# Patient Record
Sex: Male | Born: 1965 | Hispanic: No | Marital: Married | Smoking: Former smoker
Health system: Southern US, Community
[De-identification: ages and names within clinical notes are randomized; demographics above are authoritative.]

## PROBLEM LIST (undated history)

## (undated) DIAGNOSIS — Z9989 Dependence on other enabling machines and devices: Secondary | ICD-10-CM

## (undated) DIAGNOSIS — E079 Disorder of thyroid, unspecified: Secondary | ICD-10-CM

## (undated) DIAGNOSIS — I5032 Chronic diastolic (congestive) heart failure: Secondary | ICD-10-CM

## (undated) DIAGNOSIS — Z992 Dependence on renal dialysis: Secondary | ICD-10-CM

## (undated) DIAGNOSIS — E7800 Pure hypercholesterolemia, unspecified: Secondary | ICD-10-CM

## (undated) DIAGNOSIS — F32A Depression, unspecified: Secondary | ICD-10-CM

## (undated) DIAGNOSIS — N289 Disorder of kidney and ureter, unspecified: Secondary | ICD-10-CM

## (undated) DIAGNOSIS — N400 Enlarged prostate without lower urinary tract symptoms: Secondary | ICD-10-CM

## (undated) DIAGNOSIS — I25810 Atherosclerosis of coronary artery bypass graft(s) without angina pectoris: Secondary | ICD-10-CM

## (undated) DIAGNOSIS — I10 Essential (primary) hypertension: Secondary | ICD-10-CM

## (undated) DIAGNOSIS — E119 Type 2 diabetes mellitus without complications: Secondary | ICD-10-CM

## (undated) DIAGNOSIS — F329 Major depressive disorder, single episode, unspecified: Secondary | ICD-10-CM

## (undated) HISTORY — PX: CARDIAC CATHETERIZATION: SHX172

## (undated) HISTORY — PX: DIALYSIS FISTULA CREATION: SHX611

---

## 1998-06-28 ENCOUNTER — Emergency Department (HOSPITAL_COMMUNITY): Admission: EM | Admit: 1998-06-28 | Discharge: 1998-06-28 | Payer: Self-pay | Admitting: Family Medicine

## 2005-01-11 ENCOUNTER — Inpatient Hospital Stay: Payer: Self-pay | Admitting: Internal Medicine

## 2005-04-26 ENCOUNTER — Ambulatory Visit: Payer: Self-pay | Admitting: Nurse Practitioner

## 2007-10-03 ENCOUNTER — Ambulatory Visit: Payer: Self-pay | Admitting: *Deleted

## 2007-10-11 ENCOUNTER — Ambulatory Visit: Payer: Self-pay | Admitting: Internal Medicine

## 2010-07-26 ENCOUNTER — Inpatient Hospital Stay: Payer: Self-pay | Admitting: Internal Medicine

## 2010-11-17 ENCOUNTER — Ambulatory Visit: Payer: Self-pay

## 2010-11-20 ENCOUNTER — Ambulatory Visit: Payer: Self-pay

## 2010-12-17 ENCOUNTER — Ambulatory Visit: Payer: Self-pay

## 2010-12-30 ENCOUNTER — Emergency Department: Payer: Self-pay | Admitting: Emergency Medicine

## 2011-04-13 ENCOUNTER — Emergency Department: Payer: Self-pay | Admitting: Emergency Medicine

## 2011-05-07 ENCOUNTER — Inpatient Hospital Stay: Payer: Self-pay | Admitting: Internal Medicine

## 2011-05-17 ENCOUNTER — Inpatient Hospital Stay: Payer: Self-pay | Admitting: Internal Medicine

## 2011-10-26 ENCOUNTER — Other Ambulatory Visit: Payer: Self-pay | Admitting: *Deleted

## 2011-10-27 ENCOUNTER — Inpatient Hospital Stay: Payer: Self-pay | Admitting: Specialist

## 2011-10-27 LAB — CBC WITH DIFFERENTIAL/PLATELET
Basophil #: 0 10*3/uL (ref 0.0–0.1)
Eosinophil #: 0.9 10*3/uL — ABNORMAL HIGH (ref 0.0–0.7)
Eosinophil %: 7 %
HCT: 31.4 % — ABNORMAL LOW (ref 40.0–52.0)
HGB: 10.5 g/dL — ABNORMAL LOW (ref 13.0–18.0)
Lymphocyte #: 1 10*3/uL (ref 1.0–3.6)
MCH: 31.6 pg (ref 26.0–34.0)
MCV: 95 fL (ref 80–100)
Monocyte #: 0.8 10*3/uL — ABNORMAL HIGH (ref 0.0–0.7)
Neutrophil %: 78.5 %
Platelet: 201 10*3/uL (ref 150–440)
RBC: 3.32 10*6/uL — ABNORMAL LOW (ref 4.40–5.90)
RDW: 17.8 % — ABNORMAL HIGH (ref 11.5–14.5)

## 2011-10-27 LAB — COMPREHENSIVE METABOLIC PANEL
Albumin: 3.6 g/dL (ref 3.4–5.0)
Alkaline Phosphatase: 86 U/L (ref 50–136)
Anion Gap: 18 — ABNORMAL HIGH (ref 7–16)
BUN: 79 mg/dL — ABNORMAL HIGH (ref 7–18)
Bilirubin,Total: 0.8 mg/dL (ref 0.2–1.0)
Creatinine: 10.69 mg/dL — ABNORMAL HIGH (ref 0.60–1.30)
EGFR (African American): 7 — ABNORMAL LOW
EGFR (Non-African Amer.): 6 — ABNORMAL LOW
Glucose: 122 mg/dL — ABNORMAL HIGH (ref 65–99)
Osmolality: 301 (ref 275–301)
SGPT (ALT): 19 U/L
Sodium: 138 mmol/L (ref 136–145)
Total Protein: 8.2 g/dL (ref 6.4–8.2)

## 2011-10-27 LAB — PROTIME-INR: INR: 1.2

## 2011-10-27 LAB — CK TOTAL AND CKMB (NOT AT ARMC): CK-MB: 9.1 ng/mL — ABNORMAL HIGH (ref 0.5–3.6)

## 2011-10-27 LAB — TROPONIN I: Troponin-I: <0.02 ng/mL

## 2011-10-27 LAB — POTASSIUM: Potassium: 3.5 mmol/L (ref 3.5–5.1)

## 2011-10-28 LAB — CBC WITH DIFFERENTIAL/PLATELET
Basophil #: 0 10*3/uL (ref 0.0–0.1)
Basophil %: 0.4 %
Eosinophil #: 0.5 10*3/uL (ref 0.0–0.7)
Eosinophil %: 6.7 %
HCT: 28 % — ABNORMAL LOW (ref 40.0–52.0)
HGB: 9.3 g/dL — ABNORMAL LOW (ref 13.0–18.0)
Lymphocyte #: 1.4 10*3/uL (ref 1.0–3.6)
Lymphocyte %: 17 %
MCH: 31.4 pg (ref 26.0–34.0)
MCHC: 33.1 g/dL (ref 32.0–36.0)
MCV: 95 fL (ref 80–100)
Monocyte #: 0.7 10*3/uL (ref 0.0–0.7)
Monocyte %: 9.1 %
Neutrophil #: 5.4 10*3/uL (ref 1.4–6.5)
Neutrophil %: 66.8 %
Platelet: 178 10*3/uL (ref 150–440)
RBC: 2.96 10*6/uL — ABNORMAL LOW (ref 4.40–5.90)
RDW: 17 % — ABNORMAL HIGH (ref 11.5–14.5)
WBC: 8 10*3/uL (ref 3.8–10.6)

## 2011-10-28 LAB — HEMOGLOBIN: HGB: 9.3 g/dL — ABNORMAL LOW (ref 13.0–18.0)

## 2011-10-28 LAB — BASIC METABOLIC PANEL
Anion Gap: 12 (ref 7–16)
BUN: 35 mg/dL — ABNORMAL HIGH (ref 7–18)
Calcium, Total: 7.8 mg/dL — ABNORMAL LOW (ref 8.5–10.1)
Co2: 30 mmol/L (ref 21–32)
EGFR (Non-African Amer.): 10 — ABNORMAL LOW
Glucose: 50 mg/dL — ABNORMAL LOW (ref 65–99)
Osmolality: 287 (ref 275–301)
Potassium: 5.1 mmol/L (ref 3.5–5.1)

## 2011-10-28 LAB — MAGNESIUM: Magnesium: 2.4 mg/dL

## 2011-11-01 LAB — CULTURE, BLOOD (SINGLE)

## 2011-11-15 ENCOUNTER — Encounter: Payer: Self-pay | Admitting: Family Medicine

## 2011-12-06 ENCOUNTER — Encounter: Payer: Self-pay | Admitting: Family Medicine

## 2011-12-28 ENCOUNTER — Emergency Department: Payer: Self-pay | Admitting: Emergency Medicine

## 2011-12-28 LAB — CBC WITH DIFFERENTIAL/PLATELET
Basophil #: 0 10*3/uL (ref 0.0–0.1)
Basophil %: 0.5 %
Eosinophil %: 11.4 %
MCH: 31.9 pg (ref 26.0–34.0)
MCV: 94 fL (ref 80–100)
Monocyte #: 0.5 x10 3/mm (ref 0.2–1.0)
Neutrophil %: 70.1 %
WBC: 7.1 10*3/uL (ref 3.8–10.6)

## 2011-12-28 LAB — BASIC METABOLIC PANEL
Anion Gap: 9 (ref 7–16)
Co2: 31 mmol/L (ref 21–32)
Glucose: 152 mg/dL — ABNORMAL HIGH (ref 65–99)
Potassium: 3.8 mmol/L (ref 3.5–5.1)
Sodium: 138 mmol/L (ref 136–145)

## 2012-09-28 ENCOUNTER — Emergency Department: Payer: Self-pay | Admitting: Emergency Medicine

## 2012-09-28 LAB — COMPREHENSIVE METABOLIC PANEL
Albumin: 4 g/dL (ref 3.4–5.0)
Alkaline Phosphatase: 131 U/L (ref 50–136)
Anion Gap: 11 (ref 7–16)
BUN: 58 mg/dL — ABNORMAL HIGH (ref 7–18)
Calcium, Total: 8.5 mg/dL (ref 8.5–10.1)
Chloride: 100 mmol/L (ref 98–107)
Co2: 26 mmol/L (ref 21–32)
EGFR (African American): 7 — ABNORMAL LOW
Glucose: 291 mg/dL — ABNORMAL HIGH (ref 65–99)
SGOT(AST): 15 U/L (ref 15–37)
SGPT (ALT): 14 U/L (ref 12–78)
Sodium: 137 mmol/L (ref 136–145)
Total Protein: 8.1 g/dL (ref 6.4–8.2)

## 2012-09-28 LAB — CBC WITH DIFFERENTIAL/PLATELET
Basophil #: 0.1 10*3/uL (ref 0.0–0.1)
Basophil %: 0.8 %
HGB: 10.5 g/dL — ABNORMAL LOW (ref 13.0–18.0)
Lymphocyte #: 1.4 10*3/uL (ref 1.0–3.6)
MCH: 33.9 pg (ref 26.0–34.0)
MCHC: 35.3 g/dL (ref 32.0–36.0)
MCV: 96 fL (ref 80–100)
Monocyte #: 0.8 x10 3/mm (ref 0.2–1.0)
RDW: 14.9 % — ABNORMAL HIGH (ref 11.5–14.5)
WBC: 11.8 10*3/uL — ABNORMAL HIGH (ref 3.8–10.6)

## 2012-10-04 LAB — CULTURE, BLOOD (SINGLE)

## 2013-09-21 ENCOUNTER — Emergency Department: Payer: Self-pay | Admitting: Emergency Medicine

## 2013-09-21 LAB — TSH: Thyroid Stimulating Horm: 2.46 u[IU]/mL

## 2013-09-21 LAB — SALICYLATE LEVEL

## 2013-09-21 LAB — CBC
HCT: 34.9 % — ABNORMAL LOW (ref 40.0–52.0)
HGB: 12.3 g/dL — AB (ref 13.0–18.0)
MCH: 33 pg (ref 26.0–34.0)
MCHC: 35.4 g/dL (ref 32.0–36.0)
MCV: 93 fL (ref 80–100)
PLATELETS: 166 10*3/uL (ref 150–440)
RBC: 3.74 10*6/uL — AB (ref 4.40–5.90)
RDW: 15.2 % — AB (ref 11.5–14.5)
WBC: 8.3 10*3/uL (ref 3.8–10.6)

## 2013-09-21 LAB — COMPREHENSIVE METABOLIC PANEL
ALT: 26 U/L (ref 12–78)
ANION GAP: 5 — AB (ref 7–16)
Albumin: 4.4 g/dL (ref 3.4–5.0)
Alkaline Phosphatase: 130 U/L — ABNORMAL HIGH
BILIRUBIN TOTAL: 0.6 mg/dL (ref 0.2–1.0)
BUN: 30 mg/dL — ABNORMAL HIGH (ref 7–18)
Calcium, Total: 9 mg/dL (ref 8.5–10.1)
Chloride: 88 mmol/L — ABNORMAL LOW (ref 98–107)
Co2: 31 mmol/L (ref 21–32)
Creatinine: 6.49 mg/dL — ABNORMAL HIGH (ref 0.60–1.30)
GFR CALC AF AMER: 11 — AB
GFR CALC NON AF AMER: 9 — AB
Glucose: 345 mg/dL — ABNORMAL HIGH (ref 65–99)
Osmolality: 270 (ref 275–301)
Potassium: 4.9 mmol/L (ref 3.5–5.1)
SGOT(AST): 25 U/L (ref 15–37)
Sodium: 124 mmol/L — ABNORMAL LOW (ref 136–145)
TOTAL PROTEIN: 8.8 g/dL — AB (ref 6.4–8.2)

## 2013-09-21 LAB — ETHANOL: Ethanol %: <0.003 % (ref 0.000–0.080)

## 2013-09-21 LAB — ACETAMINOPHEN LEVEL

## 2014-12-28 NOTE — Consult Note (Signed)
Brief Consult Note: Diagnosis: Depression not otherwise specified, adjustment disorder with depressed mood.   Patient was seen by consultant.   Consult note dictated.   Discussed with Attending MD.   Comments: Psychiatry: Patient seen and interviewed with the assistance of a interpreter. Chart reviewed. 49 year old man with multiple chronic medical problems referred from his primary care doctor because of depression and concerns about suicidal ideation. Patient has symptoms of depression but denies any current suicidal ideation or wish to die. Has positive things in his life to live for and also has religious reasons not to kill himself. Appears to be lucid and able to make reasonable decisions. I do not think she meets commitment criteria or requires inpatient hospitalization. I have advised him to continue taking the prescribed fluoxetine. I have advised him to continue talking with his primary care doctor about his depression. I have advised him to try to find some things that he can do to keep himself more busy and feel more productive. Patient is agreeable to this and reports that he is feeling better just from talking about it. Full note dictated. Involuntary commitment papers terminated. Case discussed with emergency room physician.  Electronic Signatures: Audery Amellapacs, Aailyah Dunbar T (MD)  (Signed 17-Jan-15 10:15)  Authored: Brief Consult Note   Last Updated: 17-Jan-15 10:15 by Audery Amellapacs, Lorelie Biermann T (MD)

## 2014-12-29 NOTE — Discharge Summary (Signed)
PATIENT NAME:  Marcus [name], Marcus [name] MR#:  [id number] DATE OF BIRTH:  [birth year]  DATE OF ADMISSION:  10/27/2011 DATE OF DISCHARGE:  10/28/2011  For a detailed note, please take a look at the history and physical done on admission by Dr. Margie Ege.   DIAGNOSES AT DISCHARGE:  1. Acute respiratory failure secondary to volume overload and acute pulmonary edema from missing a hemodialysis session.  2. Hyperkalemia also result of missing hemodialysis.  3. End-stage renal disease, on hemodialysis Tuesday, Thursday, and Saturday. 4. Hypothyroidism. 5. Hypertension. 6. Hyperlipidemia. 7. Diabetes.   DIET: The patient was discharged on a low sodium, American Diabetic Association low fat diet.   ACTIVITY: As tolerated.   FOLLOW-UP: Follow-up with Dr. Mosetta Pigeon in the next 1 to 2 weeks.   DISCHARGE MEDICATIONS:  1. Lisinopril 10 mg daily.  2. Lovastatin 40 mg daily.  3. Nexium 20 mg daily.  4. Synthroid 100 mcg daily.  5. Metoprolol tartrate 100 mg b.i.d.  6. Renagel 800 mg 2 tabs t.i.d.  7. Plavix 75 mg daily.  8. Calcium carbonate 500 mg 1 tab t.i.d.  9. Aspirin 81 mg daily.  10. Lorazepam 0.5 mg at bedtime.  11. Amlodipine 10 mg daily.  12. Lantus 10 units at bedtime.   CONSULTANTS DURING THE HOSPITAL COURSE:  1. Dr. Mosetta Pigeon from Nephrology  2. Dr. Meredeth Ide from Pulmonary   3. Dr. Festus Barren from Vascular Surgery    PERTINENT STUDIES DONE DURING THE HOSPITAL COURSE: Chest x-ray done on admission showing early findings suggestive of CHF and pulmonary edema.   HOSPITAL COURSE: This is a 49 year old Hispanic male who has a past medical history as mentioned above who presented to the hospital with shortness of breath and weakness and noted to be hyperkalemic and volume overloaded.  1. Acute respiratory failure. This was secondary to pulmonary edema and volume overload and that was a result of the patient missing his hemodialysis session. The patient misses hemodialysis because when  they accessed his fistula it was infiltrated. The patient apparently had had an angiogram done to the fistula at [facility] along with a stent placement only a few days ago. The patient was brought to the hospital. During this hospital course they were able to access the fistula in the left upper extremity and the patient underwent a successful dialysis. His shortness of breath and congestion significantly improved after dialysis. Currently he is back down to room air oxygen, was ambulated with no evidence of any desaturations and is, therefore, being discharged home.  2. Hyperkalemia. This was also a result of the patient missing hemodialysis. He presented to the hospital with a potassium of 7.5. His EKG did show some peak T waves. He was given some insulin, D50, and calcium in the ER, although after receiving his hemodialysis the patient's potassium level has normalized and is at baseline now. His ACE inhibitor was held due to his hyperkalemia, although he can resume that upon discharge.  3. Hypertension. As mentioned, the patient ACE inhibitor was held due to hyperkalemia, although the patient will resume his normal meds including lisinopril, metoprolol, and amlodipine as stated.  4. Hypothyroidism. The patient was maintained on Synthroid. He will resume that.  5. Hyperlipidemia. The patient was maintained on his lovastatin. He will resume that.  6. Diabetes. The patient was maintained on his Lantus and sliding scale insulin. He will resume that upon discharge too.   CODE STATUS: The patient is a FULL CODE.      TIME SPENT:  35 minutes.  ____________________________ Rolly PancakeVivek J. Cherlynn KaiserSainani, MD vjs:drc D: 10/28/2011 14:51:21 ET T: 10/29/2011 08:03:32 ET JOB#: [id number]  cc: Rolly PancakeVivek J. Cherlynn KaiserSainani, MD, <Dictator> Mosetta PigeonHarmeet Singh, MD Houston SirenVIVEK J SAINANI MD ELECTRONICALLY SIGNED 10/29/2011 16:11

## 2014-12-29 NOTE — Consult Note (Signed)
Patient with ESRD, had difficulty with access of his left radiocephalic AVF as an outpatient.  This has been [facility] times previously. He was able to use this for dialysis today, and there is a good thrill near the anastomosis and a weeker thrill nearer to the [facility] fossa.  Would continue to use the access as needed for his regular dialysis.  Will need either a fistulagram (if not able to get good dialysis) or an outpatient duplex (if it works well here) for further evaluation.     Electronic Signatures: Annice Needyew, Jason S (MD)  (Signed on 20-Feb-13 15:36)  Authored  Last Updated: 20-Feb-13 15:36 by Annice Needyew, Jason S (MD)

## 2014-12-29 NOTE — H&P (Signed)
PATIENT NAME:  Marcus [name], Marcus [name] MR#:  [id number] DATE OF BIRTH:  [birth year]  DATE OF ADMISSION:  10/27/2011  REFERRING PHYSICIAN: Dr. Manson Passey    PRIMARY CARE PHYSICIAN: Dr. Darreld Mclean   PRIMARY NEPHROLOGIST: Dr. Molli Barrows at [facility]    PRESENTING COMPLAINT: Shortness of breath, clear sputum, productive cough, chest tightness.   HISTORY OF PRESENT ILLNESS: Marcus [name] is a pleasant 49 year old gentleman with history of diabetes, hypertension, end-stage renal disease, hypothyroidism, coronary artery disease status post stent who presented with acute onset of cough with clear sputum around 9 p.m. last night followed by persistent sputum production and difficulty breathing at rest and on exertion with some chest tightness prior to the ambulance arriving. He was found to have oxygen sats of 86% on room air when EMS arrived. The patient reports last Tuesday he developed some arm pain and swelling. He was seen at dialysis that day and had his dialysis, then by Thursday, his next dialysis session, his symptoms had worsened but they proceeded to do dialysis and scheduled for appointment with [facility] on Friday to get his graft evaluated. The patient presented for procedure on Friday but was not instructed about oral intake and he ate, therefore, procedure was aborted. He then presented to [facility] ER with worsening symptoms of his pain and swelling. Denies any bleeding or drainage. He apparently got evaluated and was able to get dialysis on Saturday. On Monday he presented to [facility], had his graft accessed and intervened and was instructed to follow-up for dialysis on Tuesday. Then on Tuesday the patient presented for dialysis but had the same issues with difficulty with access and was then advised to again follow-up with [facility] for a re-evaluation. Therefore, the patient did not get his dialysis yesterday on Tuesday. He currently reports shortness of breath is improved. No chest pain. He  reports his face and hands have been swelling but no lower extremity swelling. He endorses orthopnea and PND. Denies any palpitations, presyncope, or syncope. No fevers, chills, nausea, or vomiting. Onset of cough was acute in nature.   PAST MEDICAL HISTORY:  1. Diabetes.  2. Hypertension.  3. Gastroesophageal reflux disease.  4. End-stage renal disease on hemodialysis via left upper extremity graft at [facility] Tuesday, Thursday, and Saturday since 2012. He was diagnosed with kidney failure in November 2011.  5. Depression/anxiety, still needing evaluation and treatment.  6. Coronary artery disease, status post stent.  7. Hypothyroidism.  8. Benign prostatic hypertrophy.  9. Between 09/10 to 05/18/2011 the patient was admitted for chest pain work-up thought to be related to malignant hypertension. He had an echocardiogram on 05/17/2011 that showed normal EF of 50%, mild to moderate mitral regurgitation, and pleural effusion was noted at that time.   10. History of seizure as a complication of hospital stay from medications at [facility]. This is per patient and family.   PAST SURGICAL HISTORY:  1. Prostate abscess, status post TURP.  2. Left ankle surgery.  3. Left upper extremity graft in 2012.  4. Cardiac catheterization.   ALLERGIES: Seafood.   MEDICATIONS:  1. Lisinopril 10 mg daily.  2. Metoprolol 100 mg b.i.d.  3. Levothyroxine 100 mcg daily.  4. Amlodipine 10 mg daily.  5. Lantus 10 units at bedtime.  6. Lovastatin 40 mg daily.  7. Nexium 40 mg daily.  8. Aspirin 81 mg daily.  9. Plavix 75 mg daily.  10. Lorazepam 0.5 mg at bedtime.  11. Renagel 800 mg t.i.d. with meals.  SOCIAL HISTORY: He lives in [place]. He is disabled on dialysis. He quit tobacco 1-1/2 years ago. No alcohol. He was previously in [employer] work and originally is from [place]. Denies any drug use.   FAMILY HISTORY: Mother died of stroke at 53, also with hypertension, diabetes; same thing with father, died  of stroke at 22, also with hypertension and diabetes.  REVIEW OF SYSTEMS: CONSTITUTIONAL: No fevers, chills, nausea, or vomiting. EYES: He has been having some blurry vision for over a year now. ENT: No epistaxis, discharge, or dysphagia. RESPIRATORY: As per history of present illness. CARDIOVASCULAR: He reports chest tightness after the shortness of breath and endorses orthopnea. Reports facial and hand edema but no worsening lower extremity edema. No palpitations or syncope. GI: No nausea, vomiting, diarrhea, abdominal pain, hematemesis. GU: He makes no urine. ENDOCRINE: No polydipsia. HEME: No easy bleeding. SKIN: He denies any bleeding from his graft site. MUSCULOSKELETAL: Reports leg pain and left upper extremity pain. NEUROLOGIC: No history of a stroke. PSYCH: Endorses depression and anxiety. Is still following up with psychiatry outpatient for medication regimen.   PHYSICAL EXAMINATION:   VITAL SIGNS: Temperature 97.4, pulse 83, respiratory rate 18, blood pressure 159/71, sating at 100% on 2 liters.   GENERAL: Lying in bed in no apparent distress.   HEENT: Normocephalic, atraumatic. Pupils equal, symmetric. Nasal cannula in place. Moist mucous membrane.   NECK: Soft and supple. No adenopathy. He has elevated JVP of approximately 9 cm.   CARDIOVASCULAR: Non-tachy. No murmurs.   LUNGS: Decreased breath sounds bilaterally with faint crackles. No use of accessory muscles or increased respiratory effort.   ABDOMEN: Soft. Positive bowel sounds. No mass appreciated.   EXTREMITIES: Trace edema bilaterally in lower extremity. He has edema of his right hand and also some swelling of the left forearm around his left upper extremity graft. His graft has a good bruit.   NEUROLOGIC: Symmetrical strength. No focal deficits.   PSYCH: He is alert and oriented.   PERTINENT LABS AND STUDIES: Potassium 8.3. CK 407. MB 9.1. Troponin less than 0.02. WBC 12.7, hemoglobin 10.5, hematocrit 31.4, platelet  201, MCV 95, glucose 122, BUN 79, creatinine 10.69, sodium 138, potassium 7.5, chloride 102, carbon dioxide 18, calcium 7.5. LFTs within normal limits. INR 1.2. EKG with normal sinus rate of 85, peak T waves and prolonged QT. Chest x-ray with bilateral pleural effusions, right greater than left, and some pulmonary edema. I do not suspect that there is underlying infiltrate. Official chest x-ray read is pending.   ASSESSMENT AND PLAN: Mr. Tillett is a 49 year old gentleman with history of end-stage renal disease on hemodialysis since 2012 via left upper extremity graft, hypertension, hypothyroidism, diabetes, coronary artery disease status post questionable stent presenting with acute onset of productive cough with clear sputum, shortness of breath, and chest tightness.  1. Acute hypoxic respiratory failure in the setting of pulmonary edema, effusion, volume overload with missed dialysis due to nonfunctioning graft. Chest x-ray results as above. Based on history and labs, low suspicion for underlying infection. At this point hold off on continuing antibiotics. Will plan for dialysis. Repeat chest x-ray post dialysis. Will get Nephrology consult and Vascular consult. Per history it sounds like he's had a prior diagnosis of pleural effusions at [facility] requiring chest tube for drainage. Will get [facility] records as the patient has had multiple changes in his medical history compared to our records. Will also obtain Pulmonary consultation to further evaluate and make recommendations for the effusion if dialysis is  not helpful. Will send patient to the CCU. Cycle cardiac enzymes. Obtain an echocardiogram. His echo from 05/2011 is as dictated above.  2. Hyperkalemia, end-stage renal disease as above. He has received insulin, bicarb, albuterol, D50, and calcium chloride in the ED. EKG with changes. Dr. Thedore MinsSingh is aware. Plan for emergent dialysis. Restart Renagel.  3. Hypertension, worsening hypertension during evaluation. Hold  lisinopril with elevated potassium. Restart metoprolol and amlodipine. Use hydralazine as needed.  4. Coronary artery disease with probable stent placement. Restart aspirin and Plavix.  5. Depression/anxiety. Scheduled to follow-up with Psych. Restart his alprazolam.  6. Diabetes. Start sliding scale insulin and resume his Lantus.  7. Prophylaxis with heparin sub-Q, aspirin, and Nexium.      TIME SPENT: Approximately 55 minutes spent on patient care. There was an interpreter present during the entire interview for explanation to patient and family.    ____________________________ Reuel DerbyAlounthith Luvern Mischke, MD ap:drc D: 10/27/2011 07:51:20 ET T: 10/27/2011 08:18:44 ET JOB#: [id number]  cc: Reuel DerbyAlounthith Devi Hopman, MD, <Dictator> Leanna SatoLinda M. Miles, MD Reuel DerbyALOUNTHITH Taquan Bralley MD ELECTRONICALLY SIGNED 11/02/2011 0:55

## 2014-12-29 NOTE — Consult Note (Signed)
PATIENT NAME:  Marcus [name], Marcus [name] MR#:  [id number] DATE OF BIRTH:  08/18/1966  DATE OF CONSULTATION:  10/27/2011  REFERRING PHYSICIAN:  Not dictated CONSULTING PHYSICIAN:  Annice NeedyJason S. Dew, MD  REASON FOR CONSULTATION: Evaluation of fistula.   HISTORY OF PRESENT ILLNESS: This is a 49 year old Hispanic male who was admitted earlier today with shortness of breath and chest tightness. He has endstage renal disease. He has a left radiocephalic AV fistula, which apparently has not been usable for dialysis at an outpatient center, and this was one of the reasons he was admitted to the hospital. The fistula is patent with a good thrill particularly in the distal forearm, but the thrill is decreased near the antecubital fossa and asking the patient, this apparently has been [facility] at [facility] times, but I do not have those records in front of me.  He missed his normal dialysis the day prior and it has been several days since his last treatment, which is likely causing his shortness of breath and chest tightness.  PAST MEDICAL HISTORY: 1. Diabetes.  2. Hypertension.  3. Gastroesophageal reflux disease.  4. Endstage renal disease.  5. Anxiety, depression.  6. Coronary disease.  7. Hypothyroidism.  8. Benign prostatic hypertrophy.   PAST SURGICAL HISTORY:  1. Transurethral resection of the prostate.  2. Left ankle surgery.  3. Left arm access.  4. Cardiac catheterization.   ALLERGIES: Seafood.   HOME MEDICATIONS:  1. Lisinopril 10 mg daily.  2. Metoprolol 100 mg b.i.d. 3. Levothyroxine 100 mcg daily.  4. Amlodipine 10 mg daily.  5. Lantus 10 units at bedtime.  6. Lovastatin 40 mg daily.  7. Nexium 40 mg daily.  8. Aspirin 81 mg daily.  9. Plavix 75 mg daily.  10. Lorazepam 0.5 mg at bedtime.  11. Renagel with meals.   SOCIAL HISTORY: [place] in [place]. He is disabled on dialysis. He quit tobacco a year or so ago. He is from [place].  FAMILY HISTORY:  Mother died of a stroke in  her 10380s. Father died of a stroke in his 2770s.   REVIEW OF SYSTEMS: CONSTITUTIONAL: No fevers or chills. No intentional weight loss or gain. EYES: Chronic blurry vision. No recent changes. EARS: No tinnitus or ear pain.  CARDIOVASCULAR: His chest tightness is improved from admission but he still has some shortness of breath with minimal activity. RESPIRATIONS: As per history of present illness.  GASTROINTESTINAL: No nausea, vomiting, or diarrhea. GENITOURINARY: No urine.  ENDOCRINE: No heat or cold intolerance. HEME: No anemia or easy bruising. SKIN: No new rash or ulcers. MUSCULOSKELETAL: Some chronic leg pain. NEUROLOGIC: No history of transient ischemic attack or stroke. There is some report of seizure in the past due to medications.  PSYCH: Positive for anxiety.   PHYSICAL EXAMINATION:  GENERAL: This is a well-developed, well-nourished Hispanic male not in apparent distress.   VITAL SIGNS: Temperature 98.6, pulse 92, blood pressure 178/66, saturations are 92% on 3 liters nasal cannula.   HEENT: Normocephalic and atraumatic.   EYES: Sclerae anicteric. Conjunctivae are clear.   EARS: Normal external appearance. Hearing is intact.   NECK: Supple without adenopathy or jugular venous distention.   HEART: A bit tachycardic but regular.   LUNGS: Somewhat diminished in the bases bilaterally.   ABDOMEN: Soft, nondistended, nontender.   EXTREMITIES: He has a left radiocephalic AV fistula with a good thrill in the distal forearm. The thrill does decrease somewhat near the antecubital fossa but is present. The fistula is visible.  NEUROLOGIC: Normal strength and tone in all four extremities.   SKIN: Warm and dry.   LABORATORY EVALUATION:   Sodium 138. He had a potassium of 7.5 on admission but it is 4.6 now. Chloride 102, CO2 18, BUN 79, creatinine is 10.69, glucose 122, white blood cell count is 12.7, hemoglobin 10.5, platelet count 201,000.   ASSESSMENT AND PLAN: This is a 49 year old  Hispanic male with endstage renal disease. I would go ahead and try to use his fistula for dialysis. It should be functional. If it is not we will place a catheter if needed. A fistulogram may be required as well to assess this if it cannot be used. If it can be used, I would set up for an outpatient duplex followup. The patient voices his understanding. This is a level-4 consultation.      ____________________________ Annice Needy, MD jsd:bjt D: 11/17/2011 10:46:24 ET T: 11/17/2011 13:36:11 ET JOB#: [id number]  cc: Annice Needy, MD, <Dictator> Annice Needy MD ELECTRONICALLY SIGNED 11/26/2011 17:19

## 2015-09-09 ENCOUNTER — Emergency Department: Payer: Self-pay

## 2015-09-09 ENCOUNTER — Emergency Department
Admission: EM | Admit: 2015-09-09 | Discharge: 2015-09-09 | Disposition: A | Discharge status: Home / Self Care | Payer: Self-pay | Attending: Emergency Medicine | Admitting: Emergency Medicine

## 2015-09-09 ENCOUNTER — Encounter: Payer: Self-pay | Admitting: Emergency Medicine

## 2015-09-09 DIAGNOSIS — E119 Type 2 diabetes mellitus without complications: Secondary | ICD-10-CM | POA: Insufficient documentation

## 2015-09-09 DIAGNOSIS — I120 Hypertensive chronic kidney disease with stage 5 chronic kidney disease or end stage renal disease: Secondary | ICD-10-CM | POA: Insufficient documentation

## 2015-09-09 DIAGNOSIS — N186 End stage renal disease: Secondary | ICD-10-CM | POA: Insufficient documentation

## 2015-09-09 DIAGNOSIS — H8110 Benign paroxysmal vertigo, unspecified ear: Secondary | ICD-10-CM | POA: Insufficient documentation

## 2015-09-09 DIAGNOSIS — F17200 Nicotine dependence, unspecified, uncomplicated: Secondary | ICD-10-CM | POA: Insufficient documentation

## 2015-09-09 DIAGNOSIS — R42 Dizziness and giddiness: Secondary | ICD-10-CM

## 2015-09-09 DIAGNOSIS — Z992 Dependence on renal dialysis: Secondary | ICD-10-CM | POA: Insufficient documentation

## 2015-09-09 HISTORY — DX: Disorder of thyroid, unspecified: E07.9

## 2015-09-09 HISTORY — DX: Dependence on renal dialysis: Z99.2

## 2015-09-09 HISTORY — DX: Disorder of kidney and ureter, unspecified: N28.9

## 2015-09-09 HISTORY — DX: Pure hypercholesterolemia, unspecified: E78.00

## 2015-09-09 HISTORY — DX: Major depressive disorder, single episode, unspecified: F32.9

## 2015-09-09 HISTORY — DX: Benign prostatic hyperplasia without lower urinary tract symptoms: N40.0

## 2015-09-09 HISTORY — DX: Type 2 diabetes mellitus without complications: E11.9

## 2015-09-09 HISTORY — DX: Essential (primary) hypertension: I10

## 2015-09-09 HISTORY — DX: Depression, unspecified: F32.A

## 2015-09-09 LAB — CBC
HEMATOCRIT: 30.2 % — AB (ref 40.0–52.0)
HEMOGLOBIN: 10.6 g/dL — AB (ref 13.0–18.0)
MCH: 32.9 pg (ref 26.0–34.0)
MCHC: 35.1 g/dL (ref 32.0–36.0)
MCV: 93.7 fL (ref 80.0–100.0)
Platelets: 162 10*3/uL (ref 150–440)
RBC: 3.22 MIL/uL — AB (ref 4.40–5.90)
RDW: 16.8 % — ABNORMAL HIGH (ref 11.5–14.5)
WBC: 10.2 10*3/uL (ref 3.8–10.6)

## 2015-09-09 LAB — BASIC METABOLIC PANEL
Anion gap: 10 (ref 5–15)
BUN: 19 mg/dL (ref 6–20)
CHLORIDE: 93 mmol/L — AB (ref 101–111)
CO2: 31 mmol/L (ref 22–32)
Calcium: 8.3 mg/dL — ABNORMAL LOW (ref 8.9–10.3)
Creatinine, Ser: 4.26 mg/dL — ABNORMAL HIGH (ref 0.61–1.24)
GFR calc non Af Amer: 15 mL/min — ABNORMAL LOW (ref >60)
GFR, EST AFRICAN AMERICAN: 17 mL/min — AB (ref >60)
Glucose, Bld: 162 mg/dL — ABNORMAL HIGH (ref 65–99)
POTASSIUM: 3.9 mmol/L (ref 3.5–5.1)
SODIUM: 134 mmol/L — AB (ref 135–145)

## 2015-09-09 NOTE — ED Provider Notes (Signed)
[facility] Emergency Department Provider Note  ____________________________________________  Time seen: 10:40 AM  I have reviewed the triage vital signs and the nursing notes.   HISTORY  Chief Complaint Near Syncope    HPI Marcus [name] is a 50 y.o. male sent to the ED from dialysis due to nearly passing out. He reports being in his usual state of health without any acute symptoms. Hematologic his usual routine dialysis session today, and after he had had 4.5 L of fluid removed out of his usual 6, he got up to go to the bathroom and felt very dizzy. He almost passed out but did not, denies any fall or head injury. Early feels back to normal. He had dizziness that lasted for just a few minutes but was transported to the emergency department for evaluation.     Past Medical History  Diagnosis Date  . Dialysis patient (HCC)   . Renal disorder   . Hypertension   . Diabetes mellitus without complication (HCC)   . Thyroid disease   . CHF (congestive heart failure) (HCC)   . Depression   . BPH (benign prostatic hyperplasia)   . High cholesterol      There are no active problems to display for this patient.    No past surgical history on file.   No current outpatient prescriptions on file.   Allergies Review of patient's allergies indicates no known allergies.   No family history on file.  Social History Social History  Substance Use Topics  . Smoking status: Current Every Day Smoker  . Smokeless tobacco: None  . Alcohol Use: None    Review of Systems  Constitutional:   No fever or chills. No weight changes Eyes:   No blurry vision or double vision.  ENT:   No sore throat. Cardiovascular:   No chest pain. Respiratory:   No dyspnea or cough. Gastrointestinal:   Negative for abdominal pain, vomiting and diarrhea.  No BRBPR or melena. Genitourinary:   Negative for dysuria, urinary retention, bloody urine, or difficulty  urinating. Musculoskeletal:   Negative for back pain. No joint swelling or pain. Skin:   Negative for rash. Neurological:   Negative for headaches, focal weakness or numbness. Psychiatric:  No anxiety or depression.   Endocrine:  No hot/cold intolerance, changes in energy, or sleep difficulty.  10-point ROS otherwise negative.  ____________________________________________   PHYSICAL EXAM:  VITAL SIGNS: ED Triage Vitals  Enc Vitals Group     BP 09/09/15 1040 157/77 mmHg     Pulse Rate 09/09/15 1040 73     Resp 09/09/15 1040 18     Temp 09/09/15 1040 98.6 F (37 C)     Temp Source 09/09/15 1040 Oral     SpO2 09/09/15 1039 100 %     Weight --      Height --      Head Cir --      Peak Flow --      Pain Score --      Pain Loc --      Pain Edu? --      Excl. in GC? --     Vital signs reviewed, nursing assessments reviewed.   Constitutional:   Alert and oriented. Well appearing and in no distress. Eyes:   No scleral icterus. No conjunctival pallor. PERRL. EOMI ENT   Head:   Normocephalic and atraumatic.   Nose:   No congestion/rhinnorhea. No septal hematoma   Mouth/Throat:   MMM, no  pharyngeal erythema. No peritonsillar mass. No uvula shift.   Neck:   No stridor. No SubQ emphysema. No meningismus. Hematological/Lymphatic/Immunilogical:   No cervical lymphadenopathy. Cardiovascular:   RRR. Normal and symmetric distal pulses are present in all extremities. No murmurs, rubs, or gallops. Respiratory:   Normal respiratory effort without tachypnea nor retractions. Breath sounds are clear and equal bilaterally. No wheezes/rales/rhonchi. Gastrointestinal:   Soft and nontender. No distention. There is no CVA tenderness.  No rebound, rigidity, or guarding. Genitourinary:   deferred Musculoskeletal:   Nontender with normal range of motion in all extremities. No joint effusions.  No lower extremity tenderness.  No edema. Left forearm fistula with strong thrill Neurologic:    Normal speech and language.  CN 2-10 normal. Motor grossly intact.  Normal gait. No gross focal neurologic deficits are appreciated.  Skin:    Skin is warm, dry and intact. No rash noted.  No petechiae, purpura, or bullae. Psychiatric:   Mood and affect are normal. Speech and behavior are normal. Patient exhibits appropriate insight and judgment.  ____________________________________________    LABS (pertinent positives/negatives) (all labs ordered are listed, but only abnormal results are displayed) Labs Reviewed  BASIC METABOLIC PANEL - Abnormal; Notable for the following:    Sodium 134 (*)    Chloride 93 (*)    Glucose, Bld 162 (*)    Creatinine, Ser 4.26 (*)    Calcium 8.3 (*)    GFR calc non Af Amer 15 (*)    GFR calc Af Amer 17 (*)    All other components within normal limits  CBC - Abnormal; Notable for the following:    RBC 3.22 (*)    Hemoglobin 10.6 (*)    HCT 30.2 (*)    RDW 16.8 (*)    All other components within normal limits   ____________________________________________   EKG  Interpreted by me Sinus rhythm rate of 73, normal axis and intervals. Normal QRS ST segments and T waves  ____________________________________________    RADIOLOGY  Chest x-ray unremarkable  ____________________________________________   PROCEDURES   ____________________________________________   INITIAL IMPRESSION / ASSESSMENT AND PLAN / ED COURSE  Pertinent labs & imaging results that were available during my care of the patient were reviewed by me and considered in my medical decision making (see chart for details).  Patient presents with transient dizziness related to change in position in the midst of dialysis when he had nearly 5 L of fluid acutely removed from his vasculature. Denies any other preceding symptoms, rapidly returned to baseline. No traumatic injury during the event. Likely this is due to transient fluid shifts and dialysis disequilibrium. There  are no symptoms to suggest any other acute pathology. We'll check labs and chest x-ray as a screening measure.  ----------------------------------------- 1:07 PM on 09/09/2015 -----------------------------------------  Workup negative. Patient still asymptomatic in the emergency department with unremarkable vital signs. We will discharge home and have him follow-up closely with primary care. Appears to have had adequate dialysis treatment today and should be okay to continue with his usual outpatient dialysis schedule.     ____________________________________________   FINAL CLINICAL IMPRESSION(S) / ED DIAGNOSES  Final diagnoses:  Postural dizziness      Sharman CheekPhillip Traeton Bordas, MD 09/09/15 1307

## 2015-09-09 NOTE — ED Notes (Signed)
Pt back from getting de accessed down stairs

## 2015-09-09 NOTE — Discharge Instructions (Signed)
Mareos (Dizziness) Los mareos son un problema muy frecuente. Se trata de una sensacin de inestabilidad o de desvanecimiento. Puede sentir que se va a desmayar. Un mareo puede provocarle una lesin si se tropieza o se cae. Las personas de todas las edades pueden sufrir mareos, pero es ms frecuente en los adultos mayores. Esta afeccin puede tener muchas causas, entre las que se pueden mencionar los medicamentos, la deshidratacin y las enfermedades. INSTRUCCIONES PARA EL CUIDADO EN EL HOGAR Estas indicaciones pueden ayudarlo con el trastorno: Comida y bebida  Beba suficiente lquido para mantener la orina clara o de color amarillo plido. Esto evita la deshidratacin. Trate de beber ms lquidos transparentes, como agua.  No beba alcohol.  Limite el consumo de cafena si el mdico se lo indica.  Limite el consumo de sal si el mdico se lo indica. Actividad  Evite los movimientos rpidos.  Levntese de las sillas con lentitud y apyese hasta sentirse bien.  Por la maana, sintese primero a un lado de la cama. Cuando se sienta bien, pngase lentamente de pie mientras se sostiene de algo, hasta que sepa que ha logrado el equilibrio.  Mueva las piernas con frecuencia si debe estar de pie en un lugar durante mucho tiempo. Mientras est de pie, contraiga y relaje los msculos de las piernas.  No conduzca vehculos ni opere maquinaria pesada si se siente mareado.  Evite agacharse si se siente mareado. En su casa, coloque los objetos de modo que le resulte fcil alcanzarlos sin agacharse. Estilo de vida  No consuma ningn producto que contenga tabaco, lo que incluye cigarrillos, tabaco de mascar o cigarrillos electrnicos. Si necesita ayuda para dejar de fumar, consulte al mdico.  Trate de reducir el nivel de estrs practicando actividades como el yoga o la meditacin. Hable con el mdico si necesita ayuda. Instrucciones generales  Controle sus mareos para ver si hay cambios.  Tome los  medicamentos solamente como se lo haya indicado el mdico. Hable con el mdico si cree que los medicamentos que est tomando son la causa de sus mareos.  Infrmele a un amigo o a un familiar si se siente mareado. Pdale a esta persona que llame al mdico si observa cambios en su comportamiento.  Concurra a todas las visitas de control como se lo haya indicado el mdico. Esto es importante. SOLICITE ATENCIN MDICA SI:  Los mareos persisten.  Los mareos o la sensacin de desvanecimiento empeoran.  Siente nuseas.  Ha perdido la audicin.  Aparecen nuevos sntomas.  Cuando est de pie se siente inestable o que la habitacin da vueltas. SOLICITE ATENCIN MDICA DE INMEDIATO SI:  Vomita o tiene diarrea y no puede comer ni beber nada.  Tiene dificultad para hablar, para caminar, para tragar o para usar los brazos, las manos o las piernas.  Siente una debilidad generalizada.  No piensa con claridad o tiene dificultades para armar oraciones. Es posible que un amigo o un familiar adviertan que esto ocurre.  Tiene dolor de pecho, dolor abdominal, sudoracin o le falta el aire.  Hay cambios en la visin.  Observa un sangrado.  Tiene dolores de cabeza.  Tiene dolor o rigidez en el cuello.  Tiene fiebre.   Esta informacin no tiene como fin reemplazar el consejo del mdico. Asegrese de hacerle al mdico cualquier pregunta que tenga.   Document Released: 08/23/2005 Document Revised: 01/07/2015 Elsevier Interactive Patient Education 2016 [employer]  

## 2015-09-09 NOTE — ED Notes (Signed)
Pt here from dialysis; EMS reports pt had near syncopal episode while walking to bathroom. Pt denies LOC, denies hitting head. Pt alert and oriented upon arrival.

## 2015-09-09 NOTE — ED Notes (Signed)
Transporting patient to dialysis to get de accessed.

## 2015-09-09 NOTE — ED Notes (Signed)
Pt states that he no longer produces urine, due to renal failure. MD and RN notified.

## 2015-10-21 ENCOUNTER — Inpatient Hospital Stay
Admission: EM | Admit: 2015-10-21 | Discharge: 2015-10-23 | DRG: 193 | Disposition: A | Discharge status: Home / Self Care | Payer: Medicaid Other | Attending: Internal Medicine | Admitting: Internal Medicine

## 2015-10-21 ENCOUNTER — Inpatient Hospital Stay: Admit: 2015-10-21 | Payer: Medicaid Other

## 2015-10-21 ENCOUNTER — Inpatient Hospital Stay: Payer: Medicaid Other

## 2015-10-21 ENCOUNTER — Emergency Department: Payer: Medicaid Other

## 2015-10-21 DIAGNOSIS — I120 Hypertensive chronic kidney disease with stage 5 chronic kidney disease or end stage renal disease: Secondary | ICD-10-CM | POA: Diagnosis present

## 2015-10-21 DIAGNOSIS — I5032 Chronic diastolic (congestive) heart failure: Secondary | ICD-10-CM | POA: Diagnosis present

## 2015-10-21 DIAGNOSIS — Z992 Dependence on renal dialysis: Secondary | ICD-10-CM | POA: Diagnosis not present

## 2015-10-21 DIAGNOSIS — R739 Hyperglycemia, unspecified: Secondary | ICD-10-CM | POA: Diagnosis present

## 2015-10-21 DIAGNOSIS — N186 End stage renal disease: Secondary | ICD-10-CM | POA: Diagnosis present

## 2015-10-21 DIAGNOSIS — Z955 Presence of coronary angioplasty implant and graft: Secondary | ICD-10-CM | POA: Diagnosis not present

## 2015-10-21 DIAGNOSIS — J9601 Acute respiratory failure with hypoxia: Secondary | ICD-10-CM | POA: Diagnosis present

## 2015-10-21 DIAGNOSIS — J690 Pneumonitis due to inhalation of food and vomit: Secondary | ICD-10-CM | POA: Insufficient documentation

## 2015-10-21 DIAGNOSIS — N2581 Secondary hyperparathyroidism of renal origin: Secondary | ICD-10-CM | POA: Diagnosis present

## 2015-10-21 DIAGNOSIS — F17200 Nicotine dependence, unspecified, uncomplicated: Secondary | ICD-10-CM | POA: Diagnosis present

## 2015-10-21 DIAGNOSIS — J189 Pneumonia, unspecified organism: Principal | ICD-10-CM

## 2015-10-21 DIAGNOSIS — E1165 Type 2 diabetes mellitus with hyperglycemia: Secondary | ICD-10-CM | POA: Diagnosis present

## 2015-10-21 DIAGNOSIS — I2510 Atherosclerotic heart disease of native coronary artery without angina pectoris: Secondary | ICD-10-CM | POA: Diagnosis present

## 2015-10-21 DIAGNOSIS — D631 Anemia in chronic kidney disease: Secondary | ICD-10-CM | POA: Diagnosis present

## 2015-10-21 DIAGNOSIS — R55 Syncope and collapse: Secondary | ICD-10-CM | POA: Diagnosis present

## 2015-10-21 DIAGNOSIS — Z794 Long term (current) use of insulin: Secondary | ICD-10-CM | POA: Diagnosis not present

## 2015-10-21 DIAGNOSIS — N400 Enlarged prostate without lower urinary tract symptoms: Secondary | ICD-10-CM | POA: Diagnosis present

## 2015-10-21 DIAGNOSIS — E039 Hypothyroidism, unspecified: Secondary | ICD-10-CM | POA: Diagnosis present

## 2015-10-21 DIAGNOSIS — R0902 Hypoxemia: Secondary | ICD-10-CM | POA: Diagnosis present

## 2015-10-21 DIAGNOSIS — E1122 Type 2 diabetes mellitus with diabetic chronic kidney disease: Secondary | ICD-10-CM | POA: Diagnosis present

## 2015-10-21 DIAGNOSIS — K920 Hematemesis: Secondary | ICD-10-CM | POA: Diagnosis present

## 2015-10-21 DIAGNOSIS — F329 Major depressive disorder, single episode, unspecified: Secondary | ICD-10-CM | POA: Diagnosis present

## 2015-10-21 DIAGNOSIS — Z8249 Family history of ischemic heart disease and other diseases of the circulatory system: Secondary | ICD-10-CM | POA: Diagnosis not present

## 2015-10-21 HISTORY — DX: Chronic diastolic (congestive) heart failure: I50.32

## 2015-10-21 HISTORY — DX: Atherosclerosis of coronary artery bypass graft(s) without angina pectoris: I25.810

## 2015-10-21 LAB — CBC
HCT: 30 % — ABNORMAL LOW (ref 40.0–52.0)
HEMATOCRIT: 26.5 % — AB (ref 40.0–52.0)
Hemoglobin: 10.5 g/dL — ABNORMAL LOW (ref 13.0–18.0)
Hemoglobin: 9.2 g/dL — ABNORMAL LOW (ref 13.0–18.0)
MCH: 32.8 pg (ref 26.0–34.0)
MCH: 32 pg (ref 26.0–34.0)
MCHC: 35.2 g/dL (ref 32.0–36.0)
MCHC: 34.8 g/dL (ref 32.0–36.0)
MCV: 93.4 fL (ref 80.0–100.0)
MCV: 92 fL (ref 80.0–100.0)
PLATELETS: 176 10*3/uL (ref 150–440)
PLATELETS: 172 10*3/uL (ref 150–440)
RBC: 3.21 MIL/uL — AB (ref 4.40–5.90)
RBC: 2.88 MIL/uL — AB (ref 4.40–5.90)
RDW: 14.6 % — AB (ref 11.5–14.5)
RDW: 14.9 % — ABNORMAL HIGH (ref 11.5–14.5)
WBC: 16 10*3/uL — AB (ref 3.8–10.6)
WBC: 14.9 10*3/uL — AB (ref 3.8–10.6)

## 2015-10-21 LAB — RENAL FUNCTION PANEL
Albumin: 3.6 g/dL (ref 3.5–5.0)
Anion gap: 15 (ref 5–15)
BUN: 66 mg/dL — ABNORMAL HIGH (ref 6–20)
CHLORIDE: 88 mmol/L — AB (ref 101–111)
CO2: 23 mmol/L (ref 22–32)
Calcium: 8.8 mg/dL — ABNORMAL LOW (ref 8.9–10.3)
Creatinine, Ser: 10.19 mg/dL — ABNORMAL HIGH (ref 0.61–1.24)
GFR, EST AFRICAN AMERICAN: 6 mL/min — AB (ref >60)
GFR, EST NON AFRICAN AMERICAN: 5 mL/min — AB (ref >60)
Glucose, Bld: 270 mg/dL — ABNORMAL HIGH (ref 65–99)
PHOSPHORUS: 4.5 mg/dL (ref 2.5–4.6)
POTASSIUM: 5.4 mmol/L — AB (ref 3.5–5.1)
Sodium: 126 mmol/L — ABNORMAL LOW (ref 135–145)

## 2015-10-21 LAB — COMPREHENSIVE METABOLIC PANEL
ALBUMIN: 4.2 g/dL (ref 3.5–5.0)
ALT: 13 U/L — ABNORMAL LOW (ref 17–63)
AST: 16 U/L (ref 15–41)
Alkaline Phosphatase: 111 U/L (ref 38–126)
Anion gap: 16 — ABNORMAL HIGH (ref 5–15)
BUN: 59 mg/dL — AB (ref 6–20)
CHLORIDE: 79 mmol/L — AB (ref 101–111)
CO2: 21 mmol/L — AB (ref 22–32)
Calcium: 8.3 mg/dL — ABNORMAL LOW (ref 8.9–10.3)
Creatinine, Ser: 9.79 mg/dL — ABNORMAL HIGH (ref 0.61–1.24)
GFR calc Af Amer: 6 mL/min — ABNORMAL LOW (ref >60)
GFR, EST NON AFRICAN AMERICAN: 5 mL/min — AB (ref >60)
GLUCOSE: 626 mg/dL — AB (ref 65–99)
POTASSIUM: 5.8 mmol/L — AB (ref 3.5–5.1)
SODIUM: 116 mmol/L — AB (ref 135–145)
Total Bilirubin: 1.1 mg/dL (ref 0.3–1.2)
Total Protein: 8 g/dL (ref 6.5–8.1)

## 2015-10-21 LAB — GLUCOSE, CAPILLARY
GLUCOSE-CAPILLARY: 462 mg/dL — AB (ref 65–99)
GLUCOSE-CAPILLARY: 485 mg/dL — AB (ref 65–99)
GLUCOSE-CAPILLARY: 120 mg/dL — AB (ref 65–99)
GLUCOSE-CAPILLARY: 117 mg/dL — AB (ref 65–99)
Glucose-Capillary: >600 mg/dL (ref 65–99)
Glucose-Capillary: 442 mg/dL — ABNORMAL HIGH (ref 65–99)
Glucose-Capillary: 374 mg/dL — ABNORMAL HIGH (ref 65–99)

## 2015-10-21 LAB — LACTIC ACID, PLASMA
LACTIC ACID, VENOUS: 1.1 mmol/L (ref 0.5–2.0)
LACTIC ACID, VENOUS: 1.4 mmol/L (ref 0.5–2.0)

## 2015-10-21 LAB — HEMOGLOBIN A1C: HEMOGLOBIN A1C: 9.8 % — AB (ref 4.0–6.0)

## 2015-10-21 LAB — TROPONIN I

## 2015-10-21 LAB — BRAIN NATRIURETIC PEPTIDE: B Natriuretic Peptide: 2770 pg/mL — ABNORMAL HIGH (ref 0.0–100.0)

## 2015-10-21 MED ORDER — ATORVASTATIN CALCIUM 20 MG PO TABS
80.0000 mg | ORAL_TABLET | Freq: Every day | ORAL | Status: DC
  Administered 2015-10-21 – 2015-10-22 (×2): 80 mg via ORAL
  Filled 2015-10-21 (×2): qty 4

## 2015-10-21 MED ORDER — HEPARIN SODIUM (PORCINE) 5000 UNIT/ML IJ SOLN
5000.0000 [IU] | Freq: Three times a day (TID) | INTRAMUSCULAR | Status: DC
  Administered 2015-10-21 – 2015-10-23 (×5): 5000 [IU] via SUBCUTANEOUS
  Filled 2015-10-21 (×5): qty 1

## 2015-10-21 MED ORDER — INSULIN ASPART 100 UNIT/ML ~~LOC~~ SOLN: 10.0000 [IU] | Freq: Once | SUBCUTANEOUS | Status: DC

## 2015-10-21 MED ORDER — SENNOSIDES-DOCUSATE SODIUM 8.6-50 MG PO TABS: 1.0000 | ORAL_TABLET | Freq: Every evening | ORAL | Status: DC | PRN

## 2015-10-21 MED ORDER — LEVOFLOXACIN 500 MG PO TABS: 500.0000 mg | ORAL_TABLET | ORAL | Status: DC

## 2015-10-21 MED ORDER — FLUOXETINE HCL 20 MG PO CAPS
40.0000 mg | ORAL_CAPSULE | Freq: Every day | ORAL | Status: DC
  Administered 2015-10-21 – 2015-10-22 (×2): 40 mg via ORAL
  Filled 2015-10-21 (×3): qty 2

## 2015-10-21 MED ORDER — ACETAMINOPHEN 325 MG PO TABS: 650.0000 mg | ORAL_TABLET | Freq: Four times a day (QID) | ORAL | Status: DC | PRN

## 2015-10-21 MED ORDER — LIVING WELL WITH DIABETES BOOK - IN SPANISH
Freq: Once | Status: AC
  Administered 2015-10-22: 13:00:00
  Filled 2015-10-21 (×2): qty 1

## 2015-10-21 MED ORDER — LORAZEPAM 0.5 MG PO TABS
0.5000 mg | ORAL_TABLET | Freq: Every day | ORAL | Status: DC
  Administered 2015-10-21 – 2015-10-22 (×2): 0.5 mg via ORAL
  Filled 2015-10-21 (×2): qty 1

## 2015-10-21 MED ORDER — INSULIN ASPART 100 UNIT/ML ~~LOC~~ SOLN
10.0000 [IU] | Freq: Once | SUBCUTANEOUS | Status: AC
  Administered 2015-10-21: 10 [IU] via INTRAVENOUS
  Filled 2015-10-21: qty 10

## 2015-10-21 MED ORDER — LINAGLIPTIN 5 MG PO TABS
5.0000 mg | ORAL_TABLET | Freq: Every day | ORAL | Status: DC
  Administered 2015-10-21 – 2015-10-22 (×2): 5 mg via ORAL
  Filled 2015-10-21 (×2): qty 1

## 2015-10-21 MED ORDER — ALUM & MAG HYDROXIDE-SIMETH 200-200-20 MG/5ML PO SUSP: 30.0000 mL | Freq: Four times a day (QID) | ORAL | Status: DC | PRN

## 2015-10-21 MED ORDER — INSULIN ASPART 100 UNIT/ML ~~LOC~~ SOLN
0.0000 [IU] | Freq: Three times a day (TID) | SUBCUTANEOUS | Status: DC
  Administered 2015-10-22: 8 [IU] via SUBCUTANEOUS
  Administered 2015-10-22: 5 [IU] via SUBCUTANEOUS
  Administered 2015-10-22: 8 [IU] via SUBCUTANEOUS
  Administered 2015-10-23: 3 [IU] via SUBCUTANEOUS
  Filled 2015-10-21: qty 3
  Filled 2015-10-21: qty 5
  Filled 2015-10-21 (×2): qty 8

## 2015-10-21 MED ORDER — HYDROCODONE-ACETAMINOPHEN 5-325 MG PO TABS
1.0000 | ORAL_TABLET | ORAL | Status: DC | PRN
  Administered 2015-10-23: 1 via ORAL
  Filled 2015-10-21: qty 1

## 2015-10-21 MED ORDER — HYDRALAZINE HCL 20 MG/ML IJ SOLN: 10.0000 mg | Freq: Four times a day (QID) | INTRAMUSCULAR | Status: DC | PRN

## 2015-10-21 MED ORDER — DOXAZOSIN MESYLATE 1 MG PO TABS
1.0000 mg | ORAL_TABLET | Freq: Every day | ORAL | Status: DC
  Administered 2015-10-21 – 2015-10-22 (×2): 1 mg via ORAL
  Filled 2015-10-21 (×3): qty 1

## 2015-10-21 MED ORDER — ONDANSETRON HCL 4 MG/2ML IJ SOLN: 4.0000 mg | Freq: Four times a day (QID) | INTRAMUSCULAR | Status: DC | PRN

## 2015-10-21 MED ORDER — SODIUM CHLORIDE 0.9% FLUSH
3.0000 mL | Freq: Two times a day (BID) | INTRAVENOUS | Status: DC
  Administered 2015-10-22 – 2015-10-23 (×3): 3 mL via INTRAVENOUS

## 2015-10-21 MED ORDER — LISINOPRIL 20 MG PO TABS
40.0000 mg | ORAL_TABLET | Freq: Every day | ORAL | Status: DC
  Administered 2015-10-21 – 2015-10-22 (×2): 40 mg via ORAL
  Filled 2015-10-21 (×2): qty 2

## 2015-10-21 MED ORDER — LEVOFLOXACIN IN D5W 750 MG/150ML IV SOLN
750.0000 mg | Freq: Once | INTRAVENOUS | Status: AC
  Administered 2015-10-21: 750 mg via INTRAVENOUS
  Filled 2015-10-21: qty 150

## 2015-10-21 MED ORDER — INSULIN ASPART 100 UNIT/ML ~~LOC~~ SOLN: 0.0000 [IU] | Freq: Every day | SUBCUTANEOUS | Status: DC

## 2015-10-21 MED ORDER — CARVEDILOL 25 MG PO TABS
25.0000 mg | ORAL_TABLET | Freq: Two times a day (BID) | ORAL | Status: DC
  Administered 2015-10-21 – 2015-10-23 (×4): 25 mg via ORAL
  Filled 2015-10-21 (×4): qty 1

## 2015-10-21 MED ORDER — ISOSORBIDE MONONITRATE ER 60 MG PO TB24
120.0000 mg | ORAL_TABLET | Freq: Every day | ORAL | Status: DC
  Administered 2015-10-21 – 2015-10-22 (×2): 120 mg via ORAL
  Filled 2015-10-21 (×2): qty 2

## 2015-10-21 MED ORDER — ACETAMINOPHEN 650 MG RE SUPP: 650.0000 mg | Freq: Four times a day (QID) | RECTAL | Status: DC | PRN

## 2015-10-21 MED ORDER — CLOPIDOGREL BISULFATE 75 MG PO TABS
75.0000 mg | ORAL_TABLET | Freq: Every day | ORAL | Status: DC
  Administered 2015-10-21 – 2015-10-22 (×2): 75 mg via ORAL
  Filled 2015-10-21 (×2): qty 1

## 2015-10-21 MED ORDER — ONDANSETRON HCL 4 MG PO TABS: 4.0000 mg | ORAL_TABLET | Freq: Four times a day (QID) | ORAL | Status: DC | PRN

## 2015-10-21 MED ORDER — PNEUMOCOCCAL VAC POLYVALENT 25 MCG/0.5ML IJ INJ
0.5000 mL | INJECTION | INTRAMUSCULAR | Status: AC
  Administered 2015-10-22: 0.5 mL via INTRAMUSCULAR
  Filled 2015-10-21: qty 0.5

## 2015-10-21 MED ORDER — FINASTERIDE 5 MG PO TABS
5.0000 mg | ORAL_TABLET | Freq: Every day | ORAL | Status: DC
  Administered 2015-10-21 – 2015-10-22 (×2): 5 mg via ORAL
  Filled 2015-10-21 (×2): qty 1

## 2015-10-21 MED ORDER — LEVOTHYROXINE SODIUM 100 MCG PO TABS
100.0000 ug | ORAL_TABLET | Freq: Every day | ORAL | Status: DC
  Administered 2015-10-22: 100 ug via ORAL
  Filled 2015-10-21: qty 1

## 2015-10-21 MED ORDER — SODIUM CHLORIDE 0.9 % IV SOLN
INTRAVENOUS | Status: DC
  Filled 2015-10-21: qty 2.5

## 2015-10-21 MED ORDER — PANTOPRAZOLE SODIUM 40 MG PO TBEC
40.0000 mg | DELAYED_RELEASE_TABLET | Freq: Every day | ORAL | Status: DC
  Administered 2015-10-21 – 2015-10-22 (×2): 40 mg via ORAL
  Filled 2015-10-21 (×2): qty 1

## 2015-10-21 MED ORDER — ASPIRIN EC 81 MG PO TBEC
81.0000 mg | DELAYED_RELEASE_TABLET | Freq: Every day | ORAL | Status: DC
  Administered 2015-10-21 – 2015-10-22 (×2): 81 mg via ORAL
  Filled 2015-10-21 (×2): qty 1

## 2015-10-21 MED ORDER — INSULIN ASPART 100 UNIT/ML ~~LOC~~ SOLN
15.0000 [IU] | SUBCUTANEOUS | Status: AC
  Administered 2015-10-21: 15 [IU] via SUBCUTANEOUS
  Filled 2015-10-21: qty 15

## 2015-10-21 MED ORDER — CALCIUM ACETATE (PHOS BINDER) 667 MG PO CAPS
667.0000 mg | ORAL_CAPSULE | Freq: Every day | ORAL | Status: DC
  Administered 2015-10-21 – 2015-10-22 (×2): 667 mg via ORAL
  Filled 2015-10-21 (×2): qty 1

## 2015-10-21 MED ORDER — AMLODIPINE BESYLATE 10 MG PO TABS
10.0000 mg | ORAL_TABLET | Freq: Every day | ORAL | Status: DC
  Administered 2015-10-21 – 2015-10-23 (×3): 10 mg via ORAL
  Filled 2015-10-21 (×3): qty 1

## 2015-10-21 NOTE — ED Notes (Signed)
md notified of blood sugar recheck. Will hold on insulin gtt until another blood sugar recheck per dr Alphonzo Lemmings.

## 2015-10-21 NOTE — ED Notes (Addendum)
Per EMS pt fell hitting his head. Pt reports weakness in his legs. Pt reports hitting his head. Denies LOC. Pt also has elevated BS

## 2015-10-21 NOTE — Progress Notes (Signed)
Inpatient Diabetes Program Recommendations  AACE/ADA: New Consensus Statement on Inpatient Glycemic Control (2015)  Target Ranges:  Prepandial:   less than 140 mg/dL      Peak postprandial:   less than 180 mg/dL (1-2 hours)      Critically ill patients:  140 - 180 mg/dL  Results for [name], [name] (MRN [id number]) as of 10/21/2015 14:40  Ref. Range 10/21/2015 06:50 10/21/2015 08:49 10/21/2015 09:20 10/21/2015 10:22 10/21/2015 11:26  Glucose-Capillary Latest Ref Range: 65-99 mg/dL >132 (HH) 440 (H) Novolog 10 units :13 462 (H) 442 (H) Novolog 15 units @ 10:49 374 (H)   Review of Glycemic Control  Outpatient Diabetes medications: 70/30 20 units BID with meals, Tradjenta 5 mg QHS Current orders for Inpatient glycemic control: Novolog 0-15 units TID with meals, Novolog 0-5 units QHS, Tradjenta 5 mg QHS  Inpatient Diabetes Program Recommendations: Insulin - Basal: Please consider ordering 70/30 10 units BID with meals starting with supper today (which will provide 14 units for basal and 6 units for meal coverage per day). A1C: A1C in process.  NOTE: Diabetes Coordinator consult received. Reviewed chart. Patient is currently receiving dialysis which will not be complete until around 4:00 pm today. Diabetes Coordinator will follow up with patient on 10/22/15 and arrange for a translator to discuss diabetes and glycemic control.   Thanks, Orlando Penner, RN, MSN, CDE Diabetes Coordinator Inpatient Diabetes Program [phone] (Team Pager from 8am to 5pm) [phone] ([facility] office) [phone] [facility] office) [phone] [facility] office)

## 2015-10-21 NOTE — Care Management (Signed)
Notified by RN that patient is a dialysis patient M, W, F. He is listed as self-pay. O2 new. RNCM will follow. I have notified Ivor Reining dialysis liaison of patient admission.

## 2015-10-21 NOTE — Progress Notes (Signed)
Hemodialysis completed. 

## 2015-10-21 NOTE — Care Management Note (Signed)
Patient is active at [facility] [address] on TTS schedule.  Patients clinic has been notified ofnadmiision.  Additional medical records will be sent at discharge.  Ivor Reining  Dialysis Coordinator  [phone]

## 2015-10-21 NOTE — Progress Notes (Signed)
Hemodialysis start 

## 2015-10-21 NOTE — Progress Notes (Signed)
ANTIBIOTIC CONSULT NOTE - INITIAL  Pharmacy Consult for Levofloxacin  Indication: pneumonia  No Known Allergies  Patient Measurements: Height:  (157.5 cm) Weight: 154 lb 6.4 oz (70.035 kg) IBW/kg (Calculated) : 54.6 Adjusted Body Weight:   Vital Signs: Temp: 98.2 F (36.8 C) (02/14 1006) Temp Source: Oral (02/14 1006) BP: 172/68 mmHg (02/14 1010) Pulse Rate: 78 (02/14 1010) Intake/Output from previous day:   Intake/Output from this shift:    Labs:  Recent Labs  10/21/15 0654  WBC 16.0*  HGB 10.5*  PLT 176  CREATININE 9.79*   Estimated Creatinine Clearance: 7.7 mL/min (by C-G formula based on Cr of 9.79). No results for input(s): VANCOTROUGH, VANCOPEAK, VANCORANDOM, GENTTROUGH, GENTPEAK, GENTRANDOM, TOBRATROUGH, TOBRAPEAK, TOBRARND, AMIKACINPEAK, AMIKACINTROU, AMIKACIN in the last 72 hours.   Microbiology: No results found for this or any previous visit (from the past 720 hour(s)).  Medical History: Past Medical History  Diagnosis Date  . Dialysis patient (HCC)   . Renal disorder   . Hypertension   . Diabetes mellitus without complication (HCC)   . Thyroid disease   . CHF (congestive heart failure) (HCC)   . Depression   . BPH (benign prostatic hyperplasia)   . High cholesterol     Medications:  Prescriptions prior to admission  Medication Sig Dispense Refill Last Dose  . amLODipine (NORVASC) 10 MG tablet Take 10 mg by mouth daily.   10/20/2015 at Unknown time  . aspirin EC 81 MG tablet Take 81 mg by mouth daily.   10/20/2015 at Unknown time  . atorvastatin (LIPITOR) 80 MG tablet Take 80 mg by mouth at bedtime.   10/20/2015 at Unknown time  . calcium acetate (PHOSLO) 667 MG capsule Take 167 mg by mouth daily.   10/20/2015 at Unknown time  . carvedilol (COREG) 25 MG tablet Take 25 mg by mouth 2 (two) times daily with a meal.   10/20/2015 at Unknown time  . clopidogrel (PLAVIX) 75 MG tablet Take 75 mg by mouth at bedtime.   10/20/2015 at Unknown time  .  doxazosin (CARDURA) 1 MG tablet Take 1 mg by mouth at bedtime.   10/20/2015 at Unknown time  . finasteride (PROSCAR) 5 MG tablet Take 5 mg by mouth at bedtime.   10/20/2015 at Unknown time  . FLUoxetine (PROZAC) 40 MG capsule Take 40 mg by mouth daily.   10/20/2015 at Unknown time  . insulin NPH-regular Human (NOVOLIN 70/30) (70-30) 100 UNIT/ML injection Inject 20 Units into the skin 2 (two) times daily with a meal.   10/20/2015 at Unknown time  . isosorbide mononitrate (IMDUR) 120 MG 24 hr tablet Take 120 mg by mouth daily.   10/20/2015 at Unknown time  . levothyroxine (SYNTHROID, LEVOTHROID) 100 MCG tablet Take 100 mcg by mouth daily before breakfast.   10/20/2015 at Unknown time  . linagliptin (TRADJENTA) 5 MG TABS tablet Take 5 mg by mouth at bedtime.   10/20/2015 at Unknown time  . lisinopril (PRINIVIL,ZESTRIL) 40 MG tablet Take 40 mg by mouth at bedtime.   10/20/2015 at Unknown time  . LORazepam (ATIVAN) 0.5 MG tablet Take 0.5 mg by mouth at bedtime.   10/20/2015 at Unknown time  . nitroGLYCERIN (NITROSTAT) 0.4 MG SL tablet Take 0.4 mg by mouth every 5 (five) minutes as needed.   prn  . omeprazole (PRILOSEC) 40 MG capsule Take 40 mg by mouth daily.   10/20/2015 at Unknown time   Scheduled:  . amLODipine  10 mg Oral Daily  . aspirin  EC  81 mg Oral Daily  . atorvastatin  80 mg Oral QHS  . calcium acetate  667 mg Oral Q breakfast  . carvedilol  25 mg Oral BID WC  . clopidogrel  75 mg Oral QHS  . doxazosin  1 mg Oral QHS  . finasteride  5 mg Oral QHS  . FLUoxetine  40 mg Oral Daily  . heparin  5,000 Units Subcutaneous 3 times per day  . insulin aspart  0-15 Units Subcutaneous TID WC  . insulin aspart  0-5 Units Subcutaneous QHS  . insulin aspart  15 Units Subcutaneous STAT  . isosorbide mononitrate  120 mg Oral Daily  . [START ON 10/23/2015] levofloxacin  500 mg Oral Q48H  . levothyroxine  100 mcg Oral QAC breakfast  . linagliptin  5 mg Oral QHS  . lisinopril  40 mg Oral QHS  . LORazepam  0.5  mg Oral QHS  . pantoprazole  40 mg Oral Daily  . [START ON 10/22/2015] pneumococcal 23 valent vaccine  0.5 mL Intramuscular Tomorrow-1000  . sodium chloride flush  3 mL Intravenous Q12H   Assessment: Pharmacy consulted to dose and monitor Levofloxacin therapy in this 50 year old male being treated for possible CAP. Patient is HD Mon, Wed, Fri  Goal of Therapy:  Resolution of possible infection  Plan:  Patient received Levofloxacin 750 mg IV x 1 and will start levofloxacin 500 mg PO q48 hours  Chery Giusto D 10/21/2015,10:44 AM

## 2015-10-21 NOTE — H&P (Addendum)
[facility] - [place] at [facility]   PATIENT NAME: Marcus [name]    MR#:  [id number]  DATE OF BIRTH:  [birth year]  DATE OF ADMISSION:  10/21/2015  PRIMARY CARE PHYSICIAN: [facility] [place]   REQUESTING/REFERRING PHYSICIAN: dr Alphonzo Lemmings  CHIEF COMPLAINT:  Shortness of breath HISTORY OF PRESENT ILLNESS:  Marcus [name]  is a 50 y.o. male with a known history of end-stage renal disease on hemodialysis Tuesday, Thursday and Friday and depression who presents with above complaint. Patient reports since yesterday he's had shortness of breath and PND. He also has had some chills but no fever. In the emergency room chest x-ray shows pneumonia and he started on Levaquin. He is due for dialysis today. He did not go to dialysis due to shortness of breath.  PAST MEDICAL HISTORY:   Past Medical History  Diagnosis Date  . Dialysis patient (HCC)   . Renal disorder   . Hypertension   . Diabetes mellitus without complication (HCC)   . Thyroid disease   . CHF (congestive heart failure) (HCC)   . Depression   . BPH (benign prostatic hyperplasia)   . High cholesterol     PAST SURGICAL HISTORY:  He has a cardiac stent and 84 for dialysis  SOCIAL HISTORY:  No EtOH abuse Social History  Substance Use Topics  . Smoking status: Current Every Day Smoker  . Smokeless tobacco:  no   .      FAMILY HISTORY:  Positive for hypertension  DRUG ALLERGIES:  No Known Allergies   REVIEW OF SYSTEMS:  CONSTITUTIONAL: No fever,  positive fatigue no weakness.  EYES: No blurred or double vision.  EARS, NOSE, AND THROAT: No tinnitus or ear pain.  RESPIRATORY: positive for cough, shortness of breath, no  wheezing or hemoptysis.  CARDIOVASCULAR: No chest pain, orthopnea, edema.  GASTROINTESTINAL: No nausea, vomiting, diarrhea or abdominal pain.  GENITOURINARY: No dysuria, hematuria.  ENDOCRINE: No polyuria, nocturia,  HEMATOLOGY: No anemia, easy bruising or  bleeding SKIN: No rash or lesion. MUSCULOSKELETAL: No joint pain or arthritis.   NEUROLOGIC: No tingling, numbness, weakness.  PSYCHIATRY: ++ anxiety /depression.   MEDICATIONS AT HOME:   Prior to Admission medications   Medication Sig Start Date End Date Taking? Authorizing Provider  amLODipine (NORVASC) 10 MG tablet Take 10 mg by mouth daily.   Yes Historical Provider, MD  aspirin EC 81 MG tablet Take 81 mg by mouth daily.   Yes Historical Provider, MD  atorvastatin (LIPITOR) 80 MG tablet Take 80 mg by mouth at bedtime.   Yes Historical Provider, MD  calcium acetate (PHOSLO) 667 MG capsule Take 167 mg by mouth daily.   Yes Historical Provider, MD  carvedilol (COREG) 25 MG tablet Take 25 mg by mouth 2 (two) times daily with a meal.   Yes Historical Provider, MD  clopidogrel (PLAVIX) 75 MG tablet Take 75 mg by mouth at bedtime.   Yes Historical Provider, MD  doxazosin (CARDURA) 1 MG tablet Take 1 mg by mouth at bedtime.   Yes Historical Provider, MD  finasteride (PROSCAR) 5 MG tablet Take 5 mg by mouth at bedtime.   Yes Historical Provider, MD  FLUoxetine (PROZAC) 40 MG capsule Take 40 mg by mouth daily.   Yes Historical Provider, MD  insulin NPH-regular Human (NOVOLIN 70/30) (70-30) 100 UNIT/ML injection Inject 20 Units into the skin 2 (two) times daily with a meal.   Yes Historical Provider, MD  isosorbide mononitrate (IMDUR) 120 MG 24  hr tablet Take 120 mg by mouth daily.   Yes Historical Provider, MD  levothyroxine (SYNTHROID, LEVOTHROID) 100 MCG tablet Take 100 mcg by mouth daily before breakfast.   Yes Historical Provider, MD  linagliptin (TRADJENTA) 5 MG TABS tablet Take 5 mg by mouth at bedtime.   Yes Historical Provider, MD  lisinopril (PRINIVIL,ZESTRIL) 40 MG tablet Take 40 mg by mouth at bedtime.   Yes Historical Provider, MD  LORazepam (ATIVAN) 0.5 MG tablet Take 0.5 mg by mouth at bedtime.   Yes Historical Provider, MD  nitroGLYCERIN (NITROSTAT) 0.4 MG SL tablet Take 0.4 mg by  mouth every 5 (five) minutes as needed. 10/24/12  Yes Historical Provider, MD  omeprazole (PRILOSEC) 40 MG capsule Take 40 mg by mouth daily.   Yes Historical Provider, MD      VITAL SIGNS:  Blood pressure 172/68, pulse 78, temperature 98.2 F (36.8 C), temperature source Oral, resp. rate 21, height  (1.575 m), weight 70.035 kg (154 lb 6.4 oz), SpO2 98 %.  PHYSICAL EXAMINATION:  GENERAL:  50 y.o.-year-old patient lying in the bed with no acute distress.  EYES: Pupils equal, round, reactive to light and accommodation. No scleral icterus. Extraocular muscles intact.  HEENT: Head atraumatic, normocephalic. Oropharynx and nasopharynx clear.  NECK:  Supple, no jugular venous distention. No thyroid enlargement, no tenderness.  LUNGS: crackles bilaterally without wheezing or rhonchi or rales. No use of S S3 muscles.   CARDIOVASCULAR: S1, S2 normal. No murmurs, rubs, or gallops.  ABDOMEN: Soft, nontender, nondistended. Bowel sounds present. No organomegaly or mass.  EXTREMITIES: No pedal edema, cyanosis, or clubbing.  NEUROLOGIC: Cranial nerves II through XII are grossly intact. No focal deficits. PSYCHIATRIC: The patient is alert and oriented x 3.  SKIN: No obvious rash, lesion, or ulcer.   LABORATORY PANEL:   CBC  Recent Labs Lab 10/21/15 0654  WBC 16.0*  HGB 10.5*  HCT 30.0*  PLT 176   ------------------------------------------------------------------------------------------------------------------  Chemistries   Recent Labs Lab 10/21/15 0654  NA 116*  K 5.8*  CL 79*  CO2 21*  GLUCOSE 626*  BUN 59*  CREATININE 9.79*  CALCIUM 8.3*  AST 16  ALT 13*  ALKPHOS 111  BILITOT 1.1   ------------------------------------------------------------------------------------------------------------------  Cardiac Enzymes  Recent Labs Lab 10/21/15 0654  TROPONINI <0.03    ------------------------------------------------------------------------------------------------------------------  RADIOLOGY:  Dg Chest 2 View  10/21/2015  CLINICAL DATA:  History CHF with generalized weakness EXAM: CHEST  2 VIEW COMPARISON:  09/09/2015 FINDINGS: Cardiac shadow is stable. The lungs are well aerated bilaterally and demonstrate increased patchy infiltrate in the bases. Small bilateral pleural effusions are seen. No other focal abnormality is noted. IMPRESSION: New bibasilar patchy infiltrates. Electronically Signed   By: Alcide Clever M.D.   On: 10/21/2015 08:10    EKG:    Sinus rhythm no ST elevation or depression  IMPRESSION AND PLAN:   49 year old male with history of end-stage renal disease who presents with acute hypoxic respiratory failure.  1. Acute hypoxic respiratory failure: This is secondary to community-acquired pneumonia and end-stage renal disease and hematemesis.   wean oxygen as tolerated  Plan as outlined below. Order echocardiogram. Patient's cardiologist at [facility].   2. Community-acquired pneumonia: Continue Levaquin and follow up on blood cultures Exline 3. ESRD on hematemesis: Nephrology will be consulted for dialysis. Patient receives dialysis Tuesday, Thursday and Saturday. Patient's nephrologist is at [facility].    4. Uncontrolled diabetes: This is secondary to community-acquired pneumonia. Patient's blood sugar has improved with IV  insulin. He does not need insulin drip at this time. Continue sliding scale insulin and ADA diet. Continue his outpatient medications and check hemoglobin A1c. Diabetes educator will be consulted.  5. History of CAD with stent: Continue Coreg, Plavix, atorvastatin, Cipro aspirin.  6. Depression: Continue Prozac  7. Hypothyroid: Continue Synthroid  8. Accelerated hypertension: Patient did not receive dialysis or his medications this morning. Restart his outpatient medications and will use hydralazine when  necessary.   All the records are reviewed and case discussed with ED provider. Management plans discussed with the patient and he is in agreement.  CODE STATUS: FULL  TOTAL TIME TAKING CARE OF THIS PATIENT: 50 minutes   Adis Sturgill M.D on 10/21/2015 at 10:59 AM  Between 7am to 6pm - Pager - [phone] After 6pm go to [email or web address] - password [email or web address] [facility]  [facility]  Office  [phone]  CC: Primary care physician; [facility] [place]

## 2015-10-21 NOTE — Progress Notes (Signed)
Pre-hd tx 

## 2015-10-21 NOTE — Progress Notes (Signed)
Patients BS 374, sliding scale calls for 15 unit of insulin. Patient just received 15 units once at 1049. Per Diabetes Coordinator and Dr Juliene Pina, hold sliding scale insulin for lunch time dose.

## 2015-10-21 NOTE — Progress Notes (Signed)
[facility]  ROUNDING NOTE   Subjective:   Patient went to dialysis this morning where he had a syncopal episode. He states he has been having these frequently. He has shortness of breath and chest pain.  Admitted to [facility] and diagnosed with pneumonia.  Placed on dialysis as today is his regularly scheduled day. Patient is tolerating treatment well.   Objective:  Vital signs in last 24 hours:  Temp:  [97.7 F (36.5 C)-98.2 F (36.8 C)] 98 F (36.7 C) (02/14 1300) Pulse Rate:  [76-85] 78 (02/14 1550) Resp:  [15-26] 15 (02/14 1550) BP: (138-194)/(65-88) 184/76 mmHg (02/14 1550) SpO2:  [85 %-100 %] 100 % (02/14 1550) Weight:  [70.035 kg (154 lb 6.4 oz)] 70.035 kg (154 lb 6.4 oz) (02/14 1300)  Weight change:  Filed Weights   10/21/15 1006 10/21/15 1300  Weight: 70.035 kg (154 lb 6.4 oz) 70.035 kg (154 lb 6.4 oz)    Intake/Output:     Intake/Output this shift:     Physical Exam: General: NAD, laying in bed  Head: Normocephalic, atraumatic. Moist oral mucosal membranes  Eyes: Anicteric, PERRL  Neck: Supple, trachea midline  Lungs:  Clear to auscultation  Heart: Regular rate and rhythm  Abdomen:  Soft, nontender,   Extremities: no peripheral edema.  Neurologic: Nonfocal, moving all four extremities  Skin: No lesions  Access: Left forearm AVF    Basic Metabolic Panel:  Recent Labs Lab 10/21/15 0654  NA 116*  K 5.8*  CL 79*  CO2 21*  GLUCOSE 626*  BUN 59*  CREATININE 9.79*  CALCIUM 8.3*    Liver Function Tests:  Recent Labs Lab 10/21/15 0654  AST 16  ALT 13*  ALKPHOS 111  BILITOT 1.1  PROT 8.0  ALBUMIN 4.2   No results for input(s): LIPASE, AMYLASE in the last 168 hours. No results for input(s): AMMONIA in the last 168 hours.  CBC:  Recent Labs Lab 10/21/15 0654 10/21/15 1308  WBC 16.0* 14.9*  HGB 10.5* 9.2*  HCT 30.0* 26.5*  MCV 93.4 92.0  PLT 176 172    Cardiac Enzymes:  Recent Labs Lab 10/21/15 0654  TROPONINI  <0.03    BNP: Invalid input(s): POCBNP  CBG:  Recent Labs Lab 10/21/15 0650 10/21/15 0849 10/21/15 0920 10/21/15 1022 10/21/15 1126  GLUCAP >600* 485* 462* 442* 374*    Microbiology: Results for orders placed or performed in visit on 09/28/12  Culture, blood (single)     Status: None   Collection Time: 09/28/12  8:12 AM  Result Value Ref Range Status   Micro Text Report   Final       COMMENT                   NO GROWTH AEROBICALLY/ANAEROBICALLY IN 5 DAYS   ANTIBIOTIC                                                      Culture, blood (single)     Status: None   Collection Time: 09/28/12  8:26 AM  Result Value Ref Range Status   Micro Text Report   Final       COMMENT                   NO GROWTH AEROBICALLY/ANAEROBICALLY IN 5 DAYS   ANTIBIOTIC  Coagulation Studies: No results for input(s): LABPROT, INR in the last 72 hours.  Urinalysis: No results for input(s): COLORURINE, LABSPEC, PHURINE, GLUCOSEU, HGBUR, BILIRUBINUR, KETONESUR, PROTEINUR, UROBILINOGEN, NITRITE, LEUKOCYTESUR in the last 72 hours.  Invalid input(s): APPERANCEUR    Imaging: Dg Chest 2 View  10/21/2015  CLINICAL DATA:  History CHF with generalized weakness EXAM: CHEST  2 VIEW COMPARISON:  09/09/2015 FINDINGS: Cardiac shadow is stable. The lungs are well aerated bilaterally and demonstrate increased patchy infiltrate in the bases. Small bilateral pleural effusions are seen. No other focal abnormality is noted. IMPRESSION: New bibasilar patchy infiltrates. Electronically Signed   By: Alcide Clever M.D.   On: 10/21/2015 08:10     Medications:     . amLODipine  10 mg Oral Daily  . aspirin EC  81 mg Oral Daily  . atorvastatin  80 mg Oral QHS  . calcium acetate  667 mg Oral Q breakfast  . carvedilol  25 mg Oral BID WC  . clopidogrel  75 mg Oral QHS  . doxazosin  1 mg Oral QHS  . finasteride  5 mg Oral QHS  . FLUoxetine  40 mg Oral Daily   . heparin  5,000 Units Subcutaneous 3 times per day  . insulin aspart  0-15 Units Subcutaneous TID WC  . insulin aspart  0-5 Units Subcutaneous QHS  . isosorbide mononitrate  120 mg Oral Daily  . [START ON 10/23/2015] levofloxacin  500 mg Oral Q48H  . levothyroxine  100 mcg Oral QAC breakfast  . linagliptin  5 mg Oral QHS  . lisinopril  40 mg Oral QHS  . living well with diabetes book- in spanish   Does not apply Once  . LORazepam  0.5 mg Oral QHS  . pantoprazole  40 mg Oral Daily  . [START ON 10/22/2015] pneumococcal 23 valent vaccine  0.5 mL Intramuscular Tomorrow-1000  . sodium chloride flush  3 mL Intravenous Q12H   acetaminophen **OR** acetaminophen, alum & mag hydroxide-simeth, hydrALAZINE, HYDROcodone-acetaminophen, ondansetron **OR** ondansetron (ZOFRAN) IV, senna-docusate  Assessment/ Plan:  Mr. Zymarion Favorite is a 50 y.o.  Hispanic male with uncontrolled diabetes mellitus type II, hypertension, hyperlipidemia, hypothyroidism, BPH, coronary artery disease status post stent.   [facility]  1. End stage renal disease: seen and examined on hemodialysis. Tolerating treatment well. Uf of 1.5 litres.  Next treatment for Thursday.   2. Syncope: concerning for cardiogenic cause. Echocardiogram complete, pending reading.  - Check carotid dopplers. Check CT head. Consult cardiology.   3. Hypertension: not well controlled. Will let blood pressure ride high due to recent syncope - amlodipine, carvedilol, doxazosin, imdur, lisinopril.   4. Anemia of chronic kidney disease: hemoglobin 9.2  - micera as outpatient. Held epo due to concern of ischemic event.   5. Secondary Hyperparathyroidism: Calcium at goal.  - Continue calcium acetate with meals.  - Check PTH and phosphorus  6. Pneumonia: elevated white count. Afebrile.  - levofloxacin.   7. Diabetes Mellitus type II with chronic kidney disease: not well controlled.  - check hemoglobin A1c   LOS:  0 Natividad Schlosser 2/14/20174:02 PM

## 2015-10-21 NOTE — ED Notes (Signed)
Dr Alphonzo Lemmings notified blood glucose 626 and NA 116.

## 2015-10-21 NOTE — ED Provider Notes (Addendum)
[facility] Emergency Department Provider Note  ____________________________________________   I have reviewed the triage vital signs and the nursing notes.   HISTORY  Chief Complaint Fall    HPI Marcus [name] is a 50 y.o. male with a history of CHF, BPH, Tuesday Thursday Saturday dialysis, insulin-dependent diabetes, hypertension, CAD, presents today complaining of feeling generally weak. History is per patient, his family member, and the [place] interpreter is present. There was no closed head injury. The patient is adamant about this. I'm not sure whether triage note got the idea that he had a head injury but he states all he did was sitting to his knees because he felt weak. Patient has had dialysis most recently last Saturday which was his normal dialysis. He states it took off 2 L which was the normal amount. He does have a history of diabetes, he states he is taking his medication and his sugars have been to the extent that he can tell me adequately controlled until morning. He denies any fever or chills. He does feel short of breath. He states he's been in and out of the hospital over the last 6 weeks at other facilities for shortness of breath and he is following up with the pulmonologist. He is not on home oxygen. He does have some degree of orthopnea. He denies any abdominal pain. He denies any diarrhea. He no longer makes urine. He denies any chest pain at this time. He states he just feels generally weak and this is the impression of his family as well.  Past Medical History  Diagnosis Date  . Dialysis patient (HCC)   . Renal disorder   . Hypertension   . Diabetes mellitus without complication (HCC)   . Thyroid disease   . CHF (congestive heart failure) (HCC)   . Depression   . BPH (benign prostatic hyperplasia)   . High cholesterol     There are no active problems to display for this patient.   No past surgical history on file.  No current  outpatient prescriptions on file.  Allergies Review of patient's allergies indicates no known allergies.  No family history on file.  Social History Social History  Substance Use Topics  . Smoking status: Current Every Day Smoker  . Smokeless tobacco: Not on file  . Alcohol Use: Not on file    Review of Systems Constitutional: No fever/chills Eyes: No visual changes. ENT: No sore throat. No stiff neck no neck pain Cardiovascular: Denies chest pain. Respiratory: See history of present illness Gastrointestinal:   no vomiting.  No diarrhea.  No constipation. Genitourinary: Negative for dysuria. Musculoskeletal: Negative lower extremity swelling Skin: Negative for rash. Neurological: Negative for headaches, focal weakness or numbness. 10-point ROS otherwise negative.  ____________________________________________   PHYSICAL EXAM:  VITAL SIGNS: ED Triage Vitals  Enc Vitals Group     BP 10/21/15 0648 165/71 mmHg     Pulse Rate 10/21/15 0648 83     Resp 10/21/15 0648 18     Temp 10/21/15 0648 97.7 F (36.5 C)     Temp src --      SpO2 10/21/15 0648 85 %     Weight --      Height --      Head Cir --      Peak Flow --      Pain Score 10/21/15 0649 8     Pain Loc --      Pain Edu? --  Excl. in GC? --     Constitutional: Alert and oriented. Well appearing and in no acute distress. Eyes: Conjunctivae are normal. . EOMI. Head: Atraumatic. Nose: No congestion/rhinnorhea. Mouth/Throat: Mucous membranes are moist.  Oropharynx non-erythematous. Neck: No stridor.   Nontender with no meningismus Cardiovascular: Normal rate, regular rhythm. Grossly normal heart sounds.  Good peripheral circulation. Respiratory: Normal respiratory effort.  No retractions. There is bibasilar rails. Abdominal: Soft and nontender. No distention. No guarding no rebound Back:  There is no focal tenderness or step off there is no midline tenderness there are no lesions noted. there is no CVA  tenderness Musculoskeletal: No lower extremity tenderness. No joint effusions, no DVT signs strong distal pulses slight bilateral symmetric edema Neurologic:  Normal speech and language. No gross focal neurologic deficits are appreciated.  Skin:  Skin is warm, dry and intact. No rash noted. Psychiatric: Mood and affect are normal. Speech and behavior are normal.  ____________________________________________   LABS (all labs ordered are listed, but only abnormal results are displayed)  Labs Reviewed  CBC - Abnormal; Notable for the following:    WBC 16.0 (*)    RBC 3.21 (*)    Hemoglobin 10.5 (*)    HCT 30.0 (*)    RDW 14.6 (*)    All other components within normal limits  COMPREHENSIVE METABOLIC PANEL - Abnormal; Notable for the following:    Sodium 116 (*)    Potassium 5.8 (*)    Chloride 79 (*)    CO2 21 (*)    Glucose, Bld 626 (*)    BUN 59 (*)    Creatinine, Ser 9.79 (*)    Calcium 8.3 (*)    ALT 13 (*)    GFR calc non Af Amer 5 (*)    GFR calc Af Amer 6 (*)    Anion gap 16 (*)    All other components within normal limits  GLUCOSE, CAPILLARY - Abnormal; Notable for the following:    Glucose-Capillary >600 (*)    All other components within normal limits  BRAIN NATRIURETIC PEPTIDE - Abnormal; Notable for the following:    B Natriuretic Peptide 2770.0 (*)    All other components within normal limits  CULTURE, BLOOD (ROUTINE X 2)  CULTURE, BLOOD (ROUTINE X 2)  TROPONIN I  LACTIC ACID, PLASMA  LACTIC ACID, PLASMA   ____________________________________________  EKG  I personally interpreted any EKGs ordered by me or triage Sinus rhythm rate 81 bpm, likely strain pattern in the lateral fields, no acute ST elevation, repolarization abnormality also noted. ____________________________________________  RADIOLOGY  I reviewed any imaging ordered by me or triage that were performed during my  shift ____________________________________________   PROCEDURES  Procedure(s) performed: None  Critical Care performed: CRITICAL CARE Performed by: Jeanmarie Plant   Total critical care time: 38 minutes  Critical care time was exclusive of separately billable procedures and treating other patients.  Critical care was necessary to treat or prevent imminent or life-threatening deterioration.  Critical care was time spent personally by me on the following activities: development of treatment plan with patient and/or surrogate as well as nursing, discussions with consultants, evaluation of patient's response to treatment, examination of patient, obtaining history from patient or surrogate, ordering and performing treatments and interventions, ordering and review of laboratory studies, ordering and review of radiographic studies, pulse oximetry and re-evaluation of patient's condition.   ____________________________________________   INITIAL IMPRESSION / ASSESSMENT AND PLAN / ED COURSE  Pertinent labs & imaging results  that were available during my care of the patient were reviewed by me and considered in my medical decision making (see chart for details).  Patient with multiple different from sleep. The first is that he felt weak and sagged to the ground. He denies head injury he is awake and alert, I am reluctant to try to get a CT scan of his head as I think lying flat will cause some significant difficulty. Just moving him in the bed caused him to desaturate even on 2 L of oxygen I have put him up to 3 L. Which brings [place] to #2, patient has hypoxia upon arrival and has bibasilar rails. I suspect he is volume overloaded and and requires dialysis today. He does not make urine, there is no other way to get the fluid off. We will obtain a chest x-ray to make sure that there is no pneumonia or other pathology seen but at this time his history and physical are most consistent with volume  overload in a dialysis patient. Which brings [place] to  #3, his sugar is over 600, it is unclear why this should be. He denies any recent fever or cough and apparently according to family he was in his normal state of health such as it is until last night. He apparently is he states compliant with his diabetes medication. Obviously we cannot give him IV fluids for his elevated sugar we will have to give him insulin to bring it down. He may even require an insulin drip although again we do not wish to give him a lot of fluid.   ----------------------------------------- 8:16 AM on 10/21/2015 -----------------------------------------  Chest x-ray consistent with pneumonia which goes along with the patient's cough. Also elevated white count as noted. This also could account for his elevated sugar. We will give him Levaquin IV is sent as an IV antibiotic which will follow not require a large fluid bolus. We are giving him IV insulin to start his sugar coming down and if he does not respond well to that he will likely require drip. Patient needs to be admitted obviously. The cultures are being obtained and we'll send a lactic although his pressures of an good. Sats are in the high 90s right now on 3 L when he sits straight up.       ____________________________________________   FINAL CLINICAL IMPRESSION(S) / ED DIAGNOSES  Final diagnoses:  Aspiration pneumonia of right lung, unspecified aspiration pneumonia type, unspecified part of lung (HCC)  Hyperglycemia  Hypoxia      This chart was dictated using voice recognition software.  Despite best efforts to proofread,  errors can occur which can change meaning.     Jeanmarie Plant, MD 10/21/15 0740  Jeanmarie Plant, MD 10/21/15 1610  Jeanmarie Plant, MD 10/21/15 [phone]

## 2015-10-21 NOTE — Progress Notes (Signed)
Post hd tx 

## 2015-10-21 NOTE — Progress Notes (Signed)
Patient BS 442, dr Juliene Pina notified, order to give 15 units novolog subq.

## 2015-10-22 ENCOUNTER — Inpatient Hospital Stay (HOSPITAL_COMMUNITY)
Admit: 2015-10-22 | Discharge: 2015-10-22 | Disposition: A | Discharge status: Home / Self Care | Payer: Medicaid Other | Attending: Cardiovascular Disease | Admitting: Cardiovascular Disease

## 2015-10-22 ENCOUNTER — Encounter: Payer: Self-pay | Admitting: Student

## 2015-10-22 DIAGNOSIS — J189 Pneumonia, unspecified organism: Secondary | ICD-10-CM

## 2015-10-22 DIAGNOSIS — R55 Syncope and collapse: Secondary | ICD-10-CM | POA: Insufficient documentation

## 2015-10-22 DIAGNOSIS — I5032 Chronic diastolic (congestive) heart failure: Secondary | ICD-10-CM

## 2015-10-22 DIAGNOSIS — J690 Pneumonitis due to inhalation of food and vomit: Secondary | ICD-10-CM | POA: Insufficient documentation

## 2015-10-22 DIAGNOSIS — I5021 Acute systolic (congestive) heart failure: Secondary | ICD-10-CM

## 2015-10-22 DIAGNOSIS — Z992 Dependence on renal dialysis: Secondary | ICD-10-CM

## 2015-10-22 DIAGNOSIS — I2510 Atherosclerotic heart disease of native coronary artery without angina pectoris: Secondary | ICD-10-CM

## 2015-10-22 DIAGNOSIS — R739 Hyperglycemia, unspecified: Secondary | ICD-10-CM | POA: Insufficient documentation

## 2015-10-22 DIAGNOSIS — N186 End stage renal disease: Secondary | ICD-10-CM

## 2015-10-22 LAB — RENAL FUNCTION PANEL
ALBUMIN: 3.3 g/dL — AB (ref 3.5–5.0)
ANION GAP: 10 (ref 5–15)
BUN: 35 mg/dL — ABNORMAL HIGH (ref 6–20)
CALCIUM: 8.3 mg/dL — AB (ref 8.9–10.3)
CO2: 30 mmol/L (ref 22–32)
Chloride: 93 mmol/L — ABNORMAL LOW (ref 101–111)
Creatinine, Ser: 6.73 mg/dL — ABNORMAL HIGH (ref 0.61–1.24)
GFR, EST AFRICAN AMERICAN: 10 mL/min — AB (ref >60)
GFR, EST NON AFRICAN AMERICAN: 9 mL/min — AB (ref >60)
GLUCOSE: 251 mg/dL — AB (ref 65–99)
PHOSPHORUS: 4.2 mg/dL (ref 2.5–4.6)
Potassium: 4.9 mmol/L (ref 3.5–5.1)
SODIUM: 133 mmol/L — AB (ref 135–145)

## 2015-10-22 LAB — HEPATITIS B CORE ANTIBODY, TOTAL: Hep B Core Total Ab: NEGATIVE

## 2015-10-22 LAB — GLUCOSE, CAPILLARY
GLUCOSE-CAPILLARY: 273 mg/dL — AB (ref 65–99)
GLUCOSE-CAPILLARY: 211 mg/dL — AB (ref 65–99)
GLUCOSE-CAPILLARY: 135 mg/dL — AB (ref 65–99)
Glucose-Capillary: 296 mg/dL — ABNORMAL HIGH (ref 65–99)

## 2015-10-22 LAB — CBC
HCT: 25.7 % — ABNORMAL LOW (ref 40.0–52.0)
HEMOGLOBIN: 9.3 g/dL — AB (ref 13.0–18.0)
MCH: 33.9 pg (ref 26.0–34.0)
MCHC: 36.1 g/dL — ABNORMAL HIGH (ref 32.0–36.0)
MCV: 94.1 fL (ref 80.0–100.0)
Platelets: 153 10*3/uL (ref 150–440)
RBC: 2.73 MIL/uL — AB (ref 4.40–5.90)
RDW: 14.7 % — ABNORMAL HIGH (ref 11.5–14.5)
WBC: 9.7 10*3/uL (ref 3.8–10.6)

## 2015-10-22 LAB — HEPATITIS B SURFACE ANTIGEN: Hepatitis B Surface Ag: NEGATIVE

## 2015-10-22 LAB — MRSA PCR SCREENING: MRSA by PCR: NEGATIVE

## 2015-10-22 LAB — HEMOGLOBIN A1C: HEMOGLOBIN A1C: 9.8 % — AB (ref 4.0–6.0)

## 2015-10-22 LAB — HEPATITIS B SURFACE ANTIBODY,QUALITATIVE: Hep B S Ab: REACTIVE

## 2015-10-22 MED ORDER — INSULIN ASPART PROT & ASPART (70-30 MIX) 100 UNIT/ML ~~LOC~~ SUSP
10.0000 [IU] | Freq: Two times a day (BID) | SUBCUTANEOUS | Status: DC
  Administered 2015-10-22: 10 [IU] via SUBCUTANEOUS
  Filled 2015-10-22: qty 10

## 2015-10-22 NOTE — Progress Notes (Signed)
Walked patient on room air.  No distress with oxygen saturations dropping.  Patient heart rate remained stable.  Rhythm  became irradiate while walking, no loss of consciousness.  Patient did appear to sway from side to side while walking until was able to touch side rail for support.

## 2015-10-22 NOTE — Plan of Care (Signed)
Problem: Skin Integrity: Goal: Risk for impaired skin integrity will decrease Outcome: Not Progressing Wounds to head from scratching.  Problem: Activity: Goal: Risk for activity intolerance will decrease Outcome: Progressing Walked out into hall with patient.  Tolerated well.

## 2015-10-22 NOTE — Consult Note (Signed)
Cardiology Consult    Patient ID: Marcus [name] MRN: [id number], DOB/AGE: 05/20/1966   Admit date: 10/21/2015 Date of Consult: 10/22/2015  Primary Physician: [facility] [place] Reason for Consult: Syncope, History of CAD Primary Cardiologist: Dr. Graciela Husbands [facility]) Requesting Provider: Dr. Wynelle Link  History of Present Illness    Marcus [name] is a 50 y.o. male with past medical history of CAD (s/p multiple PCI to LCx and OM1), chronic diastolic CHF, Type 2 DM, HTN, HLD, ESRD (on HD T/Th/Sat), and questionable ASD who presented to [facility] on 10/21/2015 for increasing weakness and shortness of breath.   While in the ED, a CXR showed new bibasilar patchy infiltrates and he was started on Levaquin for CAP. Due to concern of a head injury, CT imaging was obtained which no acute intracranial abnormalities. WBC elevated to 16.0. Hgb 10.5. Platelets 176. Initial troponin negative. BNP 2770. A1c 9.8.  Yesterday afternoon while at dialysis, he had a syncopal episode and reported these occurring frequently. It is unclear what his BP was at that time. Carotid US was obtained which showed < 50% stenosis in the right and left internal carotid arteries. A Cardiology consult was called to evaluate his syncope.   In talking with the patient using a Spanish Interpreter, he reports having lower extremity weakness for the past 1.5 months. He reports the weakness started following him having to have daily dialysis at [facility] due to having "fluid in the lungs and his heart". His wife reports the weakness was occurring prior to this as well. He is now having 2-3 episodes of weakness per week on average, sometimes twice a day. Reports this occurs when he is walking and  develops sudden onset of weakness in his legs and requires assistance from his wife to stand back up. He denies any symptoms leading up to this including lightheadedness, dizziness, nausea, vomiting, pain, headache, or dyspnea. Says he has lost  consciousness twice, once last Friday and yesterday morning prior to dialysis. Believes his LOC lasted several seconds. He has not noticed an association between his weakness and his dialysis sessions, for his LOC occurred on his non-dialysis day and the episode yesterday occurred prior to dialysis.  He denies any recent chest pain, palpitations, or dyspnea on exertion. Reports having generalized weakness over the past several days which prompted him to seek evaluation in the ED. Says his BP is "always high" at home and reports SBP in the 170's - 210's. Reports his BP has been elevated like this for years and his Cardiologist has titrated his medications over the past year.   In reviewing Care Everywhere, he is closely followed by Dr. Elijah Birk at [facility]. Appears his last ischemic evaluation was a PET stress test in 09/2013 which was a normal study with EF of 48%. Last cardiac catheterization was in 07/2014 when POBA was utilized for his 70% in-stent restenosis in the LCx. Placed on ASA and Plavix at that time. Was last seen in their office in 12/2014 and reported having episodes of chest pain and exertional dyspnea, having to take his SL NTG more frequently. Further ischemic evaluation was recommended at that time but the patient declined further workup. The note from that visit makes mention of a possible ASD on recent echo but the views were sub-optimal. Doppler views showed a L --> R shunt, so it was thought this was likely not contributing to his dyspnea. Their recommendation at that time was just to monitor it closely since correcting an ASD  would likely not provide much benefit. His last echocardiogram was in 08/18/2015 and showed an EF of  55 to 60% and Grade 2 DD. Degenerative mitral valve disease and mild to moderate elevated pulmonary artery systolic pressures were also noted.   Past Medical History   Past Medical History  Diagnosis Date  . Dialysis patient (HCC)   . Renal disorder   .  Hypertension   . Diabetes mellitus without complication (HCC)   . Thyroid disease   . CHF (congestive heart failure) (HCC)   . Depression   . BPH (benign prostatic hyperplasia)   . High cholesterol     History reviewed. No pertinent past surgical history.   Allergies  No Known Allergies  Inpatient Medications    . amLODipine  10 mg Oral Daily  . aspirin EC  81 mg Oral Daily  . atorvastatin  80 mg Oral QHS  . calcium acetate  667 mg Oral Q breakfast  . carvedilol  25 mg Oral BID WC  . clopidogrel  75 mg Oral QHS  . doxazosin  1 mg Oral QHS  . finasteride  5 mg Oral QHS  . FLUoxetine  40 mg Oral Daily  . heparin  5,000 Units Subcutaneous 3 times per day  . insulin aspart  0-15 Units Subcutaneous TID WC  . insulin aspart  0-5 Units Subcutaneous QHS  . isosorbide mononitrate  120 mg Oral Daily  . [START ON 10/23/2015] levofloxacin  500 mg Oral Q48H  . levothyroxine  100 mcg Oral QAC breakfast  . linagliptin  5 mg Oral QHS  . lisinopril  40 mg Oral QHS  . living well with diabetes book- in spanish   Does not apply Once  . LORazepam  0.5 mg Oral QHS  . pantoprazole  40 mg Oral Daily  . pneumococcal 23 valent vaccine  0.5 mL Intramuscular Tomorrow-1000  . sodium chloride flush  3 mL Intravenous Q12H    Family History    History reviewed. No pertinent family history.  Social History    Social History   Social History  . Marital Status: Single    Spouse Name: N/A  . Number of Children: N/A  . Years of Education: N/A   Occupational History  . Not on file.   Social History Main Topics  . Smoking status: Current Every Day Smoker  . Smokeless tobacco: Not on file  . Alcohol Use: Not on file  . Drug Use: Not on file  . Sexual Activity: Not on file   Other Topics Concern  . Not on file   Social History Narrative     Review of Systems    General:  No chills, fever, night sweats or weight changes.  Cardiovascular:  No chest pain, dyspnea on exertion, edema,  orthopnea, palpitations, paroxysmal nocturnal dyspnea. Dermatological: No rash, lesions/masses Respiratory: No cough, dyspnea Urologic: No hematuria, dysuria Abdominal:   No nausea, vomiting, diarrhea, bright red blood per rectum, melena, or hematemesis Neurologic:  No visual changes, changes in mental status. Positive for leg weakness and headaches. All other systems reviewed and are otherwise negative except as noted above.  Physical Exam    Blood pressure 154/66, pulse 80, temperature 98.6 F (37 C), temperature source Oral, resp. rate 18, height 5\' 2"  (1.575 m), weight 154 lb 14.4 oz (70.262 kg), SpO2 100 %.  General: Pleasant, middle-aged Hispanic male appearing in NAD. Psych: Normal affect. Neuro: Alert and oriented X 3. Moves all extremities spontaneously. HEENT: Normal  Neck:  Supple without bruits or JVD. Lungs:  Resp regular and unlabored, CTA without wheezing or rales. Heart: RRR no s3, s4, or murmurs. Abdomen: Soft, non-tender, non-distended, BS + x 4.  Extremities: No clubbing, cyanosis or edema. DP/PT/Radials 2+ and equal bilaterally.  Labs    Troponin (Point of Care Test) No results for input(s): TROPIPOC in the last 72 hours.  Recent Labs  10/21/15 0654  TROPONINI <0.03   Lab Results  Component Value Date   WBC 9.7 10/22/2015   HGB 9.3* 10/22/2015   HCT 25.7* 10/22/2015   MCV 94.1 10/22/2015   PLT 153 10/22/2015    Recent Labs Lab 10/21/15 0654 10/21/15 1308  NA 116* 126*  K 5.8* 5.4*  CL 79* 88*  CO2 21* 23  BUN 59* 66*  CREATININE 9.79* 10.19*  CALCIUM 8.3* 8.8*  PROT 8.0  --   BILITOT 1.1  --   ALKPHOS 111  --   ALT 13*  --   AST 16  --   GLUCOSE 626* 270*   No results found for: CHOL, HDL, LDLCALC, TRIG No results found for: [facility]   Radiology Studies    Dg Chest 2 View: 10/21/2015  CLINICAL DATA:  History CHF with generalized weakness EXAM: CHEST  2 VIEW COMPARISON:  09/09/2015 FINDINGS: Cardiac shadow is stable. The lungs are well  aerated bilaterally and demonstrate increased patchy infiltrate in the bases. Small bilateral pleural effusions are seen. No other focal abnormality is noted. IMPRESSION: New bibasilar patchy infiltrates. Electronically Signed   By: Alcide Clever M.D.   On: 10/21/2015 08:10   Ct Head Wo Contrast: 10/21/2015  CLINICAL DATA:  Syncope and collapse at dialysis. EXAM: CT HEAD WITHOUT CONTRAST TECHNIQUE: Contiguous axial images were obtained from the base of the skull through the vertex without intravenous contrast. COMPARISON:  None. FINDINGS: No evidence of parenchymal hemorrhage or extra-axial fluid collection. No mass lesion, mass effect, or midline shift. No CT evidence of acute infarction. Relatively symmetric normal variant globus pallidus calcifications. Intracranial atherosclerosis. Cerebral volume is age appropriate. No ventriculomegaly. Mild mucosal thickening throughout the ethmoidal air cells. No fluid levels in the visualized paranasal sinuses. The mastoid air cells are unopacified. No evidence of calvarial fracture. IMPRESSION: 1. No evidence of acute intracranial abnormality. No calvarial fracture. 2. Intracranial atherosclerosis. Electronically Signed   By: Delbert Phenix M.D.   On: 10/21/2015 18:52   US Carotid Bilateral: 10/21/2015  CLINICAL DATA:  Syncope EXAM: BILATERAL CAROTID DUPLEX ULTRASOUND TECHNIQUE: Wallace Cullens scale imaging, color Doppler and duplex ultrasound were performed of bilateral carotid and vertebral arteries in the neck. COMPARISON:  None. FINDINGS: Criteria: Quantification of carotid stenosis is based on velocity parameters that correlate the residual internal carotid diameter with NASCET-based stenosis levels, using the diameter of the distal internal carotid lumen as the denominator for stenosis measurement. The following velocity measurements were obtained: RIGHT ICA:  129 cm/sec CCA:  100 cm/sec SYSTOLIC ICA/CCA RATIO:  1.3 DIASTOLIC ICA/CCA RATIO:  1.5 ECA:  267 cm/sec LEFT ICA:  140  cm/sec CCA:  123 cm/sec SYSTOLIC ICA/CCA RATIO:  1.1 DIASTOLIC ICA/CCA RATIO:  1.8 ECA:  221 cm/sec RIGHT CAROTID ARTERY: Mild irregular plaque in the mid common carotid. Mild calcified plaque in the bulb. Low resistance internal carotid Doppler pattern. RIGHT VERTEBRAL ARTERY:  Antegrade.  Normal Doppler pattern. LEFT CAROTID ARTERY: Mild calcified plaque in the bulb. Low resistance internal carotid Doppler pattern. LEFT VERTEBRAL ARTERY:  Antegrade.  Normal Doppler pattern. IMPRESSION: Less than 50% stenosis in  the right and left internal carotid arteries. Electronically Signed   By: Jolaine Click M.D.   On: 10/21/2015 21:48    EKG & Cardiac Imaging    EKG: No tracing this admission.  Echocardiogram: 08/18/2015 (In Care Everywhere) Normal left ventricular systolic function, ejection fraction 55 to 60%  Left ventricular hypertrophy - moderate  Diastolic dysfunction - grade II (elevated filling pressures)  Degenerative mitral valve disease  Dilated left atrium - mild  Dilated right ventricle - mild  Normal right ventricular systolic function  Tricuspid regurgitation - mild  Elevated pulmonary artery systolic pressure - mild to moderate  Assessment & Plan    1. Syncope of Unknown Etiology/ Lower Extremity Weakness - reports having lower extremity weakness for the past 1.5 months with two episodes of syncope. Reports feeling weak, falling to the ground, then "blacking out". No prodrome. Symptoms have not occurred in the setting of his dialysis sessions. - no history of cardiac arrhythmias. - Carotid US showed < 50% stenosis bilaterally. - Echo and Lexiscan Myoview are pending. - could consider 30-day event monitor through his primary Cardiologist at the time of discharge, granted his symptoms sound atypical for a cardiac arrhythmia etiology.  2. History of CAD - s/p multiple PCI to LCx and OM1 with last cath in 07/2014 (POBA was utilized for his 70% in-stent restenosis in the LCx at  that time) - denies any recent chest pain or dyspnea on exertion. - Echo pending to assess LV function and wall motion abnormalities. - continue ASA, Plavix, Amlodipine, BB, Imdur, and ACE-I. - will plan for Lexiscan Myoview in the AM. NPO after midnight. Hold morning Coreg dose.  3. Chronic diastolic CHF - echo on 08/18/2015 and showed an EF of 55 to 60% and Grade 2 DD.  - does not appear volume overloaded on physical examination - continue BB and ACE-I - repeat echo pending to assess LV function.  4. Questionable ASD - followed by primary cardiologist at [facility] - repeat echocardiogram is pending  5. Type 2 DM - on SSI  - A1c elevated to 9.8 - per admitting team  6. ESRD - HD Tuesday, Thursday, Saturday - Nephrology following.  7. HTN - BP has been 138/50 - 184/88 while admitted - on Amlodipine 10mg  daily, Coreg 25mg  BID, Cardura 1mg  daily, Imdur 120mg  daily, and Lisinopril 40mg  daily. - appreciate Nephrology recommendations.  8. HLD - continue statin therapy  9. CAP - WBC elevated to 16.0 on admission. - on Levaquin - per admitting team  Signed, Ellsworth Lennox, PA-C 10/22/2015, 7:51 AM Pager: [phone]

## 2015-10-22 NOTE — Progress Notes (Signed)
SATURATION QUALIFICATIONS: (This note is used to comply with regulatory documentation for home oxygen)  Patient Saturations on Room Air at Rest = 95%  Patient Saturations on Room Air while Ambulating = 92%  Patient Saturations on 0 Liters of oxygen while Ambulating = 92%  Please briefly explain why patient needs home oxygen: no needs

## 2015-10-22 NOTE — Progress Notes (Signed)
[facility] [place] at [facility]   PATIENT NAME: Marcus [name]   MR#:  [id number]  DATE OF BIRTH:  1966/02/04  SUBJECTIVE:    Patient had reported syncopal episodes over the past several weeks in dialysis without any symptoms prior to him passing out. Blood sugars were not checked when these episodes of have happened at home.  REVIEW OF SYSTEMS:    Review of Systems  Constitutional: Negative for fever, chills and malaise/fatigue.  HENT: Negative for sore throat.   Eyes: Negative for blurred vision.  Respiratory: Negative for cough, hemoptysis, shortness of breath and wheezing.   Cardiovascular: Negative for chest pain, palpitations and leg swelling.  Gastrointestinal: Negative for nausea, vomiting, abdominal pain, diarrhea and blood in stool.  Genitourinary: Negative for dysuria.  Musculoskeletal: Negative for back pain.  Neurological: Negative for dizziness, tremors, sensory change, speech change, focal weakness and headaches.  Endo/Heme/Allergies: Does not bruise/bleed easily.    Tolerating Diet: Yes      DRUG ALLERGIES:  No Known Allergies  VITALS:  Blood pressure 172/61, pulse 85, temperature 98.1 F (36.7 C), temperature source Oral, resp. rate 18, height  (1.575 m), weight 70.262 kg (154 lb 14.4 oz), SpO2 100 %.  PHYSICAL EXAMINATION:   Physical Exam  Constitutional: He is oriented to person, place, and time and well-developed, well-nourished, and in no distress. No distress.  HENT:  Head: Normocephalic.  Eyes: No scleral icterus.  Neck: Normal range of motion. Neck supple. No JVD present. No tracheal deviation present.  Cardiovascular: Normal rate, regular rhythm and normal heart sounds.  Exam reveals no gallop and no friction rub.   No murmur heard. Pulmonary/Chest: Effort normal and breath sounds normal. No respiratory distress. He has no wheezes. He has no rales. He exhibits no tenderness.  Abdominal: Soft. Bowel sounds  are normal. He exhibits no distension and no mass. There is no tenderness. There is no rebound and no guarding.  Musculoskeletal: Normal range of motion. He exhibits no edema.  Neurological: He is alert and oriented to person, place, and time.  Skin: Skin is warm. No rash noted. No erythema.  Psychiatric: Affect and judgment normal.      LABORATORY PANEL:   CBC  Recent Labs Lab 10/22/15 0550  WBC 9.7  HGB 9.3*  HCT 25.7*  PLT 153   ------------------------------------------------------------------------------------------------------------------  Chemistries   Recent Labs Lab 10/21/15 0654  10/22/15 0553  NA 116*  < > 133*  K 5.8*  < > 4.9  CL 79*  < > 93*  CO2 21*  < > 30  GLUCOSE 626*  < > 251*  BUN 59*  < > 35*  CREATININE 9.79*  < > 6.73*  CALCIUM 8.3*  < > 8.3*  AST 16  --   --   ALT 13*  --   --   ALKPHOS 111  --   --   BILITOT 1.1  --   --   < > = values in this interval not displayed. ------------------------------------------------------------------------------------------------------------------  Cardiac Enzymes  Recent Labs Lab 10/21/15 0654  TROPONINI <0.03   ------------------------------------------------------------------------------------------------------------------  RADIOLOGY:  Dg Chest 2 View  10/21/2015  CLINICAL DATA:  History CHF with generalized weakness EXAM: CHEST  2 VIEW COMPARISON:  09/09/2015 FINDINGS: Cardiac shadow is stable. The lungs are well aerated bilaterally and demonstrate increased patchy infiltrate in the bases. Small bilateral pleural effusions are seen. No other focal abnormality is noted. IMPRESSION: New bibasilar patchy infiltrates. Electronically Signed  By: Alcide Clever M.D.   On: 10/21/2015 08:10   Ct Head Wo Contrast  10/21/2015  CLINICAL DATA:  Syncope and collapse at dialysis. EXAM: CT HEAD WITHOUT CONTRAST TECHNIQUE: Contiguous axial images were obtained from the base of the skull through the vertex without  intravenous contrast. COMPARISON:  None. FINDINGS: No evidence of parenchymal hemorrhage or extra-axial fluid collection. No mass lesion, mass effect, or midline shift. No CT evidence of acute infarction. Relatively symmetric normal variant globus pallidus calcifications. Intracranial atherosclerosis. Cerebral volume is age appropriate. No ventriculomegaly. Mild mucosal thickening throughout the ethmoidal air cells. No fluid levels in the visualized paranasal sinuses. The mastoid air cells are unopacified. No evidence of calvarial fracture. IMPRESSION: 1. No evidence of acute intracranial abnormality. No calvarial fracture. 2. Intracranial atherosclerosis. Electronically Signed   By: Delbert Phenix M.D.   On: 10/21/2015 18:52   US Carotid Bilateral  10/21/2015  CLINICAL DATA:  Syncope EXAM: BILATERAL CAROTID DUPLEX ULTRASOUND TECHNIQUE: Wallace Cullens scale imaging, color Doppler and duplex ultrasound were performed of bilateral carotid and vertebral arteries in the neck. COMPARISON:  None. FINDINGS: Criteria: Quantification of carotid stenosis is based on velocity parameters that correlate the residual internal carotid diameter with NASCET-based stenosis levels, using the diameter of the distal internal carotid lumen as the denominator for stenosis measurement. The following velocity measurements were obtained: RIGHT ICA:  129 cm/sec CCA:  100 cm/sec SYSTOLIC ICA/CCA RATIO:  1.3 DIASTOLIC ICA/CCA RATIO:  1.5 ECA:  267 cm/sec LEFT ICA:  140 cm/sec CCA:  123 cm/sec SYSTOLIC ICA/CCA RATIO:  1.1 DIASTOLIC ICA/CCA RATIO:  1.8 ECA:  221 cm/sec RIGHT CAROTID ARTERY: Mild irregular plaque in the mid common carotid. Mild calcified plaque in the bulb. Low resistance internal carotid Doppler pattern. RIGHT VERTEBRAL ARTERY:  Antegrade.  Normal Doppler pattern. LEFT CAROTID ARTERY: Mild calcified plaque in the bulb. Low resistance internal carotid Doppler pattern. LEFT VERTEBRAL ARTERY:  Antegrade.  Normal Doppler pattern. IMPRESSION:  Less than 50% stenosis in the right and left internal carotid arteries. Electronically Signed   By: Jolaine Click M.D.   On: 10/21/2015 21:48     ASSESSMENT AND PLAN:   50 year old male with history of end-stage renal disease who presents with acute hypoxic respiratory failure.  1. Acute hypoxic respiratory failure: This is secondary to community-acquired pneumonia  Continue to wean oxygen as tolerated. Follow-up echocardiogram. Continue and about except for community-acquired pneumonia.  2. Community-acquired pneumonia: Continue Levaquin and follow up on blood cultures   3. ESRD on hematemesis: Continue hemodialysis as per nephrology.  4. Uncontrolled diabetes: Hemoglobin A1c 9.8. Continue ADA diet, NPH and sliding scale and some. Debbie's coordinator consult is placed.   5. History of CAD with stent: Continue Coreg, Plavix, atorvastatin, and lisinopril.  6. Depression: Continue Prozac  7. Hypothyroid: Continue Synthroid  8. Accelerated hypertension: Blood pressure not well controlled. Continue Norvasc, Coreg, lisinopril and isosorbide. Patient will likely need clonidine or hydralazine if blood pressure is not better controlled after dialysis.  9. Syncope: Patient has had several syncopal episodes in the past. Cardiology has been consulted. Follow up on 2-D echocardiogram. Carotid ultrasound showed no hemodynamically significant stenosis.   Management plans discussed with the patient and he is in agreement.  CODE STATUS: FULL  TOTAL TIME TAKING CARE OF THIS PATIENT: 30 minutes.     POSSIBLE D/C 1-2 days, DEPENDING ON CLINICAL CONDITION.   Consandra Laske M.D on 10/22/2015 at 11:43 AM  Between 7am to 6pm - Pager - [phone] After  6pm go to [email or web address] - password [email or web address] [facility]  [place] [facility]  Office  [phone]  CC: Primary care physician; [facility] [place]  Note: This dictation was prepared with Dragon dictation along with smaller phrase  technology. Any transcriptional errors that result from this process are unintentional.

## 2015-10-22 NOTE — Progress Notes (Signed)
*  PRELIMINARY RESULTS* Echocardiogram 2D Echocardiogram has been performed.  Marcus [name] 10/22/2015, 10:54 AM

## 2015-10-22 NOTE — Progress Notes (Addendum)
Interpreter is coming in the AM on 10/23/15 at 12 pm.  Reviewed "Care Everywhere", and according to [facility] notes, patient was on NPH 5 units bid.  However Medication reconciliation notes that patient was on Novolin 70/30 20 units bid.  MD has ordered Novolog 70/30 10 units bid.  Will follow and attempt to clarify home dose on 10/23/15.    Thanks, Beryl Meager, RN, BC-ADM Inpatient Diabetes Coordinator Pager [phone] (8a-5p)

## 2015-10-22 NOTE — Progress Notes (Signed)
Inpatient Diabetes Program Recommendations  AACE/ADA: New Consensus Statement on Inpatient Glycemic Control (2015)  Target Ranges:  Prepandial:   less than 140 mg/dL      Peak postprandial:   less than 180 mg/dL (1-2 hours)      Critically ill patients:  140 - 180 mg/dL   Review of Glycemic Control:  Results for Marcus [name], Marcus [name] (MRN [id number]) as of 10/22/2015 10:28  Ref. Range 10/21/2015 10:22 10/21/2015 11:26 10/21/2015 17:31 10/21/2015 21:13 10/22/2015 08:19  Glucose-Capillary Latest Ref Range: 65-99 mg/dL 409 (H) 811 (H) 914 (H) 117 (H) 273 (H)    Diabetes history: Uncontrolled diabetes Outpatient Diabetes medications: Novolin 70/30 20 units bid Current orders for Inpatient glycemic control:  Novolog moderate tid with meals and HS  Inpatient Diabetes Program Recommendations:  Please restart 1/2 of patient's home dose of 70/30-  Consider 10 units 70/30 bid with meals.  Patient speaks Spanish and wife speaks very little english.  It appears that PCP is at [facility].  I have called to arrange interpreter to speak with patient regarding home diabetes management.  Will follow.  Thanks, Beryl Meager, RN, BC-ADM Inpatient Diabetes Coordinator Pager [phone] (8a-5p)

## 2015-10-22 NOTE — Progress Notes (Signed)
[facility]  ROUNDING NOTE   Subjective:   Hemodialysis yesterday. Tolerated treatment well. UF of 1.5 litres. Breathing better this morning with 2litres [place].  Wife at bedside. Spanish interpreter to get history.   Objective:  Vital signs in last 24 hours:  Temp:  [98 F (36.7 C)-99.7 F (37.6 C)] 98.1 F (36.7 C) (02/15 0821) Pulse Rate:  [76-86] 85 (02/15 0821) Resp:  [15-23] 18 (02/15 0534) BP: (138-184)/(50-88) 172/61 mmHg (02/15 0821) SpO2:  [94 %-100 %] 100 % (02/15 0821) Weight:  [69 kg (152 lb 1.9 oz)-70.262 kg (154 lb 14.4 oz)] 70.262 kg (154 lb 14.4 oz) (02/15 0535)  Weight change:  Filed Weights   10/21/15 1300 10/21/15 1609 10/22/15 0535  Weight: 70.035 kg (154 lb 6.4 oz) 69 kg (152 lb 1.9 oz) 70.262 kg (154 lb 14.4 oz)    Intake/Output: I/O last 3 completed shifts: In: 240 [P.O.:240] Out: 1500 [Other:1500]   Intake/Output this shift:  Total I/O In: 480 [P.O.:480] Out: -   Physical Exam: General: NAD, laying in bed  Head: Normocephalic, atraumatic. Moist oral mucosal membranes  Eyes: Anicteric, PERRL  Neck: Supple, trachea midline  Lungs:  Bibasilar rales.   Heart: Regular rate and rhythm  Abdomen:  Soft, nontender,   Extremities: no peripheral edema.  Neurologic: Nonfocal, moving all four extremities  Skin: No lesions  Access: Left forearm AVF    Basic Metabolic Panel:  Recent Labs Lab 10/21/15 0654 10/21/15 1308 10/22/15 0553  NA 116* 126* 133*  K 5.8* 5.4* 4.9  CL 79* 88* 93*  CO2 21* 23 30  GLUCOSE 626* 270* 251*  BUN 59* 66* 35*  CREATININE 9.79* 10.19* 6.73*  CALCIUM 8.3* 8.8* 8.3*  PHOS  --  4.5 4.2    Liver Function Tests:  Recent Labs Lab 10/21/15 0654 10/21/15 1308 10/22/15 0553  AST 16  --   --   ALT 13*  --   --   ALKPHOS 111  --   --   BILITOT 1.1  --   --   PROT 8.0  --   --   ALBUMIN 4.2 3.6 3.3*   No results for input(s): LIPASE, AMYLASE in the last 168 hours. No results for input(s): AMMONIA  in the last 168 hours.  CBC:  Recent Labs Lab 10/21/15 0654 10/21/15 1308 10/22/15 0550  WBC 16.0* 14.9* 9.7  HGB 10.5* 9.2* 9.3*  HCT 30.0* 26.5* 25.7*  MCV 93.4 92.0 94.1  PLT 176 172 153    Cardiac Enzymes:  Recent Labs Lab 10/21/15 0654  TROPONINI <0.03    BNP: Invalid input(s): POCBNP  CBG:  Recent Labs Lab 10/21/15 1022 10/21/15 1126 10/21/15 1731 10/21/15 2113 10/22/15 0819  GLUCAP 442* 374* 120* 117* 273*    Microbiology: Results for orders placed or performed during the hospital encounter of 10/21/15  MRSA PCR Screening     Status: None   Collection Time: 10/21/15  6:34 AM  Result Value Ref Range Status   MRSA by PCR NEGATIVE NEGATIVE Final    Comment:        The GeneXpert MRSA Assay (FDA approved for NASAL specimens only), is one component of a comprehensive MRSA colonization surveillance program. It is not intended to diagnose MRSA infection nor to guide or monitor treatment for MRSA infections.   Culture, blood (routine x 2)     Status: None (Preliminary result)   Collection Time: 10/21/15  8:08 AM  Result Value Ref Range Status   Specimen  Description BLOOD RIGHT HAND  Final   Special Requests BOTTLES DRAWN AEROBIC AND ANAEROBIC  0.5CC  Final   Culture NO GROWTH < 24 HOURS  Final   Report Status PENDING  Incomplete  Culture, blood (routine x 2)     Status: None (Preliminary result)   Collection Time: 10/21/15  8:13 AM  Result Value Ref Range Status   Specimen Description BLOOD RIGHT WRIST  Final   Special Requests BOTTLES DRAWN AEROBIC AND ANAEROBIC  0.5CC  Final   Culture NO GROWTH < 24 HOURS  Final   Report Status PENDING  Incomplete    Coagulation Studies: No results for input(s): LABPROT, INR in the last 72 hours.  Urinalysis: No results for input(s): COLORURINE, LABSPEC, PHURINE, GLUCOSEU, HGBUR, BILIRUBINUR, KETONESUR, PROTEINUR, UROBILINOGEN, NITRITE, LEUKOCYTESUR in the last 72 hours.  Invalid input(s): APPERANCEUR     Imaging: Dg Chest 2 View  10/21/2015  CLINICAL DATA:  History CHF with generalized weakness EXAM: CHEST  2 VIEW COMPARISON:  09/09/2015 FINDINGS: Cardiac shadow is stable. The lungs are well aerated bilaterally and demonstrate increased patchy infiltrate in the bases. Small bilateral pleural effusions are seen. No other focal abnormality is noted. IMPRESSION: New bibasilar patchy infiltrates. Electronically Signed   By: Alcide Clever M.D.   On: 10/21/2015 08:10   Ct Head Wo Contrast  10/21/2015  CLINICAL DATA:  Syncope and collapse at dialysis. EXAM: CT HEAD WITHOUT CONTRAST TECHNIQUE: Contiguous axial images were obtained from the base of the skull through the vertex without intravenous contrast. COMPARISON:  None. FINDINGS: No evidence of parenchymal hemorrhage or extra-axial fluid collection. No mass lesion, mass effect, or midline shift. No CT evidence of acute infarction. Relatively symmetric normal variant globus pallidus calcifications. Intracranial atherosclerosis. Cerebral volume is age appropriate. No ventriculomegaly. Mild mucosal thickening throughout the ethmoidal air cells. No fluid levels in the visualized paranasal sinuses. The mastoid air cells are unopacified. No evidence of calvarial fracture. IMPRESSION: 1. No evidence of acute intracranial abnormality. No calvarial fracture. 2. Intracranial atherosclerosis. Electronically Signed   By: Delbert Phenix M.D.   On: 10/21/2015 18:52   US Carotid Bilateral  10/21/2015  CLINICAL DATA:  Syncope EXAM: BILATERAL CAROTID DUPLEX ULTRASOUND TECHNIQUE: Wallace Cullens scale imaging, color Doppler and duplex ultrasound were performed of bilateral carotid and vertebral arteries in the neck. COMPARISON:  None. FINDINGS: Criteria: Quantification of carotid stenosis is based on velocity parameters that correlate the residual internal carotid diameter with NASCET-based stenosis levels, using the diameter of the distal internal carotid lumen as the denominator for  stenosis measurement. The following velocity measurements were obtained: RIGHT ICA:  129 cm/sec CCA:  100 cm/sec SYSTOLIC ICA/CCA RATIO:  1.3 DIASTOLIC ICA/CCA RATIO:  1.5 ECA:  267 cm/sec LEFT ICA:  140 cm/sec CCA:  123 cm/sec SYSTOLIC ICA/CCA RATIO:  1.1 DIASTOLIC ICA/CCA RATIO:  1.8 ECA:  221 cm/sec RIGHT CAROTID ARTERY: Mild irregular plaque in the mid common carotid. Mild calcified plaque in the bulb. Low resistance internal carotid Doppler pattern. RIGHT VERTEBRAL ARTERY:  Antegrade.  Normal Doppler pattern. LEFT CAROTID ARTERY: Mild calcified plaque in the bulb. Low resistance internal carotid Doppler pattern. LEFT VERTEBRAL ARTERY:  Antegrade.  Normal Doppler pattern. IMPRESSION: Less than 50% stenosis in the right and left internal carotid arteries. Electronically Signed   By: Jolaine Click M.D.   On: 10/21/2015 21:48     Medications:     . amLODipine  10 mg Oral Daily  . aspirin EC  81 mg Oral Daily  .  atorvastatin  80 mg Oral QHS  . calcium acetate  667 mg Oral Q breakfast  . carvedilol  25 mg Oral BID WC  . clopidogrel  75 mg Oral QHS  . doxazosin  1 mg Oral QHS  . finasteride  5 mg Oral QHS  . FLUoxetine  40 mg Oral Daily  . heparin  5,000 Units Subcutaneous 3 times per day  . insulin aspart  0-15 Units Subcutaneous TID WC  . insulin aspart  0-5 Units Subcutaneous QHS  . isosorbide mononitrate  120 mg Oral Daily  . [START ON 10/23/2015] levofloxacin  500 mg Oral Q48H  . levothyroxine  100 mcg Oral QAC breakfast  . linagliptin  5 mg Oral QHS  . lisinopril  40 mg Oral QHS  . living well with diabetes book- in spanish   Does not apply Once  . LORazepam  0.5 mg Oral QHS  . pantoprazole  40 mg Oral Daily  . sodium chloride flush  3 mL Intravenous Q12H   acetaminophen **OR** acetaminophen, alum & mag hydroxide-simeth, hydrALAZINE, HYDROcodone-acetaminophen, ondansetron **OR** ondansetron (ZOFRAN) IV, senna-docusate  Assessment/ Plan:  Mr. Marcus [name] is a 50 y.o.  Hispanic  male with uncontrolled diabetes mellitus type II, hypertension, hyperlipidemia, hypothyroidism, BPH, coronary artery disease status post stent.   [facility]phrology TTS  1. End stage renal disease: tolerated dialysis treatment. Seems to have persistent pulmonary edema on exam.  - Next treatment for Thursday. Continue TTS schedule  2. Syncope: concerning for cardiogenic cause. Echocardiogram complete, pending reading. Carotid doppler reviewed. CT head reviewed Discussed case with cardiology, Dr. Mariah Milling.  - Agree with ischemic work up and cardiac monitoring.  - may need work up for pulmonary hypertension  3. Hypertension: not well controlled. Will let blood pressure ride high due to recent syncope - amlodipine, carvedilol, doxazosin, imdur, lisinopril.   4. Anemia of chronic kidney disease: hemoglobin 9.3 - micera as outpatient. Held epo due to concern of ischemic event.   5. Secondary Hyperparathyroidism: Calcium at goal. Phosphorus at goal. PTH at goal.  - Continue calcium acetate with meals.   6. Pneumonia: elevated white count. Afebrile.  - levofloxacin.   7. Diabetes Mellitus type II with chronic kidney disease: not well controlled. Hemoglobin A1c of 9.8%   LOS: 1 Ashaki Frosch 2/15/20179:49 AM

## 2015-10-23 ENCOUNTER — Inpatient Hospital Stay (HOSPITAL_BASED_OUTPATIENT_CLINIC_OR_DEPARTMENT_OTHER): Payer: Medicaid Other

## 2015-10-23 ENCOUNTER — Encounter: Payer: Self-pay | Admitting: Radiology

## 2015-10-23 DIAGNOSIS — R55 Syncope and collapse: Secondary | ICD-10-CM

## 2015-10-23 LAB — CBC
HEMATOCRIT: 25.8 % — AB (ref 40.0–52.0)
HEMOGLOBIN: 9.4 g/dL — AB (ref 13.0–18.0)
MCH: 33.8 pg (ref 26.0–34.0)
MCHC: 36.5 g/dL — AB (ref 32.0–36.0)
MCV: 92.8 fL (ref 80.0–100.0)
Platelets: 146 10*3/uL — ABNORMAL LOW (ref 150–440)
RBC: 2.78 MIL/uL — ABNORMAL LOW (ref 4.40–5.90)
RDW: 14.6 % — ABNORMAL HIGH (ref 11.5–14.5)
WBC: 9.7 10*3/uL (ref 3.8–10.6)

## 2015-10-23 LAB — NM MYOCAR MULTI W/SPECT W/WALL MOTION / EF
CHL CUP NUCLEAR SDS: 0
CHL CUP RESTING HR STRESS: 73 {beats}/min
CSEPED: 0 min
CSEPEDS: 0 s
CSEPEW: 1 METS
CSEPPHR: 78 {beats}/min
LV dias vol: 140 mL
LVSYSVOL: 98 mL
MPHR: 169 {beats}/min
Percent HR: 46 %
SRS: 6
SSS: 5
TID: 1.15

## 2015-10-23 LAB — BASIC METABOLIC PANEL
Anion gap: 14 (ref 5–15)
BUN: 53 mg/dL — ABNORMAL HIGH (ref 6–20)
CHLORIDE: 91 mmol/L — AB (ref 101–111)
CO2: 25 mmol/L (ref 22–32)
CREATININE: 8.47 mg/dL — AB (ref 0.61–1.24)
Calcium: 8.3 mg/dL — ABNORMAL LOW (ref 8.9–10.3)
GFR calc non Af Amer: 6 mL/min — ABNORMAL LOW (ref >60)
GFR, EST AFRICAN AMERICAN: 7 mL/min — AB (ref >60)
Glucose, Bld: 165 mg/dL — ABNORMAL HIGH (ref 65–99)
Potassium: 5.3 mmol/L — ABNORMAL HIGH (ref 3.5–5.1)
Sodium: 130 mmol/L — ABNORMAL LOW (ref 135–145)

## 2015-10-23 LAB — GLUCOSE, CAPILLARY
GLUCOSE-CAPILLARY: 95 mg/dL (ref 65–99)
GLUCOSE-CAPILLARY: 178 mg/dL — AB (ref 65–99)
Glucose-Capillary: 151 mg/dL — ABNORMAL HIGH (ref 65–99)

## 2015-10-23 LAB — PARATHYROID HORMONE, INTACT (NO CA): PTH: 167 pg/mL — ABNORMAL HIGH (ref 15–65)

## 2015-10-23 MED ORDER — INSULIN NPH (HUMAN) (ISOPHANE) 100 UNIT/ML ~~LOC~~ SUSP: 10.0000 [IU] | Freq: Every day | SUBCUTANEOUS | Status: DC

## 2015-10-23 MED ORDER — INSULIN ASPART PROT & ASPART (70-30 MIX) 100 UNIT/ML ~~LOC~~ SUSP: 5.0000 [IU] | Freq: Every day | SUBCUTANEOUS | Status: AC

## 2015-10-23 MED ORDER — LEVOFLOXACIN 500 MG PO TABS: 500.0000 mg | ORAL_TABLET | Freq: Every day | ORAL | Status: DC

## 2015-10-23 MED ORDER — INSULIN ASPART PROT & ASPART (70-30 MIX) 100 UNIT/ML ~~LOC~~ SUSP: 10.0000 [IU] | Freq: Every day | SUBCUTANEOUS | Status: DC

## 2015-10-23 MED ORDER — REGADENOSON 0.4 MG/5ML IV SOLN
0.4000 mg | Freq: Once | INTRAVENOUS | Status: AC
  Administered 2015-10-23: 0.4 mg via INTRAVENOUS

## 2015-10-23 MED ORDER — TECHNETIUM TC 99M SESTAMIBI - CARDIOLITE
32.6400 | Freq: Once | INTRAVENOUS | Status: AC | PRN
  Administered 2015-10-23: 10:00:00 32.64 via INTRAVENOUS

## 2015-10-23 MED ORDER — INSULIN NPH ISOPHANE & REGULAR (70-30) 100 UNIT/ML ~~LOC~~ SUSP: 5.0000 [IU] | Freq: Every day | SUBCUTANEOUS | Status: DC

## 2015-10-23 MED ORDER — TECHNETIUM TC 99M SESTAMIBI - CARDIOLITE
13.6750 | Freq: Once | INTRAVENOUS | Status: AC | PRN
  Administered 2015-10-23: 09:00:00 13.675 via INTRAVENOUS

## 2015-10-23 MED ORDER — INSULIN ASPART PROT & ASPART (70-30 MIX) 100 UNIT/ML ~~LOC~~ SUSP: 10.0000 [IU] | Freq: Every day | SUBCUTANEOUS | Status: AC

## 2015-10-23 MED ORDER — INSULIN ASPART PROT & ASPART (70-30 MIX) 100 UNIT/ML ~~LOC~~ SUSP: 5.0000 [IU] | Freq: Every day | SUBCUTANEOUS | Status: DC

## 2015-10-23 NOTE — Care Management Note (Signed)
Attempt was made to place patient to home center today after discharge for his dialysis. Patients normal dialysis time is early a.m. Clinic was enable to accommodate patients need for later arrival time.  Patient is having dialysis here at the hospital prior to discharge later today.  Clinic is expecting patient back on normal schedule Saturday 10/25/15.  Ivor Reining  Dialysis Coordinator  [phone]

## 2015-10-23 NOTE — Care Management (Signed)
Patient went straight to dialysis after stress test. Discharge order is in. Marcus [name] called this RNCM back and stated that patient's dialysis is around [phone] and she will not seek to get outpatient appointment now that patient has already gone to inpatient dialysis.

## 2015-10-23 NOTE — Progress Notes (Signed)
Per MD Hower ok to give morning dose of Norvasc and carvedilol. PT BP 141/54 HR 76.

## 2015-10-23 NOTE — Discharge Summary (Signed)
[facility] [place] at [facility]   PATIENT NAME: Marcus [name]    MR#:  [id number]  DATE OF BIRTH:  16-Jul-1966  DATE OF ADMISSION:  10/21/2015 ADMITTING PHYSICIAN: Adrian Saran, MD  DATE OF DISCHARGE: No discharge date for patient encounter.  PRIMARY CARE PHYSICIAN: [facility] [place]    ADMISSION DIAGNOSIS:  Acute respiratory failure with hypoxia Community acquired pneumonia  DISCHARGE DIAGNOSIS:  Acute respiratory failure with hypoxia secondary to community acquired pneumonia Unspecified syncope  SECONDARY DIAGNOSIS:   Past Medical History  Diagnosis Date  . Dialysis patient [facility])     a. Tue/Th/Sat Schedule  . Hypertension   . Diabetes mellitus without complication (HCC)   . Thyroid disease   . Chronic diastolic CHF (congestive heart failure) (HCC)     a. EF 55-60% by echo in 08/2015; Grade 2 DD noted.  . Depression   . BPH (benign prostatic hyperplasia)   . High cholesterol   . CAD (coronary artery disease) of artery bypass graft     a. s/p multipe PCI to LCx and OMI; Last cath in 07/2014 w/ POBA to 70% stenosis in LCx     HOSPITAL COURSE:  Marcus [name]  is a 50 y.o. male admitted 10/21/2015 with chief complaint Shortness breath. Please see H&P performed by Dr. Juliene Pina further information. presenting with the above complaints. He was evaluated by both nephrology and cardiology during stay in the hospital for continuation of hemodialysis as well as cardiology for syncopal episode. Underwent stress testing which was within normal limits patient has been treated with Levaquin for community acquired pneumonia. Has been progressive improvement during stay in hospital stable for discharge   DISCHARGE CONDITIONS:   stable/improved  CONSULTS OBTAINED:  Treatment Team:  Lamont Dowdy, MD Antonieta Iba, MD  DRUG ALLERGIES:  No Known Allergies  DISCHARGE MEDICATIONS:   Current Discharge Medication List    START taking these  medications   Details  levofloxacin (LEVAQUIN) 500 MG tablet Take 1 tablet (500 mg total) by mouth daily. Qty: 4 tablet, Refills: 0      CONTINUE these medications which have NOT CHANGED   Details  amLODipine (NORVASC) 10 MG tablet Take 10 mg by mouth daily.    aspirin EC 81 MG tablet Take 81 mg by mouth daily.    atorvastatin (LIPITOR) 80 MG tablet Take 80 mg by mouth at bedtime.    calcium acetate (PHOSLO) 667 MG capsule Take 167 mg by mouth daily.    carvedilol (COREG) 25 MG tablet Take 25 mg by mouth 2 (two) times daily with a meal.    clopidogrel (PLAVIX) 75 MG tablet Take 75 mg by mouth at bedtime.    doxazosin (CARDURA) 1 MG tablet Take 1 mg by mouth at bedtime.    finasteride (PROSCAR) 5 MG tablet Take 5 mg by mouth at bedtime.    FLUoxetine (PROZAC) 40 MG capsule Take 40 mg by mouth daily.    insulin NPH-regular Human (NOVOLIN 70/30) (70-30) 100 UNIT/ML injection Inject 20 Units into the skin 2 (two) times daily with a meal.    isosorbide mononitrate (IMDUR) 120 MG 24 hr tablet Take 120 mg by mouth daily.    levothyroxine (SYNTHROID, LEVOTHROID) 100 MCG tablet Take 100 mcg by mouth daily before breakfast.    linagliptin (TRADJENTA) 5 MG TABS tablet Take 5 mg by mouth at bedtime.    lisinopril (PRINIVIL,ZESTRIL) 40 MG tablet Take 40 mg by mouth at bedtime.  LORazepam (ATIVAN) 0.5 MG tablet Take 0.5 mg by mouth at bedtime.    nitroGLYCERIN (NITROSTAT) 0.4 MG SL tablet Take 0.4 mg by mouth every 5 (five) minutes as needed.    omeprazole (PRILOSEC) 40 MG capsule Take 40 mg by mouth daily.         DISCHARGE INSTRUCTIONS:    DIET:  Renal diet  DISCHARGE CONDITION:  Stable  ACTIVITY:  Activity as tolerated  OXYGEN:  Home Oxygen: No.   Oxygen Delivery: room air  DISCHARGE LOCATION:  home   If you experience worsening of your admission symptoms, develop shortness of breath, life threatening emergency, suicidal or homicidal thoughts you must seek  medical attention immediately by calling 911 or calling your MD immediately  if symptoms less severe.  You Must read complete instructions/literature along with all the possible adverse reactions/side effects for all the Medicines you take and that have been prescribed to you. Take any new Medicines after you have completely understood and accpet all the possible adverse reactions/side effects.   Please note  You were cared for by a hospitalist during your hospital stay. If you have any questions about your discharge medications or the care you received while you were in the hospital after you are discharged, you can call the unit and asked to speak with the hospitalist on call if the hospitalist that took care of you is not available. Once you are discharged, your primary care physician will handle any further medical issues. Please note that NO REFILLS for any discharge medications will be authorized once you are discharged, as it is imperative that you return to your primary care physician (or establish a relationship with a primary care physician if you do not have one) for your aftercare needs so that they can reassess your need for medications and monitor your lab values.    On the day of Discharge:   VITAL SIGNS:  Blood pressure 152/68, pulse 73, temperature 98 F (36.7 C), temperature source Oral, resp. rate 17, height 5\' 2"  (1.575 m), weight 70.2 kg (154 lb 12.2 oz), SpO2 100 %.  I/O:   Intake/Output Summary (Last 24 hours) at 10/23/15 1240 Last data filed at 10/23/15 0400  Gross per 24 hour  Intake   1080 ml  Output      0 ml  Net   1080 ml    PHYSICAL EXAMINATION:  GENERAL:  50 y.o.-year-old patient lying in the bed with no acute distress. Currently receiving dialysis EYES: Pupils equal, round, reactive to light and accommodation. No scleral icterus. Extraocular muscles intact.  HEENT: Head atraumatic, normocephalic. Oropharynx and nasopharynx clear.  NECK:  Supple, no  jugular venous distention. No thyroid enlargement, no tenderness.  LUNGS: Normal breath sounds bilaterally, no wheezing, rales,rhonchi or crepitation. No use of accessory muscles of respiration.  CARDIOVASCULAR: S1, S2 normal. No murmurs, rubs, or gallops.  ABDOMEN: Soft, non-tender, non-distended. Bowel sounds present. No organomegaly or mass.  EXTREMITIES: No pedal edema, cyanosis, or clubbing.  NEUROLOGIC: Cranial nerves II through XII are intact. Muscle strength 5/5 in all extremities. Sensation intact. Gait not checked.  PSYCHIATRIC: The patient is alert and oriented x 3.  SKIN: No obvious rash, lesion, or ulcer.   DATA REVIEW:   CBC  Recent Labs Lab 10/23/15 0620  WBC 9.7  HGB 9.4*  HCT 25.8*  PLT 146*    Chemistries   Recent Labs Lab 10/21/15 0654  10/23/15 0620  NA 116*  < > 130*  K 5.8*  < >  5.3*  CL 79*  < > 91*  CO2 21*  < > 25  GLUCOSE 626*  < > 165*  BUN 59*  < > 53*  CREATININE 9.79*  < > 8.47*  CALCIUM 8.3*  < > 8.3*  AST 16  --   --   ALT 13*  --   --   ALKPHOS 111  --   --   BILITOT 1.1  --   --   < > = values in this interval not displayed.  Cardiac Enzymes  Recent Labs Lab 10/21/15 0654  TROPONINI <0.03    Microbiology Results  Results for orders placed or performed during the hospital encounter of 10/21/15  MRSA PCR Screening     Status: None   Collection Time: 10/21/15  6:34 AM  Result Value Ref Range Status   MRSA by PCR NEGATIVE NEGATIVE Final    Comment:        The GeneXpert MRSA Assay (FDA approved for NASAL specimens only), is one component of a comprehensive MRSA colonization surveillance program. It is not intended to diagnose MRSA infection nor to guide or monitor treatment for MRSA infections.   Culture, blood (routine x 2)     Status: None (Preliminary result)   Collection Time: 10/21/15  8:08 AM  Result Value Ref Range Status   Specimen Description BLOOD RIGHT HAND  Final   Special Requests BOTTLES DRAWN AEROBIC  AND ANAEROBIC  0.5CC  Final   Culture NO GROWTH 2 DAYS  Final   Report Status PENDING  Incomplete  Culture, blood (routine x 2)     Status: None (Preliminary result)   Collection Time: 10/21/15  8:13 AM  Result Value Ref Range Status   Specimen Description BLOOD RIGHT WRIST  Final   Special Requests BOTTLES DRAWN AEROBIC AND ANAEROBIC  0.5CC  Final   Culture NO GROWTH 2 DAYS  Final   Report Status PENDING  Incomplete    RADIOLOGY:  Ct Head Wo Contrast  10/21/2015  CLINICAL DATA:  Syncope and collapse at dialysis. EXAM: CT HEAD WITHOUT CONTRAST TECHNIQUE: Contiguous axial images were obtained from the base of the skull through the vertex without intravenous contrast. COMPARISON:  None. FINDINGS: No evidence of parenchymal hemorrhage or extra-axial fluid collection. No mass lesion, mass effect, or midline shift. No CT evidence of acute infarction. Relatively symmetric normal variant globus pallidus calcifications. Intracranial atherosclerosis. Cerebral volume is age appropriate. No ventriculomegaly. Mild mucosal thickening throughout the ethmoidal air cells. No fluid levels in the visualized paranasal sinuses. The mastoid air cells are unopacified. No evidence of calvarial fracture. IMPRESSION: 1. No evidence of acute intracranial abnormality. No calvarial fracture. 2. Intracranial atherosclerosis. Electronically Signed   By: Delbert Phenix M.D.   On: 10/21/2015 18:52   US Carotid Bilateral  10/21/2015  CLINICAL DATA:  Syncope EXAM: BILATERAL CAROTID DUPLEX ULTRASOUND TECHNIQUE: Wallace Cullens scale imaging, color Doppler and duplex ultrasound were performed of bilateral carotid and vertebral arteries in the neck. COMPARISON:  None. FINDINGS: Criteria: Quantification of carotid stenosis is based on velocity parameters that correlate the residual internal carotid diameter with NASCET-based stenosis levels, using the diameter of the distal internal carotid lumen as the denominator for stenosis measurement. The  following velocity measurements were obtained: RIGHT ICA:  129 cm/sec CCA:  100 cm/sec SYSTOLIC ICA/CCA RATIO:  1.3 DIASTOLIC ICA/CCA RATIO:  1.5 ECA:  267 cm/sec LEFT ICA:  140 cm/sec CCA:  123 cm/sec SYSTOLIC ICA/CCA RATIO:  1.1 DIASTOLIC ICA/CCA RATIO:  1.8  ECA:  221 cm/sec RIGHT CAROTID ARTERY: Mild irregular plaque in the mid common carotid. Mild calcified plaque in the bulb. Low resistance internal carotid Doppler pattern. RIGHT VERTEBRAL ARTERY:  Antegrade.  Normal Doppler pattern. LEFT CAROTID ARTERY: Mild calcified plaque in the bulb. Low resistance internal carotid Doppler pattern. LEFT VERTEBRAL ARTERY:  Antegrade.  Normal Doppler pattern. IMPRESSION: Less than 50% stenosis in the right and left internal carotid arteries. Electronically Signed   By: Jolaine Click M.D.   On: 10/21/2015 21:48     Management plans discussed with the patient, family and they are in agreement.  CODE STATUS:     Code Status Orders        Start     Ordered   10/21/15 1028  Full code   Continuous     10/21/15 1027    Code Status History    Date Active Date Inactive Code Status Order ID Comments User Context   This patient has a current code status but no historical code status.      TOTAL TIME TAKING CARE OF THIS PATIENT: 28 minutes.    Hower,  Mardi Mainland.D on 10/23/2015 at 12:40 PM  Between 7am to 6pm - Pager - [phone]  After 6pm go to [email or web address] - password [email or web address] [facility]  [facility]  Office  [phone]  CC: Primary care physician; [facility]

## 2015-10-23 NOTE — Progress Notes (Signed)
Inpatient Diabetes Program Recommendations  AACE/ADA: New Consensus Statement on Inpatient Glycemic Control (2015)  Target Ranges:  Prepandial:   less than 140 mg/dL      Peak postprandial:   less than 180 mg/dL (1-2 hours)      Critically ill patients:  140 - 180 mg/dL   Review of Glycemic Control  Results for WILLEY, DUE (MRN [id number]) as of 10/23/2015 08:30  Ref. Range 10/22/2015 08:19 10/22/2015 11:52 10/22/2015 16:54 10/22/2015 21:08 10/23/2015 07:45  Glucose-Capillary Latest Ref Range: 65-99 mg/dL 409 (H) 811 (H) 914 (H) 135 (H) 151 (H)    Diabetes history: Uncontrolled diabetes  Outpatient Diabetes medications: clarified with patient- Novolin NPH 30 units bid, Tradjenta  /day Current orders for Inpatient glycemic control: Novolog mix 70/30 10 units bid, Tradjenta /day, Novolog 0-15 units tid, Novolog 0-5 units qhs  Through the hospital interpreter, patient tells me he is taking NPH insulin 30 units bid- he tells me the doctor told him to take it 3 times per day but he states he feels "bad" when he does.  He reports checking blood sugars 2x/day- before meals blood sugar around /dl, 2 hours after meals, blood sugars are 115-150mg /dl.  He does report that he sometimes checks his blood sugar at other times and it says "700".  He reports he never forgets his pills but sometimes he forgets his insulin.    Reviewed the A1C, importance of taking meds as ordered and the potential complications of elevated blood sugars- he tells the interpreter that diabetes is a "awful" disease.   I have clarified that the patient is being seen at the [facility], [address] in [place] [phone]) I spoke to the clinic and verified this.  Patient has on record that he is supposed to take 70/30 insulin but the patient has never filled it since in was written in the spring of 2016- I am awaiting a call back from the MD at the clinic to verify his home medications.  For  now, I agree with diabetes orders written for inpatient care.  Patient is NPO this am- 70/30 held and correction insulin given as ordered.   [name], RN, BA, MHA, CDE Diabetes Coordinator Inpatient Diabetes Program  [phone] (Team Pager) [phone] [facility] Office) 10/23/2015 8:44 AM

## 2015-10-23 NOTE — Progress Notes (Signed)
PT Cancellation Note  Patient Details Name: Lowell Makara MRN: [id number] DOB: 26-Jan-1966   Cancelled Treatment:    Reason Eval/Treat Not Completed: Patient at procedure or test/unavailable (Consult received.  Patient currently off unit for dialysis; will re-attempt at later time/date as patient available and medically appropriate.)  Dejane Scheibe H. Manson Passey, PT, DPT, NCS 10/23/2015, 11:38 AM [phone]

## 2015-10-23 NOTE — Care Management (Signed)
Stress test now. Dialysis today. I have requested dialysis chair as outpatient for patient since this is his dialysis day- through [facility] dialysis liaison.

## 2015-10-23 NOTE — Progress Notes (Signed)
Hospital Problem List     Active Problems:   Hypoxia   CAD in native artery   Chronic diastolic heart failure (HCC)   Syncope   ESRD (end stage renal disease) on dialysis (HCC)   CAP (community acquired pneumonia)   Aspiration pneumonia of right lung (HCC)   Hyperglycemia   Syncope and collapse   Patient Profile:   Primary Cardiologist: Dr. Graciela Husbands [facility])  50 y.o. male with past medical history of CAD (s/p multiple PCI to LCx and OM1), chronic diastolic CHF, Type 2 DM, HTN, HLD, ESRD (on HD T/Th/Sat), and questionable ASD who presented to [facility] on 10/21/2015 for increasing weakness and shortness of breath. Cardiology asked to see for syncope.  Subjective   Denies any chest pain, palpitations, or shortness of breath overnight. Was amble to ambulate with nursing staff down the hallway yesterday with no repeat episodes of leg weakness or syncope.  Inpatient Medications    . amLODipine  10 mg Oral Daily  . aspirin EC  81 mg Oral Daily  . atorvastatin  80 mg Oral QHS  . calcium acetate  667 mg Oral Q breakfast  . carvedilol  25 mg Oral BID WC  . clopidogrel  75 mg Oral QHS  . doxazosin  1 mg Oral QHS  . finasteride  5 mg Oral QHS  . FLUoxetine  40 mg Oral Daily  . heparin  5,000 Units Subcutaneous 3 times per day  . insulin aspart  0-15 Units Subcutaneous TID WC  . insulin aspart  0-5 Units Subcutaneous QHS  . insulin aspart protamine- aspart  10 Units Subcutaneous BID WC  . isosorbide mononitrate  120 mg Oral Daily  . levofloxacin  500 mg Oral Q48H  . levothyroxine  100 mcg Oral QAC breakfast  . linagliptin  5 mg Oral QHS  . lisinopril  40 mg Oral QHS  . LORazepam  0.5 mg Oral QHS  . pantoprazole  40 mg Oral Daily  . sodium chloride flush  3 mL Intravenous Q12H    Vital Signs    Filed Vitals:   10/22/15 1922 10/23/15 0436 10/23/15 0744 10/23/15 0747  BP: 150/60 132/52 141/54   Pulse: 78 75 76 76  Temp: 98.6 F (37 C) 97.5 F (36.4 C)  97.7 F (36.5 C)  TempSrc:  Oral Oral  Oral  Resp: Height:      Weight:      SpO2: 94% 93% 100% 100%    Intake/Output Summary (Last 24 hours) at 10/23/15 1015 Last data filed at 10/23/15 0400  Gross per 24 hour  Intake   1080 ml  Output      0 ml  Net   1080 ml   Filed Weights   10/21/15 1300 10/21/15 1609 10/22/15 0535  Weight: 154 lb 6.4 oz (70.035 kg) 152 lb 1.9 oz (69 kg) 154 lb 14.4 oz (70.262 kg)    Physical Exam    General: Pleasant, middle-aged Hispanic male appearing in NAD. Psych: Normal affect. Neuro: Alert and oriented X 3. Moves all extremities spontaneously. HEENT: Normal Neck: Supple without bruits or JVD. Lungs: Resp regular and unlabored, CTA without wheezing or rales. Heart: RRR no s3, s4, or murmurs. Abdomen: Soft, non-tender, non-distended, BS + x 4.  Extremities: No clubbing, cyanosis or edema. DP/PT/Radials 2+ and equal bilaterally.  Labs    CBC  Recent Labs  10/22/15 0550 10/23/15 0620  WBC 9.7 9.7  HGB 9.3* 9.4*  HCT  25.7* 25.8*  MCV 94.1 92.8  PLT 153 146*   Basic Metabolic Panel  Recent Labs  10/21/15 1308 10/22/15 0553 10/23/15 0620  NA 126* 133* 130*  K 5.4* 4.9 5.3*  CL 88* 93* 91*  CO2 GLUCOSE 270* 251* 165*  BUN 66* 35* 53*  CREATININE 10.19* 6.73* 8.47*  CALCIUM 8.8* 8.3* 8.3*  PHOS 4.5 4.2  --    Liver Function Tests  Recent Labs  10/21/15 0654 10/21/15 1308 10/22/15 0553  AST 16  --   --   ALT 13*  --   --   ALKPHOS 111  --   --   BILITOT 1.1  --   --   PROT 8.0  --   --   ALBUMIN 4.2 3.6 3.3*   No results for input(s): LIPASE, AMYLASE in the last 72 hours. Cardiac Enzymes  Recent Labs  10/21/15 0654  TROPONINI <0.03   BNP Invalid input(s): POCBNP D-Dimer No results for input(s): DDIMER in the last 72 hours. Hemoglobin A1C  Recent Labs  10/22/15 0550  HGBA1C 9.8*    Telemetry    Not reviewed. Seen in Nuclear Medicine.  ECG    NSR, HR 80. No acute ST or T-wave changes.    Cardiac Studies and Radiology    Dg Chest 2 View: 10/21/2015  CLINICAL DATA:  History CHF with generalized weakness EXAM: CHEST  2 VIEW COMPARISON:  09/09/2015 FINDINGS: Cardiac shadow is stable. The lungs are well aerated bilaterally and demonstrate increased patchy infiltrate in the bases. Small bilateral pleural effusions are seen. No other focal abnormality is noted. IMPRESSION: New bibasilar patchy infiltrates. Electronically Signed   By: Alcide Clever M.D.   On: 10/21/2015 08:10   Ct Head Wo Contrast: 10/21/2015  CLINICAL DATA:  Syncope and collapse at dialysis. EXAM: CT HEAD WITHOUT CONTRAST TECHNIQUE: Contiguous axial images were obtained from the base of the skull through the vertex without intravenous contrast. COMPARISON:  None. FINDINGS: No evidence of parenchymal hemorrhage or extra-axial fluid collection. No mass lesion, mass effect, or midline shift. No CT evidence of acute infarction. Relatively symmetric normal variant globus pallidus calcifications. Intracranial atherosclerosis. Cerebral volume is age appropriate. No ventriculomegaly. Mild mucosal thickening throughout the ethmoidal air cells. No fluid levels in the visualized paranasal sinuses. The mastoid air cells are unopacified. No evidence of calvarial fracture. IMPRESSION: 1. No evidence of acute intracranial abnormality. No calvarial fracture. 2. Intracranial atherosclerosis. Electronically Signed   By: Delbert Phenix M.D.   On: 10/21/2015 18:52   US Carotid Bilateral: 10/21/2015  CLINICAL DATA:  Syncope EXAM: BILATERAL CAROTID DUPLEX ULTRASOUND TECHNIQUE: Wallace Cullens scale imaging, color Doppler and duplex ultrasound were performed of bilateral carotid and vertebral arteries in the neck. COMPARISON:  None. FINDINGS: Criteria: Quantification of carotid stenosis is based on velocity parameters that correlate the residual internal carotid diameter with NASCET-based stenosis levels, using the diameter of the distal internal carotid lumen as the  denominator for stenosis measurement. The following velocity measurements were obtained: RIGHT ICA:  129 cm/sec CCA:  100 cm/sec SYSTOLIC ICA/CCA RATIO:  1.3 DIASTOLIC ICA/CCA RATIO:  1.5 ECA:  267 cm/sec LEFT ICA:  140 cm/sec CCA:  123 cm/sec SYSTOLIC ICA/CCA RATIO:  1.1 DIASTOLIC ICA/CCA RATIO:  1.8 ECA:  221 cm/sec RIGHT CAROTID ARTERY: Mild irregular plaque in the mid common carotid. Mild calcified plaque in the bulb. Low resistance internal carotid Doppler pattern. RIGHT VERTEBRAL ARTERY:  Antegrade.  Normal Doppler pattern. LEFT CAROTID ARTERY: Mild  calcified plaque in the bulb. Low resistance internal carotid Doppler pattern. LEFT VERTEBRAL ARTERY:  Antegrade.  Normal Doppler pattern. IMPRESSION: Less than 50% stenosis in the right and left internal carotid arteries. Electronically Signed   By: Jolaine Click M.D.   On: 10/21/2015 21:48    Echocardiogram: 10/22/2015 Study Conclusions - Left ventricle: The cavity size was normal. There was mild concentric hypertrophy. Systolic function was normal. The estimated ejection fraction was in the range of 50% to 55%. Wall motion was normal; there were no regional wall motion abnormalities. Doppler parameters are consistent with abnormal left ventricular relaxation (grade 1 diastolic dysfunction). - Left atrium: The atrium was mildly dilated. - Right ventricle: Systolic function was normal. - Atrial septum: A septal defect cannot be excluded. - Pulmonary arteries: Systolic pressure was within the normal range.  Impressions: - Normal study.  Assessment & Plan    1. Syncope of Unknown Etiology/ Lower Extremity Weakness - reports having lower extremity weakness for the past 1.5 months with two episodes of syncope. Reports feeling weak, falling to the ground, then "blacking out". No prodrome. Symptoms have not occurred in the setting of his dialysis sessions. - no history of cardiac arrhythmias. - Carotid US showed < 50% stenosis  bilaterally. - Echo showed no abnormalities by review of Dr. Mariah Milling. Results from [facility] are pending. - could consider 30-day event monitor through his primary Cardiologist at the time of discharge, granted his symptoms sound atypical for a cardiac arrhythmia etiology.  2. History of CAD - s/p multiple PCI to LCx and OM1 with last cath in 07/2014 (POBA was utilized for his 70% in-stent restenosis in the LCx at that time) - denies any recent chest pain or dyspnea on exertion. - continue ASA, Plavix, Amlodipine, BB, Imdur, and ACE-I. - results of Lexiscan Myoview are pending.  3. Chronic diastolic CHF - echo on 08/18/2015 and showed an EF of 55 to 60% and Grade 2 DD.  - does not appear volume overloaded on physical examination - continue BB and ACE-I - Echo shows preserved EF of 50 - 55%.   4. 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 Type 2 DM - on SSI  - A1c elevated to 9.8 - per admitting team  6. ESRD - HD Tuesday, Thursday, Saturday - Nephrology following.  7. HTN - BP has been 132/52 - 157/88 while admitted. - on Amlodipine 10mg  daily, Coreg 25mg  BID, Cardura 1mg  daily, Imdur 120mg  daily, and Lisinopril 40mg  daily. - appreciate Nephrology recommendations.  8. HLD - continue statin therapy  9. CAP - WBC elevated to 16.0 on admission. Improved to 9.7 on 10/23/2015. - on Levaquin - per admitting team  Signed, Ellsworth Lennox , PA-C 10:15 AM 10/23/2015 Pager: [phone]

## 2015-10-23 NOTE — Progress Notes (Signed)
Inpatient Diabetes Program Recommendations  AACE/ADA: New Consensus Statement on Inpatient Glycemic Control (2015)  Target Ranges:  Prepandial:   less than 140 mg/dL      Peak postprandial:   less than 180 mg/dL (1-2 hours)      Critically ill patients:  140 - 180 mg/dL  Phone call from [facility]- patient is supposed to be taking Novolin 70/30 5 units qam, 10 units qpm. Spoke with RN Ana re: findings.  [name], RN, BA, MHA, CDE Diabetes Coordinator Inpatient Diabetes Program  [phone] (Team Pager) [phone] [facility] Office) 10/23/2015 3:32 PM

## 2015-10-23 NOTE — Progress Notes (Signed)
Diabetes coordinator notified me regarding the phone call she received from [facility]. MD Hower notified. See note from Raynelle Fanning, Diabetes Coordinator.

## 2015-10-23 NOTE — Progress Notes (Signed)
[facility]  ROUNDING NOTE   Subjective:   Seen and examined on hemodialysis. Tolerating treatment well. UF of 2 litres.  Reports no more chest pain, shortness of breath or dizziness.  myoview pending.   Objective:  Vital signs in last 24 hours:  Temp:  [97.5 F (36.4 C)-99.3 F (37.4 C)] 97.7 F (36.5 C) (02/16 0747) Pulse Rate:  [75-82] 76 (02/16 0747) Resp:  [18-19] 18 (02/16 0744) BP: (132-157)/(52-88) 141/54 mmHg (02/16 0744) SpO2:  [92 %-100 %] 100 % (02/16 0747)  Weight change:  Filed Weights   10/21/15 1300 10/21/15 1609 10/22/15 0535  Weight: 70.035 kg (154 lb 6.4 oz) 69 kg (152 lb 1.9 oz) 70.262 kg (154 lb 14.4 oz)    Intake/Output: I/O last 3 completed shifts: In: 1680 [P.O.:1680] Out: 0    Intake/Output this shift:     Physical Exam: General: NAD, laying in bed  Head: Normocephalic, atraumatic. Moist oral mucosal membranes  Eyes: Anicteric, PERRL  Neck: Supple, trachea midline  Lungs:  Bibasilar rales.   Heart: Regular rate and rhythm  Abdomen:  Soft, nontender,   Extremities: no peripheral edema.  Neurologic: Nonfocal, moving all four extremities  Skin: No lesions  Access: Left forearm AVF    Basic Metabolic Panel:  Recent Labs Lab 10/21/15 0654 10/21/15 1308 10/22/15 0553 10/23/15 0620  NA 116* 126* 133* 130*  K 5.8* 5.4* 4.9 5.3*  CL 79* 88* 93* 91*  CO2 21* GLUCOSE 626* 270* 251* 165*  BUN 59* 66* 35* 53*  CREATININE 9.79* 10.19* 6.73* 8.47*  CALCIUM 8.3* 8.8* 8.3* 8.3*  PHOS  --  4.5 4.2  --     Liver Function Tests:  Recent Labs Lab 10/21/15 0654 10/21/15 1308 10/22/15 0553  AST 16  --   --   ALT 13*  --   --   ALKPHOS 111  --   --   BILITOT 1.1  --   --   PROT 8.0  --   --   ALBUMIN 4.2 3.6 3.3*   No results for input(s): LIPASE, AMYLASE in the last 168 hours. No results for input(s): AMMONIA in the last 168 hours.  CBC:  Recent Labs Lab 10/21/15 0654 10/21/15 1308 10/22/15 0550  10/23/15 0620  WBC 16.0* 14.9* 9.7 9.7  HGB 10.5* 9.2* 9.3* 9.4*  HCT 30.0* 26.5* 25.7* 25.8*  MCV 93.4 92.0 94.1 92.8  PLT 176 172 153 146*    Cardiac Enzymes:  Recent Labs Lab 10/21/15 0654  TROPONINI <0.03    BNP: Invalid input(s): POCBNP  CBG:  Recent Labs Lab 10/22/15 0819 10/22/15 1152 10/22/15 1654 10/22/15 2108 10/23/15 0745  GLUCAP 273* 296* 211* 135* 151*    Microbiology: Results for orders placed or performed during the hospital encounter of 10/21/15  MRSA PCR Screening     Status: None   Collection Time: 10/21/15  6:34 AM  Result Value Ref Range Status   MRSA by PCR NEGATIVE NEGATIVE Final    Comment:        The GeneXpert MRSA Assay (FDA approved for NASAL specimens only), is one component of a comprehensive MRSA colonization surveillance program. It is not intended to diagnose MRSA infection nor to guide or monitor treatment for MRSA infections.   Culture, blood (routine x 2)     Status: None (Preliminary result)   Collection Time: 10/21/15  8:08 AM  Result Value Ref Range Status   Specimen Description BLOOD RIGHT HAND  Final  Special Requests BOTTLES DRAWN AEROBIC AND ANAEROBIC  0.5CC  Final   Culture NO GROWTH 2 DAYS  Final   Report Status PENDING  Incomplete  Culture, blood (routine x 2)     Status: None (Preliminary result)   Collection Time: 10/21/15  8:13 AM  Result Value Ref Range Status   Specimen Description BLOOD RIGHT WRIST  Final   Special Requests BOTTLES DRAWN AEROBIC AND ANAEROBIC  0.5CC  Final   Culture NO GROWTH 2 DAYS  Final   Report Status PENDING  Incomplete    Coagulation Studies: No results for input(s): LABPROT, INR in the last 72 hours.  Urinalysis: No results for input(s): COLORURINE, LABSPEC, PHURINE, GLUCOSEU, HGBUR, BILIRUBINUR, KETONESUR, PROTEINUR, UROBILINOGEN, NITRITE, LEUKOCYTESUR in the last 72 hours.  Invalid input(s): APPERANCEUR    Imaging: Ct Head Wo Contrast  10/21/2015  CLINICAL DATA:   Syncope and collapse at dialysis. EXAM: CT HEAD WITHOUT CONTRAST TECHNIQUE: Contiguous axial images were obtained from the base of the skull through the vertex without intravenous contrast. COMPARISON:  None. FINDINGS: No evidence of parenchymal hemorrhage or extra-axial fluid collection. No mass lesion, mass effect, or midline shift. No CT evidence of acute infarction. Relatively symmetric normal variant globus pallidus calcifications. Intracranial atherosclerosis. Cerebral volume is age appropriate. No ventriculomegaly. Mild mucosal thickening throughout the ethmoidal air cells. No fluid levels in the visualized paranasal sinuses. The mastoid air cells are unopacified. No evidence of calvarial fracture. IMPRESSION: 1. No evidence of acute intracranial abnormality. No calvarial fracture. 2. Intracranial atherosclerosis. Electronically Signed   By: Delbert Phenix M.D.   On: 10/21/2015 18:52   US Carotid Bilateral  10/21/2015  CLINICAL DATA:  Syncope EXAM: BILATERAL CAROTID DUPLEX ULTRASOUND TECHNIQUE: Wallace Cullens scale imaging, color Doppler and duplex ultrasound were performed of bilateral carotid and vertebral arteries in the neck. COMPARISON:  None. FINDINGS: Criteria: Quantification of carotid stenosis is based on velocity parameters that correlate the residual internal carotid diameter with NASCET-based stenosis levels, using the diameter of the distal internal carotid lumen as the denominator for stenosis measurement. The following velocity measurements were obtained: RIGHT ICA:  129 cm/sec CCA:  100 cm/sec SYSTOLIC ICA/CCA RATIO:  1.3 DIASTOLIC ICA/CCA RATIO:  1.5 ECA:  267 cm/sec LEFT ICA:  140 cm/sec CCA:  123 cm/sec SYSTOLIC ICA/CCA RATIO:  1.1 DIASTOLIC ICA/CCA RATIO:  1.8 ECA:  221 cm/sec RIGHT CAROTID ARTERY: Mild irregular plaque in the mid common carotid. Mild calcified plaque in the bulb. Low resistance internal carotid Doppler pattern. RIGHT VERTEBRAL ARTERY:  Antegrade.  Normal Doppler pattern. LEFT  CAROTID ARTERY: Mild calcified plaque in the bulb. Low resistance internal carotid Doppler pattern. LEFT VERTEBRAL ARTERY:  Antegrade.  Normal Doppler pattern. IMPRESSION: Less than 50% stenosis in the right and left internal carotid arteries. Electronically Signed   By: Jolaine Click M.D.   On: 10/21/2015 21:48     Medications:     . amLODipine  10 mg Oral Daily  . aspirin EC  81 mg Oral Daily  . atorvastatin  80 mg Oral QHS  . calcium acetate  667 mg Oral Q breakfast  . carvedilol  25 mg Oral BID WC  . clopidogrel  75 mg Oral QHS  . doxazosin  1 mg Oral QHS  . finasteride  5 mg Oral QHS  . FLUoxetine  40 mg Oral Daily  . heparin  5,000 Units Subcutaneous 3 times per day  . insulin aspart  0-15 Units Subcutaneous TID WC  . insulin aspart  0-5  Units Subcutaneous QHS  . insulin aspart protamine- aspart  10 Units Subcutaneous BID WC  . isosorbide mononitrate  120 mg Oral Daily  . levofloxacin  500 mg Oral Q48H  . levothyroxine  100 mcg Oral QAC breakfast  . linagliptin  5 mg Oral QHS  . lisinopril  40 mg Oral QHS  . LORazepam  0.5 mg Oral QHS  . pantoprazole  40 mg Oral Daily  . sodium chloride flush  3 mL Intravenous Q12H   acetaminophen **OR** acetaminophen, alum & mag hydroxide-simeth, hydrALAZINE, HYDROcodone-acetaminophen, ondansetron **OR** ondansetron (ZOFRAN) IV, senna-docusate  Assessment/ Plan:  Mr. Marcus [name] is a 50 y.o.  Hispanic male with uncontrolled diabetes mellitus type II, hypertension, hyperlipidemia, hypothyroidism, BPH, coronary artery disease status post stent.   [facility] TTS  1. End stage renal disease: seen and examined on hemodialysis. tolerating dialysis treatment.  Continue TTS schedule  2. Hypertension: well controlled.  - amlodipine, carvedilol, doxazosin, imdur, lisinopril.   3. Diabetes mellitus type II with chronic kidney disease: not well controlled with hemoglobin A1c of 9.8%.   4. Anemia of chronic kidney disease:  hemoglobin 9.2 - micera as outpatient. Held epo due to concern of ischemic event.   5. Secondary Hyperparathyroidism: Calcium at goal. Phosphorus at goal. PTH at goal.  - Continue calcium acetate with meals.   6. Pneumonia: elevated white count. Afebrile.  - levofloxacin.   7. Diabetes Mellitus type II with chronic kidney disease: not well controlled. Hemoglobin A1c of 9.8%   LOS: 2 Jillana Selph 2/16/201711:27 AM

## 2015-10-24 NOTE — Progress Notes (Signed)
DISCHARGE NOTE:  Spanish Interpreter at the bedside who is translating discharge instructions. Pt verbalized understanding. Prescriptions handed to pts wife who was at bedside, (see d/c orders). Pt wheeled to car by staff.

## 2015-10-26 LAB — CULTURE, BLOOD (ROUTINE X 2)
CULTURE: NO GROWTH
Culture: NO GROWTH

## 2015-11-29 ENCOUNTER — Emergency Department
Admission: EM | Admit: 2015-11-29 | Discharge: 2015-11-29 | Disposition: A | Discharge status: Home / Self Care | Payer: Medicaid Other | Attending: Emergency Medicine | Admitting: Emergency Medicine

## 2015-11-29 ENCOUNTER — Emergency Department: Payer: Medicaid Other

## 2015-11-29 ENCOUNTER — Encounter: Payer: Self-pay | Admitting: Emergency Medicine

## 2015-11-29 DIAGNOSIS — S0011XA Contusion of right eyelid and periocular area, initial encounter: Secondary | ICD-10-CM | POA: Diagnosis not present

## 2015-11-29 DIAGNOSIS — Z792 Long term (current) use of antibiotics: Secondary | ICD-10-CM | POA: Insufficient documentation

## 2015-11-29 DIAGNOSIS — N186 End stage renal disease: Secondary | ICD-10-CM | POA: Diagnosis not present

## 2015-11-29 DIAGNOSIS — W01198A Fall on same level from slipping, tripping and stumbling with subsequent striking against other object, initial encounter: Secondary | ICD-10-CM | POA: Diagnosis not present

## 2015-11-29 DIAGNOSIS — Y9289 Other specified places as the place of occurrence of the external cause: Secondary | ICD-10-CM | POA: Diagnosis not present

## 2015-11-29 DIAGNOSIS — Y998 Other external cause status: Secondary | ICD-10-CM | POA: Diagnosis not present

## 2015-11-29 DIAGNOSIS — M898X5 Other specified disorders of bone, thigh: Secondary | ICD-10-CM

## 2015-11-29 DIAGNOSIS — Z79899 Other long term (current) drug therapy: Secondary | ICD-10-CM | POA: Insufficient documentation

## 2015-11-29 DIAGNOSIS — R937 Abnormal findings on diagnostic imaging of other parts of musculoskeletal system: Secondary | ICD-10-CM | POA: Diagnosis not present

## 2015-11-29 DIAGNOSIS — Z7902 Long term (current) use of antithrombotics/antiplatelets: Secondary | ICD-10-CM | POA: Diagnosis not present

## 2015-11-29 DIAGNOSIS — S79922A Unspecified injury of left thigh, initial encounter: Secondary | ICD-10-CM | POA: Diagnosis not present

## 2015-11-29 DIAGNOSIS — Z7982 Long term (current) use of aspirin: Secondary | ICD-10-CM | POA: Insufficient documentation

## 2015-11-29 DIAGNOSIS — S8992XA Unspecified injury of left lower leg, initial encounter: Secondary | ICD-10-CM | POA: Diagnosis present

## 2015-11-29 DIAGNOSIS — I120 Hypertensive chronic kidney disease with stage 5 chronic kidney disease or end stage renal disease: Secondary | ICD-10-CM | POA: Diagnosis not present

## 2015-11-29 DIAGNOSIS — Y9389 Activity, other specified: Secondary | ICD-10-CM | POA: Insufficient documentation

## 2015-11-29 DIAGNOSIS — Z992 Dependence on renal dialysis: Secondary | ICD-10-CM | POA: Insufficient documentation

## 2015-11-29 DIAGNOSIS — R9389 Abnormal findings on diagnostic imaging of other specified body structures: Secondary | ICD-10-CM

## 2015-11-29 DIAGNOSIS — Z794 Long term (current) use of insulin: Secondary | ICD-10-CM | POA: Diagnosis not present

## 2015-11-29 DIAGNOSIS — E119 Type 2 diabetes mellitus without complications: Secondary | ICD-10-CM | POA: Insufficient documentation

## 2015-11-29 MED ORDER — OXYCODONE-ACETAMINOPHEN 5-325 MG PO TABS: 1.0000 | ORAL_TABLET | Freq: Four times a day (QID) | ORAL | Status: DC | PRN

## 2015-11-29 MED ORDER — HYDROMORPHONE HCL 1 MG/ML IJ SOLN
0.5000 mg | Freq: Once | INTRAMUSCULAR | Status: AC
  Administered 2015-11-29: 0.5 mg via INTRAVENOUS
  Filled 2015-11-29: qty 1

## 2015-11-29 NOTE — ED Provider Notes (Signed)
[facility] Emergency Department Provider Note  ____________________________________________  Time seen: Approximately 11:14 AM  I have reviewed the triage vital signs and the nursing notes.   HISTORY  Chief Complaint Weakness    HPI Marcus [name] is a 50 y.o. male with ESRD on hemodialysis, HTN, DM, CHF presenting with left leg pain. Patient reports that for the past 2-3 weeks he has been having a severe pain from the left hip to just below the left knee. He has also had some mild associated swelling. No calf pain. No numbness tingling or weakness. No known trauma. No fevers, chills, erythema, nausea or vomiting. Yesterday, the pain was severe and while he was walking he had so much pain that he fell down. He did not lose consciousness but he did strike his face and developed a black eye. He had no preceding chest pain, palpitations or shortness of breath. Today, he was at dialysis and after 3-1/2 hours pain became so severe that dialysis was discontinued and he was brought to the emergency department. Not tried any medications for pain. He has no associated low back pain, urinary or fecal incontinence or retention.   Past Medical History  Diagnosis Date  . Dialysis patient [facility])     a. Tue/Th/Sat Schedule  . Hypertension   . Diabetes mellitus without complication (HCC)   . Thyroid disease   . Chronic diastolic CHF (congestive heart failure) (HCC)     a. EF 55-60% by echo in 08/2015; Grade 2 DD noted.  . Depression   . BPH (benign prostatic hyperplasia)   . High cholesterol   . CAD (coronary artery disease) of artery bypass graft     a. s/p multipe PCI to LCx and OMI; Last cath in 07/2014 w/ POBA to 70% stenosis in LCx     Patient Active Problem List   Diagnosis Date Noted  . CAD in native artery 10/22/2015  . Chronic diastolic heart failure (HCC) 10/22/2015  . Syncope 10/22/2015  . ESRD (end stage renal disease) on dialysis (HCC) 10/22/2015  . CAP  (community acquired pneumonia) 10/22/2015  . Aspiration pneumonia of right lung (HCC)   . Hyperglycemia   . Syncope and collapse   . Hypoxia 10/21/2015    No past surgical history on file.  Current Outpatient Rx  Name  Route  Sig  Dispense  Refill  . amLODipine (NORVASC) 10 MG tablet   Oral   Take 10 mg by mouth daily.         Marland Kitchen aspirin EC 81 MG tablet   Oral   Take 81 mg by mouth daily.         Marland Kitchen atorvastatin (LIPITOR) 80 MG tablet   Oral   Take 80 mg by mouth at bedtime.         . calcium acetate (PHOSLO) 667 MG capsule   Oral   Take 167 mg by mouth daily.         . carvedilol (COREG) 25 MG tablet   Oral   Take 25 mg by mouth 2 (two) times daily with a meal.         . clopidogrel (PLAVIX) 75 MG tablet   Oral   Take 75 mg by mouth at bedtime.         Marland Kitchen doxazosin (CARDURA) 1 MG tablet   Oral   Take 1 mg by mouth at bedtime.         . finasteride (PROSCAR) 5 MG tablet  Oral   Take 5 mg by mouth at bedtime.         Marland Kitchen FLUoxetine (PROZAC) 40 MG capsule   Oral   Take 40 mg by mouth daily.         . insulin aspart protamine- aspart (NOVOLOG MIX 70/30) (70-30) 100 UNIT/ML injection   Subcutaneous   Inject 0.05 mLs (5 Units total) into the skin daily with breakfast.   10 mL   11   . insulin aspart protamine- aspart (NOVOLOG MIX 70/30) (70-30) 100 UNIT/ML injection   Subcutaneous   Inject 0.1 mLs (10 Units total) into the skin daily with supper.   10 mL   11   . insulin NPH Human (NOVOLIN N) 100 UNIT/ML injection   Subcutaneous   Inject 0.1 mLs (10 Units total) into the skin at bedtime.   10 mL   11   . insulin NPH-regular Human (NOVOLIN 70/30) (70-30) 100 UNIT/ML injection   Subcutaneous   Inject 5 Units into the skin daily with breakfast.   10 mL   11   . isosorbide mononitrate (IMDUR) 120 MG 24 hr tablet   Oral   Take 120 mg by mouth daily.         Marland Kitchen levofloxacin (LEVAQUIN) 500 MG tablet   Oral   Take 1 tablet (500 mg total)  by mouth daily.   4 tablet   0   . levothyroxine (SYNTHROID, LEVOTHROID) 100 MCG tablet   Oral   Take 100 mcg by mouth daily before breakfast.         . linagliptin (TRADJENTA) 5 MG TABS tablet   Oral   Take 5 mg by mouth at bedtime.         Marland Kitchen lisinopril (PRINIVIL,ZESTRIL) 40 MG tablet   Oral   Take 40 mg by mouth at bedtime.         Marland Kitchen LORazepam (ATIVAN) 0.5 MG tablet   Oral   Take 0.5 mg by mouth at bedtime.         . nitroGLYCERIN (NITROSTAT) 0.4 MG SL tablet   Oral   Take 0.4 mg by mouth every 5 (five) minutes as needed.         Marland Kitchen omeprazole (PRILOSEC) 40 MG capsule   Oral   Take 40 mg by mouth daily.           Allergies Review of patient's allergies indicates no known allergies.  No family history on file.  Social History Social History  Substance Use Topics  . Smoking status: Current Every Day Smoker  . Smokeless tobacco: None  . Alcohol Use: None     Review of Systems Constitutional: No fever/chills. Positive fall. Negative lightheadedness or syncope. No loss of consciousness. Eyes: No visual changes. No blurred or double vision. Positive injury to the right orbit. ENT: No sore throat. Cardiovascular: Denies chest pain, palpitations. Respiratory: Denies shortness of breath.  No cough. Gastrointestinal: No abdominal pain.  No nausea, no vomiting.  No diarrhea.  No constipation. Genitourinary: Negative for dysuria. Musculoskeletal: Negative for back pain. Positive left lower extremity pain. Skin: Negative for rash. Neurological: Negative for headaches, focal weakness or numbness.  10-point ROS otherwise negative.  ____________________________________________   PHYSICAL EXAM:  VITAL SIGNS: ED Triage Vitals  Enc Vitals Group     BP 11/29/15 1042 141/63 mmHg     Pulse Rate 11/29/15 1042 75     Resp 11/29/15 1042 17     Temp --  Temp src --      SpO2 11/29/15 1028 98 %     Weight --      Height --      Head Cir --      Peak  Flow --      Pain Score 11/29/15 1044 0     Pain Loc --      Pain Edu? --      Excl. in GC? --     Constitutional: Patient is alert and oriented and answering questions properly. He is chronically ill appearing but nontoxic  Eyes: Old right ecchymosis around the orbit. EOMI and PERRLA. No pain with eye movements. Head: See exam. No Battle sign. Nose: No congestion/rhinnorhea. Mouth/Throat: Mucous membranes are moist. No malocclusion or dental injury. Positive poor dentition. Neck: No stridor.  Supple.  No midline C-spine tenderness to palpation, step-offs or deformities. Full range of motion without pain. Cardiovascular: Normal rate, regular rhythm. No murmurs, rubs or gallops.  Respiratory: Normal respiratory effort.  No retractions. Lungs CTAB.  No wheezes, rales or ronchi. Gastrointestinal: Soft and nontender. No distention. No peritoneal signs. Musculoskeletal: Left upper extremity has an AV graft which has a pulse. Full range of motion of bilateral ankles, right knee, right hip without pain. There is some minimal swelling in the distal thigh with associated tenderness to palpation but no erythema or warmth. Patient has full range of motion of the left knee and the left hip but some discomfort with this. No tenderness to palpation over the left greater trochanter. Pelvis is stable. Normal DP and PT pulses bilaterally. Normal sensation to light touch throughout the bilateral lower extremities. Neurologic:  Normal speech and language. No gross focal neurologic deficits are appreciated.  Skin:  Skin is warm, dry and intact. No rash noted. Psychiatric: Mood and affect are normal. Speech and behavior are normal.  Normal judgement. ____________________________________________   LABS (all labs ordered are listed, but only abnormal results are displayed)  Labs Reviewed - No data to display ____________________________________________  EKG  Not  indicated ____________________________________________  RADIOLOGY  No results found.  ____________________________________________   PROCEDURES  Procedure(s) performed: None  Critical Care performed: No ____________________________________________   INITIAL IMPRESSION / ASSESSMENT AND PLAN / ED COURSE  Pertinent labs & imaging results that were available during my care of the patient were reviewed by me and considered in my medical decision making (see chart for details).  50 y.o. male with ESRD on hemodialysis presenting for several weeks of left thigh pain. He has some associated mild swelling. There is no evidence of acute infection, he has not been having fever or systemic symptoms. I do not think he has septic arthritis. I will evaluate for any bony abnormalities, as well as rule out DVT. In the meantime, I'll also initiate symptomatically treatment. Plan reevaluation after diagnostic treatment is complete.  ----------------------------------------- 1:08 PM on 11/29/2015 -----------------------------------------  The patient's workup is largely reassuring. His CT face does not show any acute facial fractures and his CT head does not show any acute intracranial abnormalities. His x-rays are all negative for acute bony injury. The ultrasound does not show DVT. There is a heterogeneous abnormality that may be consistent with contusion versus myositis Ersa's infection. Clinically, the patient is not having any fever or systemic illness and does not have any evidence of infection on exam. I'll plan to have the patient treat his leg pain with elevation, ice, and Tylenol for pain.  The patient's wife has arrived and has  been able to give me a better history. He has been evaluated by his primary care physician and given 2 different creams for arthritis, as well as Percocet. She reports that his pain is no better with Percocet and that he just is sleepy. I'm concerned that this may  increase his fall risk, so I have asked her to discontinue this medication in favor of taking just Tylenol. He can also stop the creams.  Plan discharge. Return precautions as well as follow-up instructions were discussed. ____________________________________________  FINAL CLINICAL IMPRESSION(S) / ED DIAGNOSES  Final diagnoses:  Pain of left femur      NEW MEDICATIONS STARTED DURING THIS VISIT:  New Prescriptions   No medications on file     Rockne Menghini, MD 11/29/15 1310

## 2015-11-29 NOTE — ED Notes (Addendum)
Pt arrived by EMS from [facility]. During dialysis pt complained of left side cramping. Pt was taken to bathroom and had unsteady gait per staff. Pt states he has had 2 falls. One fall was yesterday and pt has hematoma over right eye.

## 2015-11-29 NOTE — ED Notes (Signed)
Interpretor at bedside. Pt verbalized understanding of discharge instructions. NAD at this time.

## 2015-11-29 NOTE — ED Notes (Signed)
Patient transported to X-ray 

## 2015-11-29 NOTE — ED Notes (Signed)
Interpretor Trudy at bedside during triage and assessment.

## 2015-11-29 NOTE — Discharge Instructions (Signed)
Por favor, deje de tomar las cremas y la medicina que se llama Oxycodone/Acetominopen (Percocet) porque no esta ayudando.  Puede poner hielo en la pierna izquierda por 10 minutes cada 3 horas.  Tambien puede tomar Tylenol para dolor (1000mg  cada 8 horas).  Por favor haga una cita con su doctor regular y con el ortopedista (Dr. Hyacinth MeekerMiller, doctor de los huesos y los musculos).  SIEMPRE SIEMPRE SIEMPRE utilize el walker cuando esta caminando para prevenir que se cae.  Vuelve a la sala de emergencia si tiene mas dolor, fiebre, nausea o vomito, o cualquier otro simptoma.

## 2016-04-02 ENCOUNTER — Encounter: Payer: Self-pay | Admitting: Emergency Medicine

## 2016-04-02 ENCOUNTER — Inpatient Hospital Stay
Admission: EM | Admit: 2016-04-02 | Discharge: 2016-04-03 | DRG: 640 | Disposition: A | Discharge status: Home / Self Care | Payer: Medicaid Other | Attending: Internal Medicine | Admitting: Internal Medicine

## 2016-04-02 DIAGNOSIS — I132 Hypertensive heart and chronic kidney disease with heart failure and with stage 5 chronic kidney disease, or end stage renal disease: Secondary | ICD-10-CM | POA: Diagnosis present

## 2016-04-02 DIAGNOSIS — Z955 Presence of coronary angioplasty implant and graft: Secondary | ICD-10-CM | POA: Diagnosis not present

## 2016-04-02 DIAGNOSIS — Z992 Dependence on renal dialysis: Secondary | ICD-10-CM | POA: Diagnosis not present

## 2016-04-02 DIAGNOSIS — Z794 Long term (current) use of insulin: Secondary | ICD-10-CM | POA: Diagnosis not present

## 2016-04-02 DIAGNOSIS — E1165 Type 2 diabetes mellitus with hyperglycemia: Secondary | ICD-10-CM | POA: Diagnosis present

## 2016-04-02 DIAGNOSIS — D631 Anemia in chronic kidney disease: Secondary | ICD-10-CM | POA: Diagnosis present

## 2016-04-02 DIAGNOSIS — E1140 Type 2 diabetes mellitus with diabetic neuropathy, unspecified: Secondary | ICD-10-CM | POA: Diagnosis present

## 2016-04-02 DIAGNOSIS — E875 Hyperkalemia: Secondary | ICD-10-CM | POA: Diagnosis present

## 2016-04-02 DIAGNOSIS — F17200 Nicotine dependence, unspecified, uncomplicated: Secondary | ICD-10-CM | POA: Diagnosis present

## 2016-04-02 DIAGNOSIS — Z79899 Other long term (current) drug therapy: Secondary | ICD-10-CM

## 2016-04-02 DIAGNOSIS — I5032 Chronic diastolic (congestive) heart failure: Secondary | ICD-10-CM | POA: Diagnosis present

## 2016-04-02 DIAGNOSIS — E039 Hypothyroidism, unspecified: Secondary | ICD-10-CM | POA: Diagnosis present

## 2016-04-02 DIAGNOSIS — N2581 Secondary hyperparathyroidism of renal origin: Secondary | ICD-10-CM | POA: Diagnosis present

## 2016-04-02 DIAGNOSIS — M79606 Pain in leg, unspecified: Secondary | ICD-10-CM

## 2016-04-02 DIAGNOSIS — E1122 Type 2 diabetes mellitus with diabetic chronic kidney disease: Secondary | ICD-10-CM | POA: Diagnosis present

## 2016-04-02 DIAGNOSIS — I2510 Atherosclerotic heart disease of native coronary artery without angina pectoris: Secondary | ICD-10-CM | POA: Diagnosis present

## 2016-04-02 DIAGNOSIS — Z9889 Other specified postprocedural states: Secondary | ICD-10-CM | POA: Diagnosis not present

## 2016-04-02 DIAGNOSIS — R531 Weakness: Secondary | ICD-10-CM

## 2016-04-02 DIAGNOSIS — N400 Enlarged prostate without lower urinary tract symptoms: Secondary | ICD-10-CM | POA: Diagnosis present

## 2016-04-02 DIAGNOSIS — Z7982 Long term (current) use of aspirin: Secondary | ICD-10-CM | POA: Diagnosis not present

## 2016-04-02 DIAGNOSIS — N186 End stage renal disease: Secondary | ICD-10-CM | POA: Diagnosis present

## 2016-04-02 LAB — COMPREHENSIVE METABOLIC PANEL
ALK PHOS: 91 U/L (ref 38–126)
ALK PHOS: 102 U/L (ref 38–126)
ALT: 12 U/L — AB (ref 17–63)
ALT: 11 U/L — AB (ref 17–63)
ANION GAP: 12 (ref 5–15)
AST: 22 U/L (ref 15–41)
AST: 21 U/L (ref 15–41)
Albumin: 4.2 g/dL (ref 3.5–5.0)
Albumin: 4.4 g/dL (ref 3.5–5.0)
Anion gap: 11 (ref 5–15)
BUN: 27 mg/dL — AB (ref 6–20)
BUN: 28 mg/dL — ABNORMAL HIGH (ref 6–20)
CALCIUM: 9.5 mg/dL (ref 8.9–10.3)
CALCIUM: 9.6 mg/dL (ref 8.9–10.3)
CHLORIDE: 95 mmol/L — AB (ref 101–111)
CHLORIDE: 95 mmol/L — AB (ref 101–111)
CO2: 26 mmol/L (ref 22–32)
CO2: 26 mmol/L (ref 22–32)
CREATININE: 5.42 mg/dL — AB (ref 0.61–1.24)
CREATININE: 5.66 mg/dL — AB (ref 0.61–1.24)
GFR, EST AFRICAN AMERICAN: 13 mL/min — AB (ref >60)
GFR, EST AFRICAN AMERICAN: 12 mL/min — AB (ref >60)
GFR, EST NON AFRICAN AMERICAN: 11 mL/min — AB (ref >60)
GFR, EST NON AFRICAN AMERICAN: 10 mL/min — AB (ref >60)
Glucose, Bld: 308 mg/dL — ABNORMAL HIGH (ref 65–99)
Glucose, Bld: 307 mg/dL — ABNORMAL HIGH (ref 65–99)
Potassium: 6.7 mmol/L (ref 3.5–5.1)
Potassium: 6.6 mmol/L (ref 3.5–5.1)
SODIUM: 133 mmol/L — AB (ref 135–145)
Sodium: 132 mmol/L — ABNORMAL LOW (ref 135–145)
TOTAL PROTEIN: 7.9 g/dL (ref 6.5–8.1)
Total Bilirubin: 0.7 mg/dL (ref 0.3–1.2)
Total Bilirubin: 1 mg/dL (ref 0.3–1.2)
Total Protein: 7.6 g/dL (ref 6.5–8.1)

## 2016-04-02 LAB — CBC WITH DIFFERENTIAL/PLATELET
BASOS ABS: 0.1 10*3/uL (ref 0–0.1)
Basophils Relative: 1 %
Eosinophils Absolute: 0.8 10*3/uL — ABNORMAL HIGH (ref 0–0.7)
Eosinophils Relative: 8 %
HEMATOCRIT: 39.2 % — AB (ref 40.0–52.0)
HEMOGLOBIN: 13.4 g/dL (ref 13.0–18.0)
LYMPHS ABS: 1.2 10*3/uL (ref 1.0–3.6)
LYMPHS PCT: 12 %
MCH: 32.9 pg (ref 26.0–34.0)
MCHC: 34.3 g/dL (ref 32.0–36.0)
MCV: 95.9 fL (ref 80.0–100.0)
Monocytes Absolute: 1 10*3/uL (ref 0.2–1.0)
Monocytes Relative: 10 %
NEUTROS ABS: 7.1 10*3/uL — AB (ref 1.4–6.5)
NEUTROS PCT: 69 %
PLATELETS: 173 10*3/uL (ref 150–440)
RBC: 4.09 MIL/uL — AB (ref 4.40–5.90)
RDW: 15.4 % — ABNORMAL HIGH (ref 11.5–14.5)
WBC: 10.2 10*3/uL (ref 3.8–10.6)

## 2016-04-02 LAB — BASIC METABOLIC PANEL
Anion gap: 9 (ref 5–15)
BUN: 9 mg/dL (ref 6–20)
CHLORIDE: 99 mmol/L — AB (ref 101–111)
CO2: 32 mmol/L (ref 22–32)
Calcium: 8.4 mg/dL — ABNORMAL LOW (ref 8.9–10.3)
Creatinine, Ser: 2.35 mg/dL — ABNORMAL HIGH (ref 0.61–1.24)
GFR calc Af Amer: 35 mL/min — ABNORMAL LOW (ref >60)
GFR calc non Af Amer: 30 mL/min — ABNORMAL LOW (ref >60)
GLUCOSE: 102 mg/dL — AB (ref 65–99)
POTASSIUM: 3.8 mmol/L (ref 3.5–5.1)
Sodium: 140 mmol/L (ref 135–145)

## 2016-04-02 LAB — GLUCOSE, CAPILLARY: Glucose-Capillary: 100 mg/dL — ABNORMAL HIGH (ref 65–99)

## 2016-04-02 LAB — CK: CK TOTAL: 248 U/L (ref 49–397)

## 2016-04-02 MED ORDER — CALCIUM ACETATE (PHOS BINDER) 667 MG PO CAPS
667.0000 mg | ORAL_CAPSULE | Freq: Three times a day (TID) | ORAL | Status: DC
  Administered 2016-04-03: 667 mg via ORAL
  Filled 2016-04-02 (×3): qty 1

## 2016-04-02 MED ORDER — INSULIN REGULAR HUMAN 100 UNIT/ML IJ SOLN
10.0000 [IU] | Freq: Once | INTRAMUSCULAR | Status: DC
  Filled 2016-04-02: qty 0.1

## 2016-04-02 MED ORDER — LEVOTHYROXINE SODIUM 100 MCG PO TABS: 100.0000 ug | ORAL_TABLET | Freq: Every day | ORAL | Status: DC

## 2016-04-02 MED ORDER — CARVEDILOL 25 MG PO TABS
25.0000 mg | ORAL_TABLET | Freq: Two times a day (BID) | ORAL | Status: DC
  Administered 2016-04-03: 25 mg via ORAL
  Filled 2016-04-02 (×2): qty 1

## 2016-04-02 MED ORDER — SODIUM CHLORIDE 0.9 % IV SOLN
1.0000 g | Freq: Once | INTRAVENOUS | Status: AC
  Administered 2016-04-02: 1 g via INTRAVENOUS
  Filled 2016-04-02: qty 10

## 2016-04-02 MED ORDER — ENOXAPARIN SODIUM 30 MG/0.3ML ~~LOC~~ SOLN
30.0000 mg | SUBCUTANEOUS | Status: DC
  Administered 2016-04-02: 30 mg via SUBCUTANEOUS
  Filled 2016-04-02: qty 0.3

## 2016-04-02 MED ORDER — SODIUM CHLORIDE 0.9 % IV SOLN: 250.0000 mL | INTRAVENOUS | Status: DC | PRN

## 2016-04-02 MED ORDER — ONDANSETRON HCL 4 MG PO TABS: 4.0000 mg | ORAL_TABLET | Freq: Four times a day (QID) | ORAL | Status: DC | PRN

## 2016-04-02 MED ORDER — ACETAMINOPHEN 325 MG PO TABS: 650.0000 mg | ORAL_TABLET | Freq: Four times a day (QID) | ORAL | Status: DC | PRN

## 2016-04-02 MED ORDER — OXYCODONE HCL 5 MG PO TABS
10.0000 mg | ORAL_TABLET | Freq: Once | ORAL | Status: AC
  Administered 2016-04-02: 10 mg via ORAL
  Filled 2016-04-02: qty 2

## 2016-04-02 MED ORDER — INSULIN DETEMIR 100 UNIT/ML ~~LOC~~ SOLN
10.0000 [IU] | Freq: Every day | SUBCUTANEOUS | Status: DC
  Administered 2016-04-02: 10 [IU] via SUBCUTANEOUS
  Filled 2016-04-02 (×2): qty 0.1

## 2016-04-02 MED ORDER — NITROGLYCERIN 0.4 MG SL SUBL: 0.4000 mg | SUBLINGUAL_TABLET | SUBLINGUAL | Status: DC | PRN

## 2016-04-02 MED ORDER — GABAPENTIN 100 MG PO CAPS
100.0000 mg | ORAL_CAPSULE | Freq: Three times a day (TID) | ORAL | Status: DC
  Administered 2016-04-02 – 2016-04-03 (×2): 100 mg via ORAL
  Filled 2016-04-02 (×2): qty 1

## 2016-04-02 MED ORDER — FLUOXETINE HCL 20 MG PO CAPS: 40.0000 mg | ORAL_CAPSULE | Freq: Every day | ORAL | Status: DC

## 2016-04-02 MED ORDER — LEVETIRACETAM 500 MG PO TABS
500.0000 mg | ORAL_TABLET | Freq: Two times a day (BID) | ORAL | Status: DC
  Administered 2016-04-02: 500 mg via ORAL
  Filled 2016-04-02: qty 1

## 2016-04-02 MED ORDER — ASPIRIN EC 81 MG PO TBEC: 81.0000 mg | DELAYED_RELEASE_TABLET | Freq: Every day | ORAL | Status: DC

## 2016-04-02 MED ORDER — INSULIN ASPART 100 UNIT/ML ~~LOC~~ SOLN
5.0000 [IU] | Freq: Once | SUBCUTANEOUS | Status: AC
  Administered 2016-04-02: 5 [IU] via INTRAVENOUS
  Filled 2016-04-02: qty 5

## 2016-04-02 MED ORDER — SODIUM CHLORIDE 0.9 % IV BOLUS (SEPSIS): 1000.0000 mL | Freq: Once | INTRAVENOUS | Status: DC

## 2016-04-02 MED ORDER — DOXAZOSIN MESYLATE 1 MG PO TABS
1.0000 mg | ORAL_TABLET | Freq: Every day | ORAL | Status: DC
  Administered 2016-04-02: 1 mg via ORAL
  Filled 2016-04-02 (×2): qty 1

## 2016-04-02 MED ORDER — ONDANSETRON HCL 4 MG/2ML IJ SOLN: 4.0000 mg | Freq: Four times a day (QID) | INTRAMUSCULAR | Status: DC | PRN

## 2016-04-02 MED ORDER — PANTOPRAZOLE SODIUM 40 MG PO TBEC: 40.0000 mg | DELAYED_RELEASE_TABLET | Freq: Every day | ORAL | Status: DC

## 2016-04-02 MED ORDER — DOCUSATE SODIUM 100 MG PO CAPS
100.0000 mg | ORAL_CAPSULE | Freq: Two times a day (BID) | ORAL | Status: DC
  Administered 2016-04-02: 100 mg via ORAL
  Filled 2016-04-02: qty 1

## 2016-04-02 MED ORDER — HYDRALAZINE HCL 25 MG PO TABS
25.0000 mg | ORAL_TABLET | Freq: Three times a day (TID) | ORAL | Status: DC
  Administered 2016-04-02 – 2016-04-03 (×3): 25 mg via ORAL
  Filled 2016-04-02 (×3): qty 1

## 2016-04-02 MED ORDER — INSULIN NPH (HUMAN) (ISOPHANE) 100 UNIT/ML ~~LOC~~ SUSP: 10.0000 [IU] | Freq: Every day | SUBCUTANEOUS | Status: DC

## 2016-04-02 MED ORDER — AMLODIPINE BESYLATE 10 MG PO TABS: 10.0000 mg | ORAL_TABLET | Freq: Every day | ORAL | Status: DC

## 2016-04-02 MED ORDER — CLOPIDOGREL BISULFATE 75 MG PO TABS
75.0000 mg | ORAL_TABLET | Freq: Every day | ORAL | Status: DC
  Administered 2016-04-02: 75 mg via ORAL
  Filled 2016-04-02: qty 1

## 2016-04-02 MED ORDER — FINASTERIDE 5 MG PO TABS
5.0000 mg | ORAL_TABLET | Freq: Every day | ORAL | Status: DC
  Administered 2016-04-02: 5 mg via ORAL
  Filled 2016-04-02: qty 1

## 2016-04-02 MED ORDER — ATORVASTATIN CALCIUM 20 MG PO TABS
80.0000 mg | ORAL_TABLET | Freq: Every day | ORAL | Status: DC
  Administered 2016-04-02: 80 mg via ORAL
  Filled 2016-04-02: qty 4

## 2016-04-02 MED ORDER — ISOSORBIDE MONONITRATE ER 30 MG PO TB24: 30.0000 mg | ORAL_TABLET | Freq: Every day | ORAL | Status: DC

## 2016-04-02 MED ORDER — SODIUM CHLORIDE 0.9 % IV BOLUS (SEPSIS)
1000.0000 mL | Freq: Once | INTRAVENOUS | Status: AC
  Administered 2016-04-02: 1000 mL via INTRAVENOUS

## 2016-04-02 MED ORDER — SODIUM CHLORIDE 0.9% FLUSH: 3.0000 mL | INTRAVENOUS | Status: DC | PRN

## 2016-04-02 MED ORDER — SODIUM CHLORIDE 0.9% FLUSH: 3.0000 mL | Freq: Two times a day (BID) | INTRAVENOUS | Status: DC

## 2016-04-02 MED ORDER — ONDANSETRON HCL 4 MG/2ML IJ SOLN
4.0000 mg | Freq: Once | INTRAMUSCULAR | Status: AC
  Administered 2016-04-02: 4 mg via INTRAVENOUS
  Filled 2016-04-02: qty 2

## 2016-04-02 MED ORDER — ACETAMINOPHEN 650 MG RE SUPP: 650.0000 mg | Freq: Four times a day (QID) | RECTAL | Status: DC | PRN

## 2016-04-02 MED ORDER — SODIUM CHLORIDE 0.9% FLUSH
3.0000 mL | Freq: Two times a day (BID) | INTRAVENOUS | Status: DC
  Administered 2016-04-02: 3 mL via INTRAVENOUS

## 2016-04-02 MED ORDER — SODIUM POLYSTYRENE SULFONATE 15 GM/60ML PO SUSP: 30.0000 g | ORAL | Status: DC

## 2016-04-02 NOTE — Progress Notes (Signed)
Pt admitted to room 252. A&Ox4, VSS, no complaints at this time. Interpreter phone line used to complete admission. Pt oriented to room and unit, and educated on use of call bell, telephone, and safety. Safety contract signed. Skin assessed with Yaakov Guthrie, RN. Telemetry verified with Yaakov Guthrie, RN. RN will continue to monitor and treat per MD orders. Syliva Overman, RN

## 2016-04-02 NOTE — Progress Notes (Signed)
[facility]  ROUNDING NOTE   Subjective:  Pt well known to [place] from prior admissions. Comes now with weakness. Found to have hyperkalemia. Had HD yesterday and didn't sign off early. Had coffee and biscuit for dinner. Denies oranges, bananas, tomatoes since dialysis yesterday.   Objective:  Vital signs in last 24 hours:  Temp:  [98.1 F (36.7 C)] (P) 98.1 F (36.7 C) (07/28 1100) Pulse Rate:  [73-76] 76 (07/28 1529) Resp:  [18-30] 18 (07/28 1529) BP: (165-213)/(65-86) 165/68 (07/28 1529) SpO2:  [97 %-100 %] 97 % (07/28 1529)  Weight change:  There were no vitals filed for this visit.  Intake/Output: No intake/output data recorded.   Intake/Output this shift:  No intake/output data recorded.  Physical Exam: General: No acute distress  Head: Normocephalic, atraumatic. Moist oral mucosal membranes  Eyes: Anicteric  Neck: Supple, trachea midline  Lungs:  Clear to auscultation, normal effort  Heart: S1S2 no rubs  Abdomen:  Soft, nontender, BS present  Extremities:  peripheral edema.  Neurologic: Nonfocal, moving all four extremities  Skin: No lesions  Access: LUE aneurysmal AVF    Basic Metabolic Panel:  Recent Labs Lab 04/02/16 1133 04/02/16 1310  NA 132* 133*  K 6.7* 6.6*  CL 95* 95*  CO2 26 26  GLUCOSE 308* 307*  BUN 27* 28*  CREATININE 5.42* 5.66*  CALCIUM 9.5 9.6    Liver Function Tests:  Recent Labs Lab 04/02/16 1133 04/02/16 1310  AST 22 21  ALT 12* 11*  ALKPHOS 91 102  BILITOT 0.7 1.0  PROT 7.6 7.9  ALBUMIN 4.2 4.4   No results for input(s): LIPASE, AMYLASE in the last 168 hours. No results for input(s): AMMONIA in the last 168 hours.  CBC:  Recent Labs Lab 04/02/16 1133  WBC 10.2  NEUTROABS 7.1*  HGB 13.4  HCT 39.2*  MCV 95.9  PLT 173    Cardiac Enzymes:  Recent Labs Lab 04/02/16 1133  CKTOTAL 248    BNP: Invalid input(s): POCBNP  CBG: No results for input(s): GLUCAP in the last 168  hours.  Microbiology: Results for orders placed or performed during the hospital encounter of 10/21/15  MRSA PCR Screening     Status: None   Collection Time: 10/21/15  6:34 AM  Result Value Ref Range Status   MRSA by PCR NEGATIVE NEGATIVE Final    Comment:        The GeneXpert MRSA Assay (FDA approved for NASAL specimens only), is one component of a comprehensive MRSA colonization surveillance program. It is not intended to diagnose MRSA infection nor to guide or monitor treatment for MRSA infections.   Culture, blood (routine x 2)     Status: None   Collection Time: 10/21/15  8:08 AM  Result Value Ref Range Status   Specimen Description BLOOD RIGHT HAND  Final   Special Requests BOTTLES DRAWN AEROBIC AND ANAEROBIC  0.5CC  Final   Culture NO GROWTH 5 DAYS  Final   Report Status 10/26/2015 FINAL  Final  Culture, blood (routine x 2)     Status: None   Collection Time: 10/21/15  8:13 AM  Result Value Ref Range Status   Specimen Description BLOOD RIGHT WRIST  Final   Special Requests BOTTLES DRAWN AEROBIC AND ANAEROBIC  0.5CC  Final   Culture NO GROWTH 5 DAYS  Final   Report Status 10/26/2015 FINAL  Final    Coagulation Studies: No results for input(s): LABPROT, INR in the last 72 hours.  Urinalysis:  No results for input(s): COLORURINE, LABSPEC, PHURINE, GLUCOSEU, HGBUR, BILIRUBINUR, KETONESUR, PROTEINUR, UROBILINOGEN, NITRITE, LEUKOCYTESUR in the last 72 hours.  Invalid input(s): APPERANCEUR    Imaging: No results found.   Medications:   . calcium gluconate 1 GM IV 1 g (04/02/16 1620)   . sodium polystyrene  30 g Oral STAT     Assessment/ Plan:  50 y.o. male with uncontrolled diabetes mellitus type II, hypertension, hyperlipidemia, hypothyroidism, BPH, coronary artery disease status post stent.   [facility]  1. End stage renal disease: Pt had HD yesterday, but now with hyperkalemia, will proceed with HD now.   2. Hyperkalemia:   Will plan for urgent HD now, will use 1K bath x one hour, then switch to 2k bath.   3. Anemia of CKD:  hgb 13.4, hold epogen.  4.  Secondary hyperparathyroidism: follow phos during hospitalization.   LOS: 0 Micala Saltsman 7/28/20174:37 PM

## 2016-04-02 NOTE — ED Triage Notes (Signed)
Pt states he had dialysis yesterday and since he has been having pain in his legs, pt unable to sleep last night due to pain, pt is breathing heavy in triage and states some difficulty with his breathing. Wife states that he fell today with pain and weakness in his legs

## 2016-04-02 NOTE — Progress Notes (Signed)
PRE HD ASSESSMENT 

## 2016-04-02 NOTE — ED Notes (Signed)
Patient states that he has chronic pain in both of legs but has been worse over the last 15 days. Patient also started having lower back pain this morning. Patient states that he feels like his legs are swollen and that he gets a sensation of air shooting through the bone.   Patient is dialysis patient, T, TH, S. Patient had his full dialysis treatment on Thursday.

## 2016-04-02 NOTE — ED Notes (Signed)
IV access attempted x 2 unsuccessfully  

## 2016-04-02 NOTE — Progress Notes (Signed)
HD start 

## 2016-04-02 NOTE — Progress Notes (Signed)
PRE HD   

## 2016-04-02 NOTE — ED Provider Notes (Signed)
[facility] Emergency Department Provider Note   ____________________________________________   First MD Initiated Contact with Patient 04/02/16 1128     (approximate)  I have reviewed the triage vital signs and the nursing notes.   HISTORY  Chief Complaint Weakness    HPI Marcus [name] is a 50 y.o. male with CAD, end-stage renal disease on dialysis, last dialyzed completely yesterday, diabetes who presents for evaluation of worsening bilateral lower extremity pain, ongoing for several years, worse since yesterday, no trauma, no modifying factors, currently severe. Patient reports that this happens infrequently after he has dialysis. He told his doctor about this pain after dialysis and they told him that they would give him a medication for it however he reports that he has not received it. He has taken Tylenol without improvement. No fevers or chills, no vomiting or diarrhea. No chest pain or difficulty breathing. He feels as if his legs "are like they have holes in them and they get so cold".  Past Medical History:  Diagnosis Date  . BPH (benign prostatic hyperplasia)   . CAD (coronary artery disease) of artery bypass graft    a. s/p multipe PCI to LCx and OMI; Last cath in 07/2014 w/ POBA to 70% stenosis in LCx   . Chronic diastolic CHF (congestive heart failure) (HCC)    a. EF 55-60% by echo in 08/2015; Grade 2 DD noted.  . Depression   . Diabetes mellitus without complication (HCC)   . Dialysis patient [facility])    a. Tue/Th/Sat Schedule  . High cholesterol   . Hypertension   . Renal disorder   . Thyroid disease     Patient Active Problem List   Diagnosis Date Noted  . CAD in native artery 10/22/2015  . Chronic diastolic heart failure (HCC) 10/22/2015  . Syncope 10/22/2015  . ESRD (end stage renal disease) on dialysis (HCC) 10/22/2015  . CAP (community acquired pneumonia) 10/22/2015  . Aspiration pneumonia of right lung (HCC)   .  Hyperglycemia   . Syncope and collapse   . Hypoxia 10/21/2015    Past Surgical History:  Procedure Laterality Date  . CARDIAC CATHETERIZATION    . DIALYSIS FISTULA CREATION      Prior to Admission medications   Medication Sig Start Date End Date Taking? Authorizing Provider  amLODipine (NORVASC) 10 MG tablet Take 10 mg by mouth daily.    Historical Provider, MD  aspirin EC 81 MG tablet Take 81 mg by mouth daily.    Historical Provider, MD  atorvastatin (LIPITOR) 80 MG tablet Take 80 mg by mouth at bedtime.    Historical Provider, MD  calcium acetate (PHOSLO) 667 MG capsule Take 167 mg by mouth daily.    Historical Provider, MD  carvedilol (COREG) 25 MG tablet Take 25 mg by mouth 2 (two) times daily with a meal.    Historical Provider, MD  clopidogrel (PLAVIX) 75 MG tablet Take 75 mg by mouth at bedtime.    Historical Provider, MD  doxazosin (CARDURA) 1 MG tablet Take 1 mg by mouth at bedtime.    Historical Provider, MD  finasteride (PROSCAR) 5 MG tablet Take 5 mg by mouth at bedtime.    Historical Provider, MD  FLUoxetine (PROZAC) 40 MG capsule Take 40 mg by mouth daily.    Historical Provider, MD  insulin aspart protamine- aspart (NOVOLOG MIX 70/30) (70-30) 100 UNIT/ML injection Inject 0.05 mLs (5 Units total) into the skin daily with breakfast. 10/24/15   Cletis Athens  Hower, MD  insulin aspart protamine- aspart (NOVOLOG MIX 70/30) (70-30) 100 UNIT/ML injection Inject 0.1 mLs (10 Units total) into the skin daily with supper. 10/24/15   Wyatt Haste, MD  insulin NPH Human (NOVOLIN N) 100 UNIT/ML injection Inject 0.1 mLs (10 Units total) into the skin at bedtime. 10/23/15   Wyatt Haste, MD  insulin NPH-regular Human (NOVOLIN 70/30) (70-30) 100 UNIT/ML injection Inject 5 Units into the skin daily with breakfast. 10/23/15   Wyatt Haste, MD  isosorbide mononitrate (IMDUR) 120 MG 24 hr tablet Take 120 mg by mouth daily.    Historical Provider, MD  levofloxacin (LEVAQUIN) 500 MG tablet Take 1 tablet  (500 mg total) by mouth daily. 10/23/15   Wyatt Haste, MD  levothyroxine (SYNTHROID, LEVOTHROID) 100 MCG tablet Take 100 mcg by mouth daily before breakfast.    Historical Provider, MD  linagliptin (TRADJENTA) 5 MG TABS tablet Take 5 mg by mouth at bedtime.    Historical Provider, MD  lisinopril (PRINIVIL,ZESTRIL) 40 MG tablet Take 40 mg by mouth at bedtime.    Historical Provider, MD  LORazepam (ATIVAN) 0.5 MG tablet Take 0.5 mg by mouth at bedtime.    Historical Provider, MD  nitroGLYCERIN (NITROSTAT) 0.4 MG SL tablet Take 0.4 mg by mouth every 5 (five) minutes as needed. 10/24/12   Historical Provider, MD  omeprazole (PRILOSEC) 40 MG capsule Take 40 mg by mouth daily.    Historical Provider, MD  oxyCODONE-acetaminophen (ROXICET) 5-325 MG tablet Take 1 tablet by mouth every 6 (six) hours as needed. 11/29/15 11/28/16  Rockne Menghini, MD    Allergies Review of patient's allergies indicates no known allergies.  No family history on file.  Social History Social History  Substance Use Topics  . Smoking status: Current Some Day Smoker  . Smokeless tobacco: Never Used  . Alcohol use No    Review of Systems Constitutional: No fever/chills Eyes: No visual changes. ENT: No sore throat. Cardiovascular: Denies chest pain. Respiratory: Denies shortness of breath. Gastrointestinal: No abdominal pain.  No nausea, no vomiting.  No diarrhea.  No constipation. Genitourinary: Negative for dysuria. Musculoskeletal: Negative for back pain. Skin: Negative for rash. Neurological: Negative for headaches, focal weakness or numbness.  10-point ROS otherwise negative.  ____________________________________________   PHYSICAL EXAM:  VITAL SIGNS: ED Triage Vitals  Enc Vitals Group     BP 04/02/16 1100 (!) (P) 209/65     Pulse Rate 04/02/16 1100 (P) 76     Resp 04/02/16 1100 (!) (P) 28     Temp 04/02/16 1100 (P) 98.1 F (36.7 C)     Temp Source 04/02/16 1100 (P) Oral     SpO2 04/02/16 1100  (P) 100 %     Weight --      Height 04/02/16 1100 (P) 5\' 2"  (1.575 m)     Head Circumference --      Peak Flow --      Pain Score 04/02/16 1127 10     Pain Loc --      Pain Edu? --      Excl. in GC? --     Constitutional: Alert and oriented. Well appearing and in no acute distress. Eyes: Conjunctivae are normal. PERRL. EOMI. Head: Atraumatic. Nose: No congestion/rhinnorhea. Mouth/Throat: Mucous membranes are moist.  Oropharynx non-erythematous. Neck: No stridor.   Cardiovascular: Normal rate, regular rhythm. Grossly normal heart sounds.  Good peripheral circulation. Respiratory: Normal respiratory effort.  No retractions. Lungs CTAB. Gastrointestinal: Soft and nontender. No  distention.  No CVA tenderness. Genitourinary: deferred Musculoskeletal: No lower extremity tenderness nor edema. No bony step-off or deformity. 2+ DP pulse bilaterally, wiggles the toes. Neurologic:  Normal speech and language. No gross focal neurologic deficits are appreciated. 5 out of 5 strength in bilateral upper and lower extremities, sensation intact to light touch throughout. Skin:  Skin is warm, dry and intact. No rash noted. Psychiatric: Mood and affect are normal. Speech and behavior are normal.  ____________________________________________   LABS (all labs ordered are listed, but only abnormal results are displayed)  Labs Reviewed  CBC WITH DIFFERENTIAL/PLATELET - Abnormal; Notable for the following:       Result Value   RBC 4.09 (*)    HCT 39.2 (*)    RDW 15.4 (*)    Neutro Abs 7.1 (*)    Eosinophils Absolute 0.8 (*)    All other components within normal limits  COMPREHENSIVE METABOLIC PANEL - Abnormal; Notable for the following:    Sodium 132 (*)    Potassium 6.7 (*)    Chloride 95 (*)    Glucose, Bld 308 (*)    BUN 27 (*)    Creatinine, Ser 5.42 (*)    ALT 12 (*)    GFR calc non Af Amer 11 (*)    GFR calc Af Amer 13 (*)    All other components within normal limits  COMPREHENSIVE  METABOLIC PANEL - Abnormal; Notable for the following:    Sodium 133 (*)    Potassium 6.6 (*)    Chloride 95 (*)    Glucose, Bld 307 (*)    BUN 28 (*)    Creatinine, Ser 5.66 (*)    ALT 11 (*)    GFR calc non Af Amer 10 (*)    GFR calc Af Amer 12 (*)    All other components within normal limits  CK   ____________________________________________  EKG  ED ECG REPORT I, Gayla Doss, the attending physician, personally viewed and interpreted this ECG.   Date: 04/02/2016  EKG Time: 13:18  Rate: 78  Rhythm: normal sinus rhythm  Axis: right  Intervals:none  ST&T Change: No acute ST elevation or acute ST depression. Tell T waves in V2, V3, V4 however this was seen on his prior EKG in February. T-wave inversions in V6.  ____________________________________________  RADIOLOGY  none ____________________________________________   PROCEDURES  Procedure(s) performed: None  Procedures  Critical Care performed:   CRITICAL CARE Performed by: Toney Rakes A   Total critical care time: 30 minutes  Critical care time was exclusive of separately billable procedures and treating other patients.  Critical care was necessary to treat or prevent imminent or life-threatening deterioration.  Critical care was time spent personally by me on the following activities: development of treatment plan with patient and/or surrogate as well as nursing, discussions with consultants, evaluation of patient's response to treatment, examination of patient, obtaining history from patient or surrogate, ordering and performing treatments and interventions, ordering and review of laboratory studies, ordering and review of radiographic studies, pulse oximetry and re-evaluation of patient's condition.  ____________________________________________   INITIAL IMPRESSION / ASSESSMENT AND PLAN / ED COURSE  Pertinent labs & imaging results that were available during my care of the patient were reviewed by  me and considered in my medical decision making (see chart for details). Marcus [name] is a 50 y.o. male with CAD, end-stage renal disease on dialysis, last dialyzed completely yesterday, diabetes who presents for evaluation of worsening bilateral lower  extremity pain. On exam, he is well-appearing and in no acute distress. Vital signs show hypertension however he is asymptomatic and I suspect this is his baseline given his end-stage renal disease. The remainder of his vital signs are stable. He is afebrile. He is neurovascularly intact in both legs, no weakness, both legs are warm and well perfused, there is no trauma, no bony step-off or deformity. Exacerbation of his chronic bilateral lower extremity pain. We'll treat with a small dose of Roxicodone, obtain screening labs and reassess for disposition.  ----------------------------------------- 2:43 PM on 04/02/2016 -----------------------------------------  Labs show hyperkalemia, initial potassium 6.7, on recheck it is 6.6. EKG shows chronically peaked T waves. Case discussed with Dr. Cherylann Ratel who will dialyze the patient emergently. We'll give IV fluids, insulin, no need for dextrose right now as his glucose on arrival was 307, he received calcium gluconate. I discussed the case with the hospitalist for admission.  Clinical Course     ____________________________________________   FINAL CLINICAL IMPRESSION(S) / ED DIAGNOSES  Final diagnoses:  Pain of lower extremity, unspecified laterality  Weakness  Hyperkalemia      NEW MEDICATIONS STARTED DURING THIS VISIT:  New Prescriptions   No medications on file     Note:  This document was prepared using Dragon voice recognition software and may include unintentional dictation errors.    Gayla Doss, MD 04/02/16 1600

## 2016-04-02 NOTE — ED Notes (Signed)
Patient being taken to Hemodialysis by ED tech.

## 2016-04-02 NOTE — Progress Notes (Signed)
Post dialysis 

## 2016-04-02 NOTE — ED Notes (Addendum)
HD called and asked that we hold Kayexalate due to patient having dialysis and this may drop his potassium too low. Dr. Amado Coe informed. Verbal order given to Hold Kayexalate

## 2016-04-02 NOTE — H&P (Addendum)
[facility] [place] at [facility]   PATIENT NAME: Marcus [name]    MR#:  [id number]  DATE OF BIRTH:  March 27, 1966  DATE OF ADMISSION:  04/02/2016  PRIMARY CARE PHYSICIAN: [facility] [place]   REQUESTING/REFERRING PHYSICIAN: GAYLE  CHIEF COMPLAINT:  Weakness and lower extremity cramps  HISTORY OF PRESENT ILLNESS:  Marcus [name]  is a 50 y.o. male with a known history of End-stage renal disease on hemodialysis on Tuesday, Thursday and Saturday is presenting to the ED with chief complaint of worsening of muscle cramps especially in his lower extremity. His last hemodialysis was thoroughly on Thursday and then and the reporting nausea but denies any vomiting. Denies any abdominal pain. In the ED his potassium is found to be at 6.7 EKG with hyperacute T waves. The ED physician has given him calcium gluconate and insulin and notified to on-call nephrologist Dr. Cherylann Ratel regarding patient's clinical situation and he is coming to do urgent hemodialysis on this patient. During my examination patient denies any chest pain or shortness of breath and resting comfortably. Wife at bedside  PAST MEDICAL HISTORY:   Past Medical History:  Diagnosis Date  . BPH (benign prostatic hyperplasia)   . CAD (coronary artery disease) of artery bypass graft    a. s/p multipe PCI to LCx and OMI; Last cath in 07/2014 w/ POBA to 70% stenosis in LCx   . Chronic diastolic CHF (congestive heart failure) (HCC)    a. EF 55-60% by echo in 08/2015; Grade 2 DD noted.  . Depression   . Diabetes mellitus without complication (HCC)   . Dialysis patient [facility])    a. Tue/Th/Sat Schedule  . High cholesterol   . Hypertension   . Renal disorder   . Thyroid disease     PAST SURGICAL HISTOIRY:   Past Surgical History:  Procedure Laterality Date  . CARDIAC CATHETERIZATION    . DIALYSIS FISTULA CREATION      SOCIAL HISTORY:   Social History  Substance Use Topics  . Smoking status:  Current Some Day Smoker  . Smokeless tobacco: Never Used  . Alcohol use No    FAMILY HISTORY:  Diabetes mellitus  DRUG ALLERGIES:  No Known Allergies  REVIEW OF SYSTEMS:  CONSTITUTIONAL: No fever, fatigue or weakness.  EYES: No blurred or double vision.  EARS, NOSE, AND THROAT: No tinnitus or ear pain.  RESPIRATORY: No cough, shortness of breath, wheezing or hemoptysis.  CARDIOVASCULAR: No chest pain, orthopnea, edema.  GASTROINTESTINAL: No nausea, vomiting, diarrhea or abdominal pain.  GENITOURINARY: No dysuria, hematuria.  ENDOCRINE: No polyuria, nocturia,  HEMATOLOGY: No anemia, easy bruising or bleeding SKIN: No rash or lesion. MUSCULOSKELETAL:Reporting muscle cramps No joint pain or arthritis.   NEUROLOGIC: No tingling, numbness, weakness. Reporting tingling and burning in his lower extremity PSYCHIATRY: No anxiety or depression.   MEDICATIONS AT HOME:   Prior to Admission medications   Medication Sig Start Date End Date Taking? Authorizing Provider  amLODipine (NORVASC) 10 MG tablet Take 10 mg by mouth daily.   Yes Historical Provider, MD  aspirin EC 81 MG tablet Take 81 mg by mouth daily.   Yes Historical Provider, MD  atorvastatin (LIPITOR) 80 MG tablet Take 80 mg by mouth at bedtime.   Yes Historical Provider, MD  calcium acetate (PHOSLO) 667 MG capsule Take 667 mg by mouth 3 (three) times daily with meals.    Yes Historical Provider, MD  carvedilol (COREG) 25 MG tablet Take 25 mg by  mouth 2 (two) times daily with a meal.   Yes Historical Provider, MD  clopidogrel (PLAVIX) 75 MG tablet Take 75 mg by mouth at bedtime.   Yes Historical Provider, MD  doxazosin (CARDURA) 1 MG tablet Take 1 mg by mouth at bedtime.   Yes Historical Provider, MD  finasteride (PROSCAR) 5 MG tablet Take 5 mg by mouth at bedtime.   Yes Historical Provider, MD  FLUoxetine (PROZAC) 40 MG capsule Take 40 mg by mouth daily.   Yes Historical Provider, MD  hydrALAZINE (APRESOLINE) 25 MG tablet Take 25  mg by mouth every 8 (eight) hours.   Yes Historical Provider, MD  insulin NPH Human (NOVOLIN N) 100 UNIT/ML injection Inject 0.1 mLs (10 Units total) into the skin at bedtime. Patient taking differently: Inject 5-10 Units into the skin 2 (two) times daily. Inject 10 units in morning and inject 5 units in evening 10/23/15  Yes Wyatt Haste, MD  isosorbide mononitrate (IMDUR) 30 MG 24 hr tablet Take 30 mg by mouth daily.   Yes Historical Provider, MD  levETIRAcetam (KEPPRA) 500 MG tablet Take 500 mg by mouth 2 (two) times daily.   Yes Historical Provider, MD  levothyroxine (SYNTHROID, LEVOTHROID) 100 MCG tablet Take 100 mcg by mouth daily before breakfast.   Yes Historical Provider, MD  lisinopril (PRINIVIL,ZESTRIL) 40 MG tablet Take 40 mg by mouth at bedtime.   Yes Historical Provider, MD  nitroGLYCERIN (NITROSTAT) 0.4 MG SL tablet Take 0.4 mg by mouth every 5 (five) minutes as needed. 10/24/12  Yes Historical Provider, MD  omeprazole (PRILOSEC) 20 MG capsule Take 40 mg by mouth daily.   Yes Historical Provider, MD  insulin aspart protamine- aspart (NOVOLOG MIX 70/30) (70-30) 100 UNIT/ML injection Inject 0.05 mLs (5 Units total) into the skin daily with breakfast. 10/24/15   Wyatt Haste, MD  insulin aspart protamine- aspart (NOVOLOG MIX 70/30) (70-30) 100 UNIT/ML injection Inject 0.1 mLs (10 Units total) into the skin daily with supper. 10/24/15   Wyatt Haste, MD  insulin NPH-regular Human (NOVOLIN 70/30) (70-30) 100 UNIT/ML injection Inject 5 Units into the skin daily with breakfast. 10/23/15   Wyatt Haste, MD  isosorbide mononitrate (IMDUR) 120 MG 24 hr tablet Take 120 mg by mouth daily.    Historical Provider, MD  linagliptin (TRADJENTA) 5 MG TABS tablet Take 5 mg by mouth at bedtime.    Historical Provider, MD  LORazepam (ATIVAN) 0.5 MG tablet Take 0.5 mg by mouth at bedtime.    Historical Provider, MD  omeprazole (PRILOSEC) 40 MG capsule Take 40 mg by mouth daily.    Historical Provider, MD       VITAL SIGNS:  Blood pressure (!) 165/68, pulse 76, temperature (P) 98.1 F (36.7 C), temperature source (P) Oral, resp. rate 18, height (P) 5\' 2"  (1.575 m), SpO2 97 %.  PHYSICAL EXAMINATION:  GENERAL:  50 y.o.-year-old patient lying in the bed with no acute distress.  EYES: Pupils equal, round, reactive to light and accommodation. No scleral icterus. Extraocular muscles intact.  HEENT: Head atraumatic, normocephalic. Oropharynx and nasopharynx clear.  NECK:  Supple, no jugular venous distention. No thyroid enlargement, no tenderness.  LUNGS: Normal breath sounds bilaterally, no wheezing, rales,rhonchi or crepitation. No use of accessory muscles of respiration.  CARDIOVASCULAR: S1, S2 normal. No murmurs, rubs, or gallops.  ABDOMEN: Soft, nontender, nondistended. Bowel sounds present. No organomegaly or mass.  EXTREMITIES: Left upper extremity with AV fistula with bruit No pedal edema, cyanosis, or clubbing.  NEUROLOGIC: Cranial nerves II through XII are intact. Muscle strength 5/5 in all extremities. Sensation intact in upper extremities but diminished in the lower extremities. Gait not checked.  PSYCHIATRIC: The patient is alert and oriented x 3.  SKIN: No obvious rash, lesion, or ulcer.   LABORATORY PANEL:   CBC  Recent Labs Lab 04/02/16 1133  WBC 10.2  HGB 13.4  HCT 39.2*  PLT 173   ------------------------------------------------------------------------------------------------------------------  Chemistries   Recent Labs Lab 04/02/16 1310  NA 133*  K 6.6*  CL 95*  CO2 26  GLUCOSE 307*  BUN 28*  CREATININE 5.66*  CALCIUM 9.6  AST 21  ALT 11*  ALKPHOS 102  BILITOT 1.0   ------------------------------------------------------------------------------------------------------------------  Cardiac Enzymes No results for input(s): TROPONINI in the last 168  hours. ------------------------------------------------------------------------------------------------------------------  RADIOLOGY:  No results found.  EKG:   Orders placed or performed during the hospital encounter of 04/02/16  . ED EKG  . ED EKG  . EKG 12-Lead  . EKG 12-Lead    IMPRESSION AND PLAN:   Jameal Razzano  is a 50 y.o. male with a known history of End-stage renal disease on hemodialysis on Tuesday, Thursday and Saturday is presenting to the ED with chief complaint of worsening of muscle cramps especially in his lower extremity. His last hemodialysis was thoroughly on Thursday and then and the reporting nausea but denies any vomiting. Denies any abdominal pain. In the ED his potassium is found to be at 6.7 EKG with  hyperacute T waves.  #Hyperkalemia Calcium gluconate and insulin was given in the ED. We will go ahead and give Kayexalate. Urgent consult was placed to nephrology to get hemodialysis as soon as possible Will repeat BMP after hemodialysis Holding patient's home medication lisinopril  #End stage renal disease Continue hemodialysis on Tuesday, Thursday and Saturday and follow-up with nephrology  #Insulin-dependent diabetes mellitus Continue patient's home medication NPH 5 units in a.m. and 10 units daily at bedtime Sliding scale insulin Started patient on Neurontin for diabetic neuropathy  # essential hypertension Blood pressure is elevated at this time Patient is getting dialysis and will continue his home medications including Coreg, Cardura and Imdur. Holding on lisinopril in view of hyperkalemia  #Hypothyroidism continue Synthroid  DVT prophylaxis with Lovenox adjusted to renal dose  All the records are reviewed and case discussed with ED provider. Management plans discussed with the patient, family with the help of Spanish interpreter Ms. Annice Pih, they're in agreement with the current plan  CODE STATUS: fc/wife is POA  TOTAL critical care  TIME TAKING CARE OF THIS PATIENT: 45 minutes.   Note: This dictation was prepared with Dragon dictation along with smaller phrase technology. Any transcriptional errors that result from this process are unintentional.  Ramonita Lab M.D on 04/02/2016 at 4:09 PM  Between 7am to 6pm - Pager - [phone]  After 6pm go to [email or web address] - password [email or web address] [facility]  [place] [facility]  Office  [phone]  CC: Primary care physician; [facility] [place]

## 2016-04-03 LAB — PHOSPHORUS
PHOSPHORUS: 2.6 mg/dL (ref 2.5–4.6)
PHOSPHORUS: 3.8 mg/dL (ref 2.5–4.6)

## 2016-04-03 LAB — HEPATITIS B SURFACE ANTIGEN: HEP B S AG: NEGATIVE

## 2016-04-03 LAB — HEPATITIS B SURFACE ANTIBODY,QUALITATIVE: HEP B S AB: REACTIVE

## 2016-04-03 NOTE — Progress Notes (Signed)
HD COMPLETED  

## 2016-04-03 NOTE — Progress Notes (Signed)
PRE DIALYSIS ASSESSMENT 

## 2016-04-03 NOTE — Discharge Summary (Signed)
[facility] - [place] at [facility]   PATIENT NAME: Marcus [name]    MR#:  [id number]  DATE OF BIRTH:  Feb 08, 1966  DATE OF ADMISSION:  04/02/2016 ADMITTING PHYSICIAN: Ramonita Lab, MD  DATE OF DISCHARGE: 04/03/16  PRIMARY CARE PHYSICIAN: [facility] [place]    ADMISSION DIAGNOSIS:  Hyperkalemia [E87.5] Weakness [R53.1] Pain of lower extremity, unspecified laterality [M79.606]  DISCHARGE DIAGNOSIS:  Active Problems:   Hyperkalemia   SECONDARY DIAGNOSIS:   Past Medical History:  Diagnosis Date  . BPH (benign prostatic hyperplasia)   . CAD (coronary artery disease) of artery bypass graft    a. s/p multipe PCI to LCx and OMI; Last cath in 07/2014 w/ POBA to 70% stenosis in LCx   . Chronic diastolic CHF (congestive heart failure) (HCC)    a. EF 55-60% by echo in 08/2015; Grade 2 DD noted.  . Depression   . Diabetes mellitus without complication (HCC)   . Dialysis patient [facility])    a. Tue/Th/Sat Schedule  . High cholesterol   . Hypertension   . Renal disorder   . Thyroid disease     HOSPITAL COURSE:  Marcus [name]  is a 50 y.o. male admitted 04/02/2016 with chief complaint Weakness . Please see H&P performed by Ramonita Lab, MD for further information. Patient presented with leg cramping and generalized weakness. On ED workup found to have elevated potassium with corrisponding EKG changes. Nephrology consulted who performed urgent hemodialysis with improvement of both bloodwork and symptoms. He also had his scheduled dialysis priro to discharge (Tuesday, Thursday, Saturday)  DISCHARGE CONDITIONS:   stable  CONSULTS OBTAINED:    DRUG ALLERGIES:  No Known Allergies  DISCHARGE MEDICATIONS:  No medication changes were made on this visit   Current Discharge Medication List    CONTINUE these medications which have NOT CHANGED   Details  amLODipine (NORVASC) 10 MG tablet Take 10 mg by mouth daily.    aspirin EC 81 MG tablet Take 81  mg by mouth daily.    atorvastatin (LIPITOR) 80 MG tablet Take 80 mg by mouth at bedtime.    calcium acetate (PHOSLO) 667 MG capsule Take 667 mg by mouth 3 (three) times daily with meals.     carvedilol (COREG) 25 MG tablet Take 25 mg by mouth 2 (two) times daily with a meal.    clopidogrel (PLAVIX) 75 MG tablet Take 75 mg by mouth at bedtime.    doxazosin (CARDURA) 1 MG tablet Take 1 mg by mouth at bedtime.    finasteride (PROSCAR) 5 MG tablet Take 5 mg by mouth at bedtime.    FLUoxetine (PROZAC) 40 MG capsule Take 40 mg by mouth daily.    hydrALAZINE (APRESOLINE) 25 MG tablet Take 25 mg by mouth every 8 (eight) hours.    !! isosorbide mononitrate (IMDUR) 30 MG 24 hr tablet Take 30 mg by mouth daily.    levETIRAcetam (KEPPRA) 500 MG tablet Take 500 mg by mouth 2 (two) times daily.    levothyroxine (SYNTHROID, LEVOTHROID) 100 MCG tablet Take 100 mcg by mouth daily before breakfast.    lisinopril (PRINIVIL,ZESTRIL) 40 MG tablet Take 40 mg by mouth at bedtime.    nitroGLYCERIN (NITROSTAT) 0.4 MG SL tablet Take 0.4 mg by mouth every 5 (five) minutes as needed.    omeprazole (PRILOSEC) 20 MG capsule Take 40 mg by mouth daily.    !! insulin aspart protamine- aspart (NOVOLOG MIX 70/30) (70-30) 100 UNIT/ML injection Inject 0.05 mLs (5  Units total) into the skin daily with breakfast. Qty: 10 mL, Refills: 11    !! insulin aspart protamine- aspart (NOVOLOG MIX 70/30) (70-30) 100 UNIT/ML injection Inject 0.1 mLs (10 Units total) into the skin daily with supper. Qty: 10 mL, Refills: 11    !! isosorbide mononitrate (IMDUR) 120 MG 24 hr tablet Take 120 mg by mouth daily.    linagliptin (TRADJENTA) 5 MG TABS tablet Take 5 mg by mouth at bedtime.    LORazepam (ATIVAN) 0.5 MG tablet Take 0.5 mg by mouth at bedtime.     !! - Potential duplicate medications found. Please discuss with provider.    STOP taking these medications     insulin NPH Human (NOVOLIN N) 100 UNIT/ML injection       insulin NPH-regular Human (NOVOLIN 70/30) (70-30) 100 UNIT/ML injection          DISCHARGE INSTRUCTIONS:    DIET:  Renal diet  DISCHARGE CONDITION:  Stable  ACTIVITY:  Activity as tolerated  OXYGEN:  Home Oxygen: No.   Oxygen Delivery: room air  DISCHARGE LOCATION:  home   If you experience worsening of your admission symptoms, develop shortness of breath, life threatening emergency, suicidal or homicidal thoughts you must seek medical attention immediately by calling 911 or calling your MD immediately  if symptoms less severe.  You Must read complete instructions/literature along with all the possible adverse reactions/side effects for all the Medicines you take and that have been prescribed to you. Take any new Medicines after you have completely understood and accpet all the possible adverse reactions/side effects.   Please note  You were cared for by a hospitalist during your hospital stay. If you have any questions about your discharge medications or the care you received while you were in the hospital after you are discharged, you can call the unit and asked to speak with the hospitalist on call if the hospitalist that took care of you is not available. Once you are discharged, your primary care physician will handle any further medical issues. Please note that NO REFILLS for any discharge medications will be authorized once you are discharged, as it is imperative that you return to your primary care physician (or establish a relationship with a primary care physician if you do not have one) for your aftercare needs so that they can reassess your need for medications and monitor your lab values.    On the day of Discharge:   VITAL SIGNS:  Blood pressure (!) 171/67, pulse 71, temperature 98.5 F (36.9 C), resp. rate 15, height 5\' 2"  (1.575 m), weight 140 lb 14.4 oz (63.9 kg), SpO2 99 %.  I/O:   Intake/Output Summary (Last 24 hours) at 04/03/16 1053 Last data filed  at 04/02/16 2031  Gross per 24 hour  Intake             1000 ml  Output             1500 ml  Net             -500 ml    PHYSICAL EXAMINATION:  GENERAL:  50 y.o.-year-old patient lying in the bed with no acute distress.  EYES: Pupils equal, round, reactive to light and accommodation. No scleral icterus. Extraocular muscles intact.  HEENT: Head atraumatic, normocephalic. Oropharynx and nasopharynx clear.  NECK:  Supple, no jugular venous distention. No thyroid enlargement, no tenderness.  LUNGS: Normal breath sounds bilaterally, no wheezing, rales,rhonchi or crepitation. No use of accessory muscles of  respiration.  CARDIOVASCULAR: S1, S2 normal. No murmurs, rubs, or gallops.  ABDOMEN: Soft, non-tender, non-distended. Bowel sounds present. No organomegaly or mass.  EXTREMITIES: No pedal edema, cyanosis, or clubbing.  NEUROLOGIC: Cranial nerves II through XII are intact. Muscle strength 5/5 in all extremities. Sensation intact. Gait not checked.  PSYCHIATRIC: The patient is alert and oriented x 3.  SKIN: No obvious rash, lesion, or ulcer.   DATA REVIEW:   CBC  Recent Labs Lab 04/02/16 1133  WBC 10.2  HGB 13.4  HCT 39.2*  PLT 173    Chemistries   Recent Labs Lab 04/02/16 1310 04/02/16 2116  NA 133* 140  K 6.6* 3.8  CL 95* 99*  CO2 26 32  GLUCOSE 307* 102*  BUN 28* 9  CREATININE 5.66* 2.35*  CALCIUM 9.6 8.4*  AST 21  --   ALT 11*  --   ALKPHOS 102  --   BILITOT 1.0  --     Cardiac Enzymes No results for input(s): TROPONINI in the last 168 hours.  Microbiology Results  Results for orders placed or performed during the hospital encounter of 10/21/15  MRSA PCR Screening     Status: None   Collection Time: 10/21/15  6:34 AM  Result Value Ref Range Status   MRSA by PCR NEGATIVE NEGATIVE Final    Comment:        The GeneXpert MRSA Assay (FDA approved for NASAL specimens only), is one component of a comprehensive MRSA colonization surveillance program. It is  not intended to diagnose MRSA infection nor to guide or monitor treatment for MRSA infections.   Culture, blood (routine x 2)     Status: None   Collection Time: 10/21/15  8:08 AM  Result Value Ref Range Status   Specimen Description BLOOD RIGHT HAND  Final   Special Requests BOTTLES DRAWN AEROBIC AND ANAEROBIC  0.5CC  Final   Culture NO GROWTH 5 DAYS  Final   Report Status 10/26/2015 FINAL  Final  Culture, blood (routine x 2)     Status: None   Collection Time: 10/21/15  8:13 AM  Result Value Ref Range Status   Specimen Description BLOOD RIGHT WRIST  Final   Special Requests BOTTLES DRAWN AEROBIC AND ANAEROBIC  0.5CC  Final   Culture NO GROWTH 5 DAYS  Final   Report Status 10/26/2015 FINAL  Final    RADIOLOGY:  No results found.   Management plans discussed with the patient, family and they are in agreement.  CODE STATUS:     Code Status Orders        Start     Ordered   04/02/16 2057  Full code  Continuous     04/02/16 2056    Code Status History    Date Active Date Inactive Code Status Order ID Comments User Context   10/21/2015 10:27 AM 10/23/2015  8:21 PM Full Code [id number]  Adrian Saran, MD Inpatient      TOTAL TIME TAKING CARE OF THIS PATIENT: 28 minutes.    Marcus [name],  Mardi Mainland.D on 04/03/2016 at 10:53 AM  Between 7am to 6pm - Pager - [phone]  After 6pm go to [email or web address] - Therapist, nutritional Hospitalists  Office  [phone]  CC: Primary care physician; [facility] [place]

## 2016-04-03 NOTE — Progress Notes (Signed)
POST DIALYSIS ASSESSMENT 

## 2016-04-03 NOTE — Progress Notes (Signed)
Pt discharged with interpreter Trudy at bedside. Family present as well. Iv ans telemetry removed, reviewed discharge instructions and home meds, no new rx to give per MD. All questions answered. Pt verbalizes agreement to instructions. F/U appts discussed. Escorted by staff to visitors entrance.

## 2016-04-03 NOTE — Progress Notes (Signed)
[facility]  ROUNDING NOTE   Subjective:  The patient completed dialysis yesterday. He is due for dialysis again today. Hyperkalemia has been corrected. Patient reports feeling better today.   Objective:  Vital signs in last 24 hours:  Temp:  [98.1 F (36.7 C)-98.5 F (36.9 C)] 98.5 F (36.9 C) (07/29 0420) Pulse Rate:  [66-76] 71 (07/29 0420) Resp:  [11-30] 15 (07/29 0420) BP: (126-213)/(62-86) 171/67 (07/29 0420) SpO2:  [96 %-100 %] 99 % (07/29 0420) Weight:  [63.9 kg (140 lb 14.4 oz)] 63.9 kg (140 lb 14.4 oz) (07/28 2054)  Weight change:  Filed Weights   04/02/16 2054  Weight: 63.9 kg (140 lb 14.4 oz)    Intake/Output: I/O last 3 completed shifts: In: 1000 [I.V.:1000] Out: 1500 [Other:1500]   Intake/Output this shift:  No intake/output data recorded.  Physical Exam: General: No acute distress  Head: Normocephalic, atraumatic. Moist oral mucosal membranes  Eyes: Anicteric  Neck: Supple, trachea midline  Lungs:  Clear to auscultation, normal effort  Heart: S1S2 no rubs  Abdomen:  Soft, nontender, BS present  Extremities: Trace peripheral edema.  Neurologic: Nonfocal, moving all four extremities  Skin: No lesions  Access: LUE aneurysmal AVF    Basic Metabolic Panel:  Recent Labs Lab 04/02/16 1133 04/02/16 1310 04/02/16 2116  NA 132* 133* 140  K 6.7* 6.6* 3.8  CL 95* 95* 99*  CO2 26 26 32  GLUCOSE 308* 307* 102*  BUN 27* 28* 9  CREATININE 5.42* 5.66* 2.35*  CALCIUM 9.5 9.6 8.4*    Liver Function Tests:  Recent Labs Lab 04/02/16 1133 04/02/16 1310  AST 22 21  ALT 12* 11*  ALKPHOS 91 102  BILITOT 0.7 1.0  PROT 7.6 7.9  ALBUMIN 4.2 4.4   No results for input(s): LIPASE, AMYLASE in the last 168 hours. No results for input(s): AMMONIA in the last 168 hours.  CBC:  Recent Labs Lab 04/02/16 1133  WBC 10.2  NEUTROABS 7.1*  HGB 13.4  HCT 39.2*  MCV 95.9  PLT 173    Cardiac Enzymes:  Recent Labs Lab 04/02/16 1133   CKTOTAL 248    BNP: Invalid input(s): POCBNP  CBG:  Recent Labs Lab 04/02/16 2134  GLUCAP 100*    Microbiology: Results for orders placed or performed during the hospital encounter of 10/21/15  MRSA PCR Screening     Status: None   Collection Time: 10/21/15  6:34 AM  Result Value Ref Range Status   MRSA by PCR NEGATIVE NEGATIVE Final    Comment:        The GeneXpert MRSA Assay (FDA approved for NASAL specimens only), is one component of a comprehensive MRSA colonization surveillance program. It is not intended to diagnose MRSA infection nor to guide or monitor treatment for MRSA infections.   Culture, blood (routine x 2)     Status: None   Collection Time: 10/21/15  8:08 AM  Result Value Ref Range Status   Specimen Description BLOOD RIGHT HAND  Final   Special Requests BOTTLES DRAWN AEROBIC AND ANAEROBIC  0.5CC  Final   Culture NO GROWTH 5 DAYS  Final   Report Status 10/26/2015 FINAL  Final  Culture, blood (routine x 2)     Status: None   Collection Time: 10/21/15  8:13 AM  Result Value Ref Range Status   Specimen Description BLOOD RIGHT WRIST  Final   Special Requests BOTTLES DRAWN AEROBIC AND ANAEROBIC  0.5CC  Final   Culture NO GROWTH 5 DAYS  Final   Report Status 10/26/2015 FINAL  Final    Coagulation Studies: No results for input(s): LABPROT, INR in the last 72 hours.  Urinalysis: No results for input(s): COLORURINE, LABSPEC, PHURINE, GLUCOSEU, HGBUR, BILIRUBINUR, KETONESUR, PROTEINUR, UROBILINOGEN, NITRITE, LEUKOCYTESUR in the last 72 hours.  Invalid input(s): APPERANCEUR    Imaging: No results found.   Medications:     . amLODipine  10 mg Oral Daily  . aspirin EC  81 mg Oral Daily  . atorvastatin  80 mg Oral QHS  . calcium acetate  667 mg Oral TID WC  . carvedilol  25 mg Oral BID WC  . clopidogrel  75 mg Oral QHS  . docusate sodium  100 mg Oral BID  . doxazosin  1 mg Oral QHS  . enoxaparin (LOVENOX) injection  30 mg Subcutaneous Q24H  .  finasteride  5 mg Oral QHS  . FLUoxetine  40 mg Oral Daily  . gabapentin  100 mg Oral TID  . hydrALAZINE  25 mg Oral Q8H  . insulin detemir  10 Units Subcutaneous QHS  . isosorbide mononitrate  30 mg Oral Daily  . levETIRAcetam  500 mg Oral BID  . levothyroxine  100 mcg Oral QAC breakfast  . pantoprazole  40 mg Oral Daily  . sodium chloride flush  3 mL Intravenous Q12H  . sodium chloride flush  3 mL Intravenous Q12H     Assessment/ Plan:  50 y.o. male with uncontrolled diabetes mellitus type II, hypertension, hyperlipidemia, hypothyroidism, BPH, coronary artery disease status post stent.   [facility] [address] TTS  1. End stage renal disease: Patient had hemodialysis yesterday for hyperkalemia. We will proceed with an additional dialysis treatment today to put him back on his normal schedule.   2. Hyperkalemia:  Potassium down to 3.8 after dialysis yesterday. We will plan for additional dialysis treatment today as above.  3. Anemia of CKD:  Hemoglobin was 13.4 upon admission. Therefore no indication for Procrit at the moment.  4.  Secondary hyperparathyroidism: Check phosphorus with dialysis today.   LOS: 1 Almendra Loria 7/29/201710:05 AM

## 2016-04-03 NOTE — Progress Notes (Signed)
HD STARTED  

## 2016-04-12 ENCOUNTER — Inpatient Hospital Stay: Payer: Medicaid Other

## 2016-04-12 ENCOUNTER — Emergency Department: Payer: Medicaid Other

## 2016-04-12 ENCOUNTER — Encounter: Payer: Self-pay | Admitting: Emergency Medicine

## 2016-04-12 ENCOUNTER — Inpatient Hospital Stay
Admission: EM | Admit: 2016-04-12 | Discharge: 2016-04-17 | DRG: 640 | Disposition: A | Discharge status: Home / Self Care | Payer: Medicaid Other | Attending: Internal Medicine | Admitting: Internal Medicine

## 2016-04-12 DIAGNOSIS — G40909 Epilepsy, unspecified, not intractable, without status epilepticus: Secondary | ICD-10-CM | POA: Diagnosis present

## 2016-04-12 DIAGNOSIS — I158 Other secondary hypertension: Secondary | ICD-10-CM | POA: Diagnosis present

## 2016-04-12 DIAGNOSIS — Z955 Presence of coronary angioplasty implant and graft: Secondary | ICD-10-CM

## 2016-04-12 DIAGNOSIS — D631 Anemia in chronic kidney disease: Secondary | ICD-10-CM | POA: Diagnosis present

## 2016-04-12 DIAGNOSIS — E039 Hypothyroidism, unspecified: Secondary | ICD-10-CM | POA: Diagnosis present

## 2016-04-12 DIAGNOSIS — F17200 Nicotine dependence, unspecified, uncomplicated: Secondary | ICD-10-CM | POA: Diagnosis present

## 2016-04-12 DIAGNOSIS — I169 Hypertensive crisis, unspecified: Secondary | ICD-10-CM | POA: Diagnosis present

## 2016-04-12 DIAGNOSIS — E875 Hyperkalemia: Secondary | ICD-10-CM | POA: Diagnosis not present

## 2016-04-12 DIAGNOSIS — Z9889 Other specified postprocedural states: Secondary | ICD-10-CM | POA: Diagnosis not present

## 2016-04-12 DIAGNOSIS — I160 Hypertensive urgency: Secondary | ICD-10-CM

## 2016-04-12 DIAGNOSIS — I2510 Atherosclerotic heart disease of native coronary artery without angina pectoris: Secondary | ICD-10-CM | POA: Diagnosis present

## 2016-04-12 DIAGNOSIS — E785 Hyperlipidemia, unspecified: Secondary | ICD-10-CM | POA: Diagnosis present

## 2016-04-12 DIAGNOSIS — G629 Polyneuropathy, unspecified: Secondary | ICD-10-CM | POA: Diagnosis present

## 2016-04-12 DIAGNOSIS — E1122 Type 2 diabetes mellitus with diabetic chronic kidney disease: Secondary | ICD-10-CM | POA: Diagnosis present

## 2016-04-12 DIAGNOSIS — Z794 Long term (current) use of insulin: Secondary | ICD-10-CM | POA: Diagnosis not present

## 2016-04-12 DIAGNOSIS — R197 Diarrhea, unspecified: Secondary | ICD-10-CM

## 2016-04-12 DIAGNOSIS — N400 Enlarged prostate without lower urinary tract symptoms: Secondary | ICD-10-CM | POA: Diagnosis present

## 2016-04-12 DIAGNOSIS — E1165 Type 2 diabetes mellitus with hyperglycemia: Secondary | ICD-10-CM | POA: Diagnosis present

## 2016-04-12 DIAGNOSIS — R519 Headache, unspecified: Secondary | ICD-10-CM

## 2016-04-12 DIAGNOSIS — N186 End stage renal disease: Secondary | ICD-10-CM | POA: Diagnosis present

## 2016-04-12 DIAGNOSIS — N2581 Secondary hyperparathyroidism of renal origin: Secondary | ICD-10-CM | POA: Diagnosis present

## 2016-04-12 DIAGNOSIS — I5032 Chronic diastolic (congestive) heart failure: Secondary | ICD-10-CM | POA: Diagnosis present

## 2016-04-12 DIAGNOSIS — Z992 Dependence on renal dialysis: Secondary | ICD-10-CM

## 2016-04-12 DIAGNOSIS — E871 Hypo-osmolality and hyponatremia: Secondary | ICD-10-CM | POA: Diagnosis present

## 2016-04-12 DIAGNOSIS — I25810 Atherosclerosis of coronary artery bypass graft(s) without angina pectoris: Secondary | ICD-10-CM | POA: Diagnosis present

## 2016-04-12 DIAGNOSIS — G8929 Other chronic pain: Secondary | ICD-10-CM

## 2016-04-12 DIAGNOSIS — M79606 Pain in leg, unspecified: Secondary | ICD-10-CM

## 2016-04-12 DIAGNOSIS — Z7982 Long term (current) use of aspirin: Secondary | ICD-10-CM

## 2016-04-12 DIAGNOSIS — R079 Chest pain, unspecified: Secondary | ICD-10-CM

## 2016-04-12 DIAGNOSIS — Z79899 Other long term (current) drug therapy: Secondary | ICD-10-CM | POA: Diagnosis not present

## 2016-04-12 DIAGNOSIS — R51 Headache: Secondary | ICD-10-CM

## 2016-04-12 DIAGNOSIS — M25552 Pain in left hip: Secondary | ICD-10-CM

## 2016-04-12 LAB — BASIC METABOLIC PANEL
ANION GAP: 12 (ref 5–15)
BUN: 64 mg/dL — ABNORMAL HIGH (ref 6–20)
CHLORIDE: 88 mmol/L — AB (ref 101–111)
CO2: 26 mmol/L (ref 22–32)
Calcium: 9.4 mg/dL (ref 8.9–10.3)
Creatinine, Ser: 7.38 mg/dL — ABNORMAL HIGH (ref 0.61–1.24)
GFR, EST AFRICAN AMERICAN: 9 mL/min — AB (ref >60)
GFR, EST NON AFRICAN AMERICAN: 8 mL/min — AB (ref >60)
Glucose, Bld: 384 mg/dL — ABNORMAL HIGH (ref 65–99)
POTASSIUM: 7.2 mmol/L — AB (ref 3.5–5.1)
SODIUM: 126 mmol/L — AB (ref 135–145)

## 2016-04-12 LAB — TROPONIN I
Troponin I: <0.03 ng/mL (ref <0.03)
Troponin I: <0.03 ng/mL (ref <0.03)

## 2016-04-12 LAB — MRSA PCR SCREENING: MRSA by PCR: NEGATIVE

## 2016-04-12 LAB — CBC
HEMATOCRIT: 34.1 % — AB (ref 40.0–52.0)
Hemoglobin: 12.1 g/dL — ABNORMAL LOW (ref 13.0–18.0)
MCH: 32.8 pg (ref 26.0–34.0)
MCHC: 35.4 g/dL (ref 32.0–36.0)
MCV: 92.5 fL (ref 80.0–100.0)
Platelets: 204 10*3/uL (ref 150–440)
RBC: 3.68 MIL/uL — AB (ref 4.40–5.90)
RDW: 14.3 % (ref 11.5–14.5)
WBC: 10.7 10*3/uL — AB (ref 3.8–10.6)

## 2016-04-12 LAB — CK: Total CK: 368 U/L (ref 49–397)

## 2016-04-12 LAB — GLUCOSE, CAPILLARY: Glucose-Capillary: 186 mg/dL — ABNORMAL HIGH (ref 65–99)

## 2016-04-12 MED ORDER — FLUOXETINE HCL 40 MG PO CAPS: 40.0000 mg | ORAL_CAPSULE | Freq: Every day | ORAL | Status: DC

## 2016-04-12 MED ORDER — HEPARIN SODIUM (PORCINE) 5000 UNIT/ML IJ SOLN
5000.0000 [IU] | Freq: Three times a day (TID) | INTRAMUSCULAR | Status: DC
  Administered 2016-04-12 – 2016-04-17 (×14): 5000 [IU] via SUBCUTANEOUS
  Filled 2016-04-12 (×15): qty 1

## 2016-04-12 MED ORDER — PANTOPRAZOLE SODIUM 40 MG PO TBEC
40.0000 mg | DELAYED_RELEASE_TABLET | Freq: Every day | ORAL | Status: DC
  Administered 2016-04-13 – 2016-04-17 (×5): 40 mg via ORAL
  Filled 2016-04-12 (×5): qty 1

## 2016-04-12 MED ORDER — NITROGLYCERIN 0.4 MG SL SUBL
0.4000 mg | SUBLINGUAL_TABLET | SUBLINGUAL | Status: DC | PRN
  Administered 2016-04-12 (×2): 0.4 mg via SUBLINGUAL
  Filled 2016-04-12: qty 1
  Filled 2016-04-12: qty 2

## 2016-04-12 MED ORDER — ALBUTEROL SULFATE (2.5 MG/3ML) 0.083% IN NEBU
2.5000 mg | INHALATION_SOLUTION | Freq: Once | RESPIRATORY_TRACT | Status: AC
  Administered 2016-04-12: 2.5 mg via RESPIRATORY_TRACT
  Filled 2016-04-12: qty 3

## 2016-04-12 MED ORDER — ASPIRIN EC 81 MG PO TBEC
81.0000 mg | DELAYED_RELEASE_TABLET | Freq: Every day | ORAL | Status: DC
  Administered 2016-04-13 – 2016-04-17 (×5): 81 mg via ORAL
  Filled 2016-04-12 (×6): qty 1

## 2016-04-12 MED ORDER — PANTOPRAZOLE SODIUM 40 MG PO TBEC: 40.0000 mg | DELAYED_RELEASE_TABLET | Freq: Every day | ORAL | Status: DC

## 2016-04-12 MED ORDER — NITROGLYCERIN IN D5W 200-5 MCG/ML-% IV SOLN
0.0000 ug/min | INTRAVENOUS | Status: DC
  Administered 2016-04-12: 5 ug/min via INTRAVENOUS
  Administered 2016-04-14: 85 ug/min via INTRAVENOUS
  Administered 2016-04-14: 65 ug/min via INTRAVENOUS
  Administered 2016-04-15: 60 ug/min via INTRAVENOUS
  Administered 2016-04-15: 75 ug/min via INTRAVENOUS
  Administered 2016-04-16: 60 ug/min via INTRAVENOUS
  Filled 2016-04-12 (×8): qty 250

## 2016-04-12 MED ORDER — LEVETIRACETAM 500 MG PO TABS
500.0000 mg | ORAL_TABLET | Freq: Two times a day (BID) | ORAL | Status: DC
  Administered 2016-04-12 – 2016-04-17 (×10): 500 mg via ORAL
  Filled 2016-04-12 (×10): qty 1

## 2016-04-12 MED ORDER — SODIUM BICARBONATE 8.4 % IV SOLN
50.0000 meq | Freq: Once | INTRAVENOUS | Status: AC
  Administered 2016-04-12: 50 meq via INTRAVENOUS
  Filled 2016-04-12: qty 50

## 2016-04-12 MED ORDER — GABAPENTIN 100 MG PO CAPS
100.0000 mg | ORAL_CAPSULE | Freq: Every day | ORAL | Status: DC
  Administered 2016-04-12 – 2016-04-13 (×2): 100 mg via ORAL
  Filled 2016-04-12 (×2): qty 1

## 2016-04-12 MED ORDER — INSULIN ASPART 100 UNIT/ML ~~LOC~~ SOLN
10.0000 [IU] | Freq: Once | SUBCUTANEOUS | Status: AC
  Administered 2016-04-12: 10 [IU] via INTRAVENOUS
  Filled 2016-04-12: qty 10

## 2016-04-12 MED ORDER — HYDROCODONE-ACETAMINOPHEN 5-325 MG PO TABS: 1.0000 | ORAL_TABLET | ORAL | Status: DC | PRN

## 2016-04-12 MED ORDER — ACETAMINOPHEN 650 MG RE SUPP
650.0000 mg | Freq: Four times a day (QID) | RECTAL | Status: DC | PRN
Start: 2016-04-12 — End: 2016-04-14

## 2016-04-12 MED ORDER — ONDANSETRON HCL 4 MG/2ML IJ SOLN: 4.0000 mg | Freq: Four times a day (QID) | INTRAMUSCULAR | Status: DC | PRN

## 2016-04-12 MED ORDER — ONDANSETRON HCL 4 MG PO TABS: 4.0000 mg | ORAL_TABLET | Freq: Four times a day (QID) | ORAL | Status: DC | PRN

## 2016-04-12 MED ORDER — AMLODIPINE BESYLATE 10 MG PO TABS
10.0000 mg | ORAL_TABLET | Freq: Every day | ORAL | Status: DC
  Administered 2016-04-13 – 2016-04-17 (×5): 10 mg via ORAL
  Filled 2016-04-12: qty 1
  Filled 2016-04-12 (×3): qty 2
  Filled 2016-04-12: qty 1
  Filled 2016-04-12 (×2): qty 2

## 2016-04-12 MED ORDER — SODIUM CHLORIDE 0.9 % IV SOLN
1.0000 g | Freq: Once | INTRAVENOUS | Status: AC
  Administered 2016-04-12: 1 g via INTRAVENOUS
  Filled 2016-04-12: qty 10

## 2016-04-12 MED ORDER — SODIUM CHLORIDE 0.9 % IJ SOLN
1.5000 mg | INTRAVENOUS | Status: DC
  Filled 2016-04-12: qty 0.3

## 2016-04-12 MED ORDER — DOXAZOSIN MESYLATE 1 MG PO TABS
1.0000 mg | ORAL_TABLET | Freq: Every day | ORAL | Status: DC
  Administered 2016-04-12 – 2016-04-16 (×5): 1 mg via ORAL
  Filled 2016-04-12 (×6): qty 1

## 2016-04-12 MED ORDER — ATORVASTATIN CALCIUM 20 MG PO TABS
80.0000 mg | ORAL_TABLET | Freq: Every day | ORAL | Status: DC
Start: 2016-04-12 — End: 2016-04-17
  Administered 2016-04-12 – 2016-04-16 (×5): 80 mg via ORAL
  Filled 2016-04-12 (×6): qty 4

## 2016-04-12 MED ORDER — ACETAMINOPHEN 325 MG PO TABS: 650.0000 mg | ORAL_TABLET | Freq: Four times a day (QID) | ORAL | Status: DC | PRN

## 2016-04-12 MED ORDER — INSULIN ASPART 100 UNIT/ML ~~LOC~~ SOLN
0.0000 [IU] | Freq: Three times a day (TID) | SUBCUTANEOUS | Status: DC
  Administered 2016-04-13: 2 [IU] via SUBCUTANEOUS
  Administered 2016-04-13: 1 [IU] via SUBCUTANEOUS
  Administered 2016-04-13: 5 [IU] via SUBCUTANEOUS
  Administered 2016-04-14: 2 [IU] via SUBCUTANEOUS
  Administered 2016-04-14: 3 [IU] via SUBCUTANEOUS
  Administered 2016-04-14: 2 [IU] via SUBCUTANEOUS
  Administered 2016-04-15: 5 [IU] via SUBCUTANEOUS
  Administered 2016-04-15: 1 [IU] via SUBCUTANEOUS
  Administered 2016-04-16: 3 [IU] via SUBCUTANEOUS
  Administered 2016-04-16: 5 [IU] via SUBCUTANEOUS
  Administered 2016-04-17: 1 [IU] via SUBCUTANEOUS
  Filled 2016-04-12 (×2): qty 1
  Filled 2016-04-12: qty 3
  Filled 2016-04-12: qty 2
  Filled 2016-04-12: qty 1
  Filled 2016-04-12 (×2): qty 2
  Filled 2016-04-12: qty 5
  Filled 2016-04-12: qty 3
  Filled 2016-04-12 (×2): qty 5

## 2016-04-12 MED ORDER — CLOPIDOGREL BISULFATE 75 MG PO TABS
75.0000 mg | ORAL_TABLET | Freq: Every day | ORAL | Status: DC
Start: 2016-04-12 — End: 2016-04-17
  Administered 2016-04-12 – 2016-04-16 (×5): 75 mg via ORAL
  Filled 2016-04-12 (×5): qty 1

## 2016-04-12 MED ORDER — FINASTERIDE 5 MG PO TABS: 5.0000 mg | ORAL_TABLET | Freq: Every day | ORAL | Status: DC

## 2016-04-12 NOTE — H&P (Signed)
[facility] [place] [facility]   PATIENT NAME: Marcus [name]    MR#:  [id number]  DATE OF BIRTH:  1966-01-05  DATE OF ADMISSION:  04/12/2016  PRIMARY CARE PHYSICIAN: [facility] [place]   REQUESTING/REFERRING PHYSICIAN: Dr. Langston MaskerShaevitz  CHIEF COMPLAINT:  No chief complaint on file.   HISTORY OF PRESENT ILLNESS:  Marcus Chinglfredo Rabelo  is a 50 y.o. male with a known history of Essential hypertension, diabetes mellitus type 2, ESRD on hemodialysis Tuesday Thursday Saturday comes in because of leg cramps. Patient noted to have the cramps and also had a fall yesterday. Upon now lab results  Found  To severe hyperkalemia potassium of 7.9.  patient complained of chest pain since last night.. Patient complains of chest pain in the middle of the chest associated with a very elevated blood pressure of 202/109.  He received nitroglycerin in sublingual in the emergency room. Been having diarrhea, nausea, abdominal pain since yesterday. PAST MEDICAL HISTORY:   Past Medical History:  Diagnosis Date  . BPH (benign prostatic hyperplasia)   . CAD (coronary artery disease) of artery bypass graft    a. s/p multipe PCI to LCx and OMI; Last cath in 07/2014 w/ POBA to 70% stenosis in LCx   . Chronic diastolic CHF (congestive heart failure) (HCC)    a. EF 55-60% by echo in 08/2015; Grade 2 DD noted.  . Depression   . Diabetes mellitus without complication (HCC)   . Dialysis patient [facility](HCC)    a. Tue/Th/Sat Schedule  . High cholesterol   . Hypertension   . Renal disorder   . Thyroid disease     PAST SURGICAL HISTOIRY:   Past Surgical History:  Procedure Laterality Date  . CARDIAC CATHETERIZATION    . DIALYSIS FISTULA CREATION      SOCIAL HISTORY:   Social History  Substance Use Topics  . Smoking status: Current Some Day Smoker  . Smokeless tobacco: Never Used  . Alcohol use No    FAMILY HISTORY:  No family history on file.  DRUG ALLERGIES:    Allergies  Allergen Reactions  . Fish-Derived Products Rash    REVIEW OF SYSTEMS:  CONSTITUTIONAL: No fever, fatigue or weakness.  EYES: No blurred or double vision.  EARS, NOSE, AND THROAT: No tinnitus or ear pain.  RESPIRATORY: No cough, shortness of breath, wheezing or hemoptysis.  CARDIOVASCULAR: Midsternal chest pain, sweating. GASTROINTESTINAL: Nausea, vomiting, diarrhea. GENITOURINARY: No dysuria, hematuria.  ENDOCRINE: No polyuria, nocturia,  HEMATOLOGY: No anemia, easy bruising or bleeding SKIN: No rash or lesion. MUSCULOSKELETAL: Having leg cramps. NEUROLOGIC: . Leg weakness. PSYCHIATRY: No anxiety or depression.   MEDICATIONS AT HOME:   Prior to Admission medications   Medication Sig Start Date End Date Taking? Authorizing Provider  amLODipine (NORVASC) 10 MG tablet Take 10 mg by mouth daily.   Yes Historical Provider, MD  aspirin EC 81 MG tablet Take 81 mg by mouth daily.   Yes Historical Provider, MD  atorvastatin (LIPITOR) 80 MG tablet Take 80 mg by mouth at bedtime.   Yes Historical Provider, MD  calcium acetate (PHOSLO) 667 MG capsule Take 667 mg by mouth 3 (three) times daily with meals.    Yes Historical Provider, MD  carvedilol (COREG) 25 MG tablet Take 25 mg by mouth 2 (two) times daily with a meal.   Yes Historical Provider, MD  clopidogrel (PLAVIX) 75 MG tablet Take 75 mg by mouth at bedtime.   Yes Historical Provider, MD  doxazosin (CARDURA) 1 MG tablet Take 1 mg by mouth at bedtime.   Yes Historical Provider, MD  gabapentin (NEURONTIN) 100 MG capsule Take 100 mg by mouth at bedtime.   Yes Historical Provider, MD  hydrALAZINE (APRESOLINE) 25 MG tablet Take 25 mg by mouth every 8 (eight) hours.   Yes Historical Provider, MD  insulin aspart protamine- aspart (NOVOLOG MIX 70/30) (70-30) 100 UNIT/ML injection Inject 0.05 mLs (5 Units total) into the skin daily with breakfast. 10/24/15  Yes Wyatt Haste, MD  insulin aspart protamine- aspart (NOVOLOG MIX 70/30)  (70-30) 100 UNIT/ML injection Inject 0.1 mLs (10 Units total) into the skin daily with supper. 10/24/15  Yes Wyatt Haste, MD  isosorbide mononitrate (IMDUR) 120 MG 24 hr tablet Take 120 mg by mouth daily.   Yes Historical Provider, MD  isosorbide mononitrate (IMDUR) 30 MG 24 hr tablet Take 30 mg by mouth daily.   Yes Historical Provider, MD  levETIRAcetam (KEPPRA) 500 MG tablet Take 500 mg by mouth 2 (two) times daily.   Yes Historical Provider, MD  levothyroxine (SYNTHROID, LEVOTHROID) 100 MCG tablet Take 100 mcg by mouth daily before breakfast.   Yes Historical Provider, MD  lisinopril (PRINIVIL,ZESTRIL) 40 MG tablet Take 40 mg by mouth at bedtime.   Yes Historical Provider, MD  LORazepam (ATIVAN) 0.5 MG tablet Take 0.5 mg by mouth at bedtime.   Yes Historical Provider, MD  nitroGLYCERIN (NITROSTAT) 0.4 MG SL tablet Take 0.4 mg by mouth every 5 (five) minutes as needed. 10/24/12  Yes Historical Provider, MD  omeprazole (PRILOSEC) 20 MG capsule Take 40 mg by mouth daily.   Yes Historical Provider, MD  finasteride (PROSCAR) 5 MG tablet Take 5 mg by mouth at bedtime.    Historical Provider, MD  FLUoxetine (PROZAC) 40 MG capsule Take 40 mg by mouth daily.    Historical Provider, MD      VITAL SIGNS:  Blood pressure (!) 205/79, pulse 67, temperature 97.6 F (36.4 C), temperature source Oral, resp. rate 15, weight 69.3 kg (152 lb 11.2 oz), SpO2 98 %.  PHYSICAL EXAMINATION:  GENERAL:  50 y.o.-year-old patient lying in the bed with no acute distress. Chronically ill appearing. EYES: Pupils equal, round, reactive to light and accommodation. No scleral icterus. Extraocular muscles intact.  HEENT: Head atraumatic, normocephalic. Oropharynx and nasopharynx clear.  NECK:  Supple, no jugular venous distention. No thyroid enlargement, no tenderness.  LUNGS: Normal breath sounds bilaterally, no wheezing, rales,rhonchi or crepitation. No use of accessory muscles of respiration.  CARDIOVASCULAR: S1, S2  normal. No murmurs, rubs, or gallops.  ABDOMEN: Soft, nontender, nondistended. Bowel sounds present. No organomegaly or mass.  EXTREMITIES: No pedal edema, cyanosis, or clubbing.  NEUROLOGIC: Cranial nerves II through XII are intact. Muscle strength 5/5 in all extremities. Sensation intact. Gait not checked.  PSYCHIATRIC: The patient is alert and oriented x 3.  SKIN: No obvious rash, lesion, or ulcer.   LABORATORY PANEL:   CBC  Recent Labs Lab 04/12/16 1339  WBC 10.7*  HGB 12.1*  HCT 34.1*  PLT 204   ------------------------------------------------------------------------------------------------------------------  Chemistries   Recent Labs Lab 04/12/16 1339  NA 126*  K 7.2*  CL 88*  CO2 26  GLUCOSE 384*  BUN 64*  CREATININE 7.38*  CALCIUM 9.4   ------------------------------------------------------------------------------------------------------------------  Cardiac Enzymes  Recent Labs Lab 04/12/16 1339  TROPONINI <0.03   ------------------------------------------------------------------------------------------------------------------  RADIOLOGY:  No results found.  EKG:   Orders placed or performed during the hospital encounter of 04/12/16  .  EKG 12-Lead  . EKG 12-Lead   Normal sinus rhythm 63 bpm, tall T waves in lead V2, V3, V4, V5, V6. IMPRESSION AND PLAN:   [age] year old male patient with life-threatening hyperkalemia in the contest of ESRD: Patient supposed to get hemodialysis today. I requested nephrology consult to arrange for urgent hemodialysis. Patient is admitted to intensive care unit because of malignant hypertension, chest pain,, severe hyperkalemia. #2  Severe hyperkalemia: Patient received a potassium shifting measures with calcium gluconate, sodium bicarb. need for urgent hemodialysis. Recheck the potassium. Patient has EKG changes with the peak T waves from V2 to V6. #3. Malignant hypertension: Start a nitro drip. Consult cardiology  because of chest pain. 4. chest pain in a patient with history of coronary artery disease, cardiac stent. Continue aspirin, statins, beta blockers, Plavix. #4 .history of diabetes mellitus type 2: Patient having nausea, vomiting, diarrhea. so hold the his home dose insulin, use sliding scale with coverage. #5 hyponatremia  Hypervolemic;needs HD. 6,.vomiting diarrhea: Check the stool for C. difficile. all the records are reviewed and case discussed with ED provider. Used  [place] interpreter. Management plans discussed with the patient, family and they are in agreement.  CODE STATUS: full  TOTAL TIME TAKING CARE OF THIS PATIENT: 60 (CCT) minutes.    Katha Hamming M.D on 04/12/2016 at 4:54 PM  Between 7am to 6pm - Pager - [phone]  After 6pm go to [email or web address] - password [email or web address] [facility]  [place] Hospitalists  Office  [phone]  CC: Primary care physician; [facility] [place]  Note: This dictation was prepared with Dragon dictation along with smaller phrase technology. Any transcriptional errors that result from this process are unintentional.

## 2016-04-12 NOTE — ED Notes (Signed)
Interpreter at bedside , MD at bedside

## 2016-04-12 NOTE — ED Notes (Signed)
Pt states he had dialysis Saturday, pt states some CP since yesterday "feels inflammed". Pt states he has also been having diarrhea frequently denies any blood in stool. Pt A&O x4

## 2016-04-12 NOTE — ED Notes (Signed)
Medical interpreter has been used for MD exam and nurse interview. Family is at bedside.

## 2016-04-12 NOTE — Progress Notes (Signed)
PRE HD   

## 2016-04-12 NOTE — ED Provider Notes (Signed)
[facility] Emergency Department Provider Note   ____________________________________________   First MD Initiated Contact with Patient 04/12/16 1539     (approximate)  I have reviewed the triage vital signs and the nursing notes.   HISTORY  Chief Complaint No chief complaint on file.   HPI Marcus [name] is a 50 y.o. male with a history of end-stage renal disease on dialysis was present to the emergency department today with 3 out of 10 left-sided chest pain as well as lower extremity pain and several falls. He denies hitting his head when he fell. However, he does have a 7 out of 10 posterior headache at this time. Denies any neck pain. Says that his last dialysis session was this past Saturday and had a full session at that time.Says that the chest pain worsens with walking. Denies any shortness of breath. Denies any nausea or vomiting. Does not make urine.   Past Medical History:  Diagnosis Date  . BPH (benign prostatic hyperplasia)   . CAD (coronary artery disease) of artery bypass graft    a. s/p multipe PCI to LCx and OMI; Last cath in 07/2014 w/ POBA to 70% stenosis in LCx   . Chronic diastolic CHF (congestive heart failure) (HCC)    a. EF 55-60% by echo in 08/2015; Grade 2 DD noted.  . Depression   . Diabetes mellitus without complication (HCC)   . Dialysis patient [facility])    a. Tue/Th/Sat Schedule  . High cholesterol   . Hypertension   . Renal disorder   . Thyroid disease     Patient Active Problem List   Diagnosis Date Noted  . Hyperkalemia 04/02/2016  . CAD in native artery 10/22/2015  . Chronic diastolic heart failure (HCC) 10/22/2015  . Syncope 10/22/2015  . ESRD (end stage renal disease) on dialysis (HCC) 10/22/2015  . CAP (community acquired pneumonia) 10/22/2015  . Aspiration pneumonia of right lung (HCC)   . Hyperglycemia   . Syncope and collapse   . Hypoxia 10/21/2015    Past Surgical History:  Procedure Laterality  Date  . CARDIAC CATHETERIZATION    . DIALYSIS FISTULA CREATION      Prior to Admission medications   Medication Sig Start Date End Date Taking? Authorizing Provider  amLODipine (NORVASC) 10 MG tablet Take 10 mg by mouth daily.    Historical Provider, MD  aspirin EC 81 MG tablet Take 81 mg by mouth daily.    Historical Provider, MD  atorvastatin (LIPITOR) 80 MG tablet Take 80 mg by mouth at bedtime.    Historical Provider, MD  calcium acetate (PHOSLO) 667 MG capsule Take 667 mg by mouth 3 (three) times daily with meals.     Historical Provider, MD  carvedilol (COREG) 25 MG tablet Take 25 mg by mouth 2 (two) times daily with a meal.    Historical Provider, MD  clopidogrel (PLAVIX) 75 MG tablet Take 75 mg by mouth at bedtime.    Historical Provider, MD  doxazosin (CARDURA) 1 MG tablet Take 1 mg by mouth at bedtime.    Historical Provider, MD  finasteride (PROSCAR) 5 MG tablet Take 5 mg by mouth at bedtime.    Historical Provider, MD  FLUoxetine (PROZAC) 40 MG capsule Take 40 mg by mouth daily.    Historical Provider, MD  hydrALAZINE (APRESOLINE) 25 MG tablet Take 25 mg by mouth every 8 (eight) hours.    Historical Provider, MD  insulin aspart protamine- aspart (NOVOLOG MIX 70/30) (70-30) 100 UNIT/ML  injection Inject 0.05 mLs (5 Units total) into the skin daily with breakfast. 10/24/15   Wyatt Hasteavid K Hower, MD  insulin aspart protamine- aspart (NOVOLOG MIX 70/30) (70-30) 100 UNIT/ML injection Inject 0.1 mLs (10 Units total) into the skin daily with supper. 10/24/15   Wyatt Hasteavid K Hower, MD  isosorbide mononitrate (IMDUR) 120 MG 24 hr tablet Take 120 mg by mouth daily.    Historical Provider, MD  isosorbide mononitrate (IMDUR) 30 MG 24 hr tablet Take 30 mg by mouth daily.    Historical Provider, MD  levETIRAcetam (KEPPRA) 500 MG tablet Take 500 mg by mouth 2 (two) times daily.    Historical Provider, MD  levothyroxine (SYNTHROID, LEVOTHROID) 100 MCG tablet Take 100 mcg by mouth daily before breakfast.     Historical Provider, MD  linagliptin (TRADJENTA) 5 MG TABS tablet Take 5 mg by mouth at bedtime.    Historical Provider, MD  lisinopril (PRINIVIL,ZESTRIL) 40 MG tablet Take 40 mg by mouth at bedtime.    Historical Provider, MD  LORazepam (ATIVAN) 0.5 MG tablet Take 0.5 mg by mouth at bedtime.    Historical Provider, MD  nitroGLYCERIN (NITROSTAT) 0.4 MG SL tablet Take 0.4 mg by mouth every 5 (five) minutes as needed. 10/24/12   Historical Provider, MD  omeprazole (PRILOSEC) 20 MG capsule Take 40 mg by mouth daily.    Historical Provider, MD    Allergies Review of patient's allergies indicates no known allergies.  No family history on file.  Social History Social History  Substance Use Topics  . Smoking status: Current Some Day Smoker  . Smokeless tobacco: Never Used  . Alcohol use No    Review of Systems Constitutional: No fever/chills Eyes: No visual changes. ENT: No sore throat. Cardiovascular: As above Respiratory: Denies shortness of breath. Gastrointestinal: No abdominal pain.  No nausea, no vomiting. Complaining of watery diarrhea over the past 2 days.  No constipation. Genitourinary: Negative for dysuria. Musculoskeletal: Right-sided thoracic back pain which patient says is unchanged and chronic. Skin: Complaining of left groin swelling. Neurological: Negative for headaches, focal weakness or numbness.  10-point ROS otherwise negative.  ____________________________________________   PHYSICAL EXAM:  VITAL SIGNS: ED Triage Vitals  Enc Vitals Group     BP 04/12/16 1347 (!) 202/72     Pulse Rate 04/12/16 1347 65     Resp 04/12/16 1347 20     Temp 04/12/16 1347 97.6 F (36.4 C)     Temp Source 04/12/16 1347 Oral     SpO2 04/12/16 1347 97 %     Weight --      Height --      Head Circumference --      Peak Flow --      Pain Score 04/12/16 1349 10     Pain Loc --      Pain Edu? --      Excl. in GC? --     Constitutional: Alert and oriented. Well appearing and  in no acute distress. Eyes: Conjunctivae are normal. PERRL. EOMI. Head: Atraumatic. Nose: No congestion/rhinnorhea. Mouth/Throat: Mucous membranes are moist.  Neck: No stridor.   Cardiovascular: Normal rate, regular rhythm. Grossly normal heart sounds.  Good peripheral circulation with intact and equal bilateral dorsalis pedis pulses. Left upper extremity fistula with palpable thrill. Respiratory: Normal respiratory effort.  No retractions. Lungs CTAB. Gastrointestinal: Soft and nontender. No distention.  No CVA tenderness. Musculoskeletal: No lower extremity tenderness nor edema.  No joint effusions. Pelvis is stable. 5 out of 5  strength bilateral lower summary's. Able to fully range with active range of motion. Neurologic:  Normal speech and language. No gross focal neurologic deficits are appreciated.  Skin:  Left groin with several lesions consistent with folliculitis. Mild tenderness palpation with swelling but without any fluctuance, pus or erythema. Psychiatric: Mood and affect are normal. Speech and behavior are normal.  ____________________________________________   LABS (all labs ordered are listed, but only abnormal results are displayed)  Labs Reviewed  BASIC METABOLIC PANEL - Abnormal; Notable for the following:       Result Value   Sodium 126 (*)    Potassium 7.2 (*)    Chloride 88 (*)    Glucose, Bld 384 (*)    BUN 64 (*)    Creatinine, Ser 7.38 (*)    GFR calc non Af Amer 8 (*)    GFR calc Af Amer 9 (*)    All other components within normal limits  CBC - Abnormal; Notable for the following:    WBC 10.7 (*)    RBC 3.68 (*)    Hemoglobin 12.1 (*)    HCT 34.1 (*)    All other components within normal limits  TROPONIN I  CBG MONITORING, ED   ____________________________________________  EKG  ED ECG REPORT I, Arelia Longest, the attending physician, personally viewed and interpreted this ECG.   Date: 04/12/2016  EKG Time: 1338  Rate: 63  Rhythm:  normal sinus rhythm  Axis: Left axis deviation  Intervals:none  ST&T Change: Peak T waves in V2 through V4. Single T-wave inversion in aVL. T-wave inversion appears new. T-wave peaking appears increased since the EKG done on 04/02/2016.  ____________________________________________  RADIOLOGY  Pending chest x-ray ____________________________________________   PROCEDURES  Procedure(s) performed:   Procedures  Critical Care performed:  CRITICAL CARE Performed by: Arelia Longest   Total critical care time: 35 minutes  Critical care time was exclusive of separately billable procedures and treating other patients.  Critical care was necessary to treat or prevent imminent or life-threatening deterioration.  Critical care was time spent personally by me on the following activities: development of treatment plan with patient and/or surrogate as well as nursing, discussions with consultants, evaluation of patient's response to treatment, examination of patient, obtaining history from patient or surrogate, ordering and performing treatments and interventions, ordering and review of laboratory studies, ordering and review of radiographic studies, pulse oximetry and re-evaluation of patient's condition.   ____________________________________________   INITIAL IMPRESSION / ASSESSMENT AND PLAN / ED COURSE  Pertinent labs & imaging results that were available during my care of the patient were reviewed by me and considered in my medical decision making (see chart for details).  345pm Call made to renal on call for emergent dialysis.  Ordered potassium temporizing meds. Plan to admit to the hospital for emergent dialysis. Patient asked and does not want any medicines for pain at this time. Understands that he will need to be admitted for further workup.  Clinical Course  Comment By Time  Signed out to Dr. Pablo Lawrence.  Awaiting callback from nephrology. Myrna Blazer, MD  08/07 1609   ----------------------------------------- 4:41 PM on 04/12/2016 -----------------------------------------  Patient is pain-free after sublingual nitroglycerin tabs and his blood pressure is been reduced to 190s over 60s. I discussed the case with Dr. Thedore Mins of nephrology and he will be dialyzing the patient.  ____________________________________________   FINAL CLINICAL IMPRESSION(S) / ED DIAGNOSES  Chest pain. Hyperkalemia. Headache. Lower extremity pain. Hypertensive  urgency.    NEW MEDICATIONS STARTED DURING THIS VISIT:  New Prescriptions   No medications on file     Note:  This document was prepared using Dragon voice recognition software and may include unintentional dictation errors.    Myrna Blazer, MD 04/12/16 [phone]

## 2016-04-12 NOTE — ED Notes (Signed)
Patient transported to CT on cardiac monitor with RN.

## 2016-04-12 NOTE — ED Notes (Signed)
Pt denies CP at this time, third dose of nitro held at this time

## 2016-04-12 NOTE — Progress Notes (Signed)
HD start 

## 2016-04-12 NOTE — ED Notes (Signed)
[place] RN has attempted IV access X2. Medic has attempted IV access X1. Call has been made requesting assistance with Ultrasound guided access.

## 2016-04-12 NOTE — ED Triage Notes (Signed)
Pt presents with all over pain and recently falling a lot per wife. Pt with hx of stroke.

## 2016-04-12 NOTE — Progress Notes (Signed)
[facility]  ROUNDING NOTE   Subjective:  Patient presents for cold sensation in the legs Had chest pain yesterday Last HD was on Saturday No adverse events reported during dialysis In ER potassium is noted to critically high   Objective:  Vital signs in last 24 hours:  Temp:  [97.6 F (36.4 C)] 97.6 F (36.4 C) (08/07 1706) Pulse Rate:  [65-67] 67 (08/07 1600) Resp:  [14-20] 14 (08/07 1715) BP: (195-205)/(72-83) 195/83 (08/07 1715) SpO2:  [97 %-98 %] 98 % (08/07 1600) Weight:  [69.3 kg (152 lb 11.2 oz)] 69.3 kg (152 lb 11.2 oz) (08/07 1631)  Weight change:  Filed Weights   04/12/16 1631  Weight: 69.3 kg (152 lb 11.2 oz)    Intake/Output: No intake/output data recorded.   Intake/Output this shift:  No intake/output data recorded.  Physical Exam: General: No acute distress  Head: Normocephalic, atraumatic. Moist oral mucosal membranes  Eyes: Anicteric  Neck: Supple, trachea midline  Lungs:  Clear to auscultation, normal effort  Heart: S1S2 no rubs  Abdomen:  Soft, nontender, BS present  Extremities: Trace peripheral edema.  Neurologic: Nonfocal, moving all four extremities  Skin: No lesions  Access: LUE aneurysmal AVF    Basic Metabolic Panel:  Recent Labs Lab 04/12/16 1339  NA 126*  K 7.2*  CL 88*  CO2 26  GLUCOSE 384*  BUN 64*  CREATININE 7.38*  CALCIUM 9.4    Liver Function Tests: No results for input(s): AST, ALT, ALKPHOS, BILITOT, PROT, ALBUMIN in the last 168 hours. No results for input(s): LIPASE, AMYLASE in the last 168 hours. No results for input(s): AMMONIA in the last 168 hours.  CBC:  Recent Labs Lab 04/12/16 1339  WBC 10.7*  HGB 12.1*  HCT 34.1*  MCV 92.5  PLT 204    Cardiac Enzymes:  Recent Labs Lab 04/12/16 1339  CKTOTAL 368  TROPONINI <0.03    BNP: Invalid input(s): POCBNP  CBG:  Recent Labs Lab 04/12/16 1802  GLUCAP 186*    Microbiology: Results for orders placed or performed during the  hospital encounter of 10/21/15  MRSA PCR Screening     Status: None   Collection Time: 10/21/15  6:34 AM  Result Value Ref Range Status   MRSA by PCR NEGATIVE NEGATIVE Final    Comment:        The GeneXpert MRSA Assay (FDA approved for NASAL specimens only), is one component of a comprehensive MRSA colonization surveillance program. It is not intended to diagnose MRSA infection nor to guide or monitor treatment for MRSA infections.   Culture, blood (routine x 2)     Status: None   Collection Time: 10/21/15  8:08 AM  Result Value Ref Range Status   Specimen Description BLOOD RIGHT HAND  Final   Special Requests BOTTLES DRAWN AEROBIC AND ANAEROBIC  0.5CC  Final   Culture NO GROWTH 5 DAYS  Final   Report Status 10/26/2015 FINAL  Final  Culture, blood (routine x 2)     Status: None   Collection Time: 10/21/15  8:13 AM  Result Value Ref Range Status   Specimen Description BLOOD RIGHT WRIST  Final   Special Requests BOTTLES DRAWN AEROBIC AND ANAEROBIC  0.5CC  Final   Culture NO GROWTH 5 DAYS  Final   Report Status 10/26/2015 FINAL  Final    Coagulation Studies: No results for input(s): LABPROT, INR in the last 72 hours.  Urinalysis: No results for input(s): COLORURINE, LABSPEC, PHURINE, GLUCOSEU, HGBUR, BILIRUBINUR, KETONESUR,  PROTEINUR, UROBILINOGEN, NITRITE, LEUKOCYTESUR in the last 72 hours.  Invalid input(s): APPERANCEUR    Imaging: Dg Chest 1 View  Result Date: 04/12/2016 CLINICAL DATA:  Chest pain. EXAM: CHEST 1 VIEW COMPARISON:  Radiographs of October 21, 2015. FINDINGS: Stable cardiomegaly. No pneumothorax or pleural effusion is noted. No acute pulmonary disease is noted. Bony thorax is intact. IMPRESSION: No acute cardiopulmonary abnormality seen. Electronically Signed   By: Lupita Raider, M.D.   On: 04/12/2016 18:04   Ct Head Wo Contrast  Result Date: 04/12/2016 CLINICAL DATA:  50 year old male with history of trauma from a fall yesterday complaining of head pain  since the time of the fall. EXAM: CT HEAD WITHOUT CONTRAST TECHNIQUE: Contiguous axial images were obtained from the base of the skull through the vertex without intravenous contrast. COMPARISON:  Head CT 11/29/2015. FINDINGS: Physiologic calcifications in the basal ganglia. Patchy and confluent areas of decreased attenuation are noted throughout the deep and periventricular white matter of the cerebral hemispheres bilaterally, compatible with chronic microvascular ischemic disease. No acute displaced skull fractures are identified. No acute intracranial abnormality. Specifically, no evidence of acute post-traumatic intracranial hemorrhage, no definite regions of acute/subacute cerebral ischemia, no focal mass, mass effect, hydrocephalus or abnormal intra or extra-axial fluid collections. The visualized paranasal sinuses and mastoids are well pneumatized. Tiny metallic foreign body again noted in the soft tissues adjacent to the medial aspect of the right orbit (unchanged). IMPRESSION: 1. No acute displaced skull fractures or findings to suggest significant acute traumatic injury to the brain. 2. Chronic microvascular ischemic changes throughout the deep and periventricular white matter of the cerebral hemispheres bilaterally, similar to the prior study. Electronically Signed   By: Trudie Reed M.D.   On: 04/12/2016 17:00     Medications:     . [START ON 04/13/2016] amLODipine  10 mg Oral Daily  . [START ON 04/13/2016] aspirin EC  81 mg Oral Daily  . atorvastatin  80 mg Oral QHS  . clopidogrel  75 mg Oral QHS  . doxazosin  1 mg Oral QHS  . gabapentin  100 mg Oral QHS  . heparin  5,000 Units Subcutaneous Q8H  . [START ON 04/13/2016] insulin aspart  0-9 Units Subcutaneous TID WC  . levETIRAcetam  500 mg Oral BID  . nitroGLYCERIN 1.5mg /12ml(25 mcg/ml) - MC-IR  1.5 mg Intra-arterial to XRAY  . [START ON 04/13/2016] pantoprazole  40 mg Oral Daily     Assessment/ Plan:  50 y.o. male with uncontrolled  diabetes mellitus type II, hypertension, hyperlipidemia, hypothyroidism, BPH, coronary artery disease status post stent.   [facility] TTS  1. End stage renal disease: with severe hyperkalemia - Urgent HD today for severe hyperkalemia  2. Anemia of CKD:  Hemoglobin was 12.1 no indication for Procrit at the moment.  3.  Secondary hyperparathyroidism:  Monitor phosphorus with dialysis    LOS: 0 Marcus [name] 8/7/20176:19 PM

## 2016-04-12 NOTE — Progress Notes (Signed)
PRE HD ASSESSMENT 

## 2016-04-12 NOTE — ED Notes (Signed)
Pharm called for cal gluc IVP, will send down when ready

## 2016-04-13 ENCOUNTER — Inpatient Hospital Stay: Payer: Medicaid Other

## 2016-04-13 ENCOUNTER — Inpatient Hospital Stay: Admit: 2016-04-13 | Payer: Medicaid Other

## 2016-04-13 LAB — TROPONIN I
Troponin I: <0.03 ng/mL (ref <0.03)
Troponin I: <0.03 ng/mL (ref <0.03)

## 2016-04-13 LAB — BASIC METABOLIC PANEL
Anion gap: 7 (ref 5–15)
BUN: 23 mg/dL — AB (ref 6–20)
CHLORIDE: 98 mmol/L — AB (ref 101–111)
CO2: 33 mmol/L — AB (ref 22–32)
CREATININE: 4.28 mg/dL — AB (ref 0.61–1.24)
Calcium: 8.7 mg/dL — ABNORMAL LOW (ref 8.9–10.3)
GFR calc Af Amer: 17 mL/min — ABNORMAL LOW (ref >60)
GFR calc non Af Amer: 15 mL/min — ABNORMAL LOW (ref >60)
GLUCOSE: 133 mg/dL — AB (ref 65–99)
POTASSIUM: 4.3 mmol/L (ref 3.5–5.1)
Sodium: 138 mmol/L (ref 135–145)

## 2016-04-13 LAB — CBC
HEMATOCRIT: 29.9 % — AB (ref 40.0–52.0)
Hemoglobin: 10.8 g/dL — ABNORMAL LOW (ref 13.0–18.0)
MCH: 33.2 pg (ref 26.0–34.0)
MCHC: 36 g/dL (ref 32.0–36.0)
MCV: 92.3 fL (ref 80.0–100.0)
Platelets: 204 10*3/uL (ref 150–440)
RBC: 3.24 MIL/uL — ABNORMAL LOW (ref 4.40–5.90)
RDW: 14.1 % (ref 11.5–14.5)
WBC: 7.6 10*3/uL (ref 3.8–10.6)

## 2016-04-13 LAB — GLUCOSE, CAPILLARY
GLUCOSE-CAPILLARY: 257 mg/dL — AB (ref 65–99)
Glucose-Capillary: 126 mg/dL — ABNORMAL HIGH (ref 65–99)
Glucose-Capillary: 155 mg/dL — ABNORMAL HIGH (ref 65–99)

## 2016-04-13 MED ORDER — LEVOTHYROXINE SODIUM 100 MCG PO TABS
100.0000 ug | ORAL_TABLET | Freq: Every day | ORAL | Status: DC
  Administered 2016-04-14 – 2016-04-17 (×4): 100 ug via ORAL
  Filled 2016-04-13 (×4): qty 1

## 2016-04-13 MED ORDER — LABETALOL HCL 5 MG/ML IV SOLN: 200.0000 mg | Freq: Three times a day (TID) | INTRAVENOUS | Status: DC

## 2016-04-13 MED ORDER — CALCIUM ACETATE (PHOS BINDER) 667 MG PO CAPS
667.0000 mg | ORAL_CAPSULE | Freq: Three times a day (TID) | ORAL | Status: DC
  Administered 2016-04-13 – 2016-04-17 (×12): 667 mg via ORAL
  Filled 2016-04-13 (×12): qty 1

## 2016-04-13 MED ORDER — IRBESARTAN 150 MG PO TABS
300.0000 mg | ORAL_TABLET | Freq: Every day | ORAL | Status: DC
  Administered 2016-04-13 – 2016-04-16 (×4): 300 mg via ORAL
  Filled 2016-04-13 (×3): qty 4
  Filled 2016-04-13: qty 2

## 2016-04-13 MED ORDER — CETYLPYRIDINIUM CHLORIDE 0.05 % MT LIQD
7.0000 mL | Freq: Two times a day (BID) | OROMUCOSAL | Status: DC
  Administered 2016-04-13 – 2016-04-15 (×6): 7 mL via OROMUCOSAL

## 2016-04-13 MED ORDER — LABETALOL HCL 200 MG PO TABS
200.0000 mg | ORAL_TABLET | Freq: Three times a day (TID) | ORAL | Status: DC
  Administered 2016-04-13 – 2016-04-14 (×5): 200 mg via ORAL
  Filled 2016-04-13 (×8): qty 1

## 2016-04-13 MED ORDER — LORAZEPAM 0.5 MG PO TABS
0.5000 mg | ORAL_TABLET | Freq: Every day | ORAL | Status: DC
  Administered 2016-04-13 – 2016-04-16 (×4): 0.5 mg via ORAL
  Filled 2016-04-13 (×4): qty 1

## 2016-04-13 MED ORDER — LABETALOL HCL 5 MG/ML IV SOLN
10.0000 mg | INTRAVENOUS | Status: DC | PRN
  Administered 2016-04-13: 10 mg via INTRAVENOUS
  Filled 2016-04-13 (×2): qty 4

## 2016-04-13 MED ORDER — HYDRALAZINE HCL 50 MG PO TABS
50.0000 mg | ORAL_TABLET | Freq: Three times a day (TID) | ORAL | Status: DC
  Administered 2016-04-13 – 2016-04-14 (×3): 50 mg via ORAL
  Filled 2016-04-13 (×4): qty 1

## 2016-04-13 MED ORDER — HYDRALAZINE HCL 20 MG/ML IJ SOLN: 50.0000 mg | Freq: Three times a day (TID) | INTRAMUSCULAR | Status: DC

## 2016-04-13 MED ORDER — LISINOPRIL 20 MG PO TABS: 40.0000 mg | ORAL_TABLET | Freq: Every day | ORAL | Status: DC

## 2016-04-13 MED ORDER — DOCUSATE SODIUM 100 MG PO CAPS
100.0000 mg | ORAL_CAPSULE | Freq: Two times a day (BID) | ORAL | Status: DC
  Administered 2016-04-13 – 2016-04-17 (×9): 100 mg via ORAL
  Filled 2016-04-13 (×9): qty 1

## 2016-04-13 NOTE — Progress Notes (Signed)
Post dialysis 

## 2016-04-13 NOTE — Progress Notes (Signed)
POST HD VITALS 

## 2016-04-13 NOTE — Progress Notes (Signed)
Dr Coral ElseKlonidena paged and notified of patients sytolic greater than 200 sustained. Pt currently titrated on nitro gtt at 70 mcgs. An urgent cardio consult was called and a manual BP check of 220/78 was performed and will be reported to Dr Alphonsus SiasKlondina.  Dr Park BreedKahn now at bedside.

## 2016-04-13 NOTE — Progress Notes (Signed)
Dialysis complete

## 2016-04-13 NOTE — Progress Notes (Signed)
Trust Marcus [name] is Marcus [name] 50 y.o. male  [id number]  Primary Cardiologist: Adrian Blackwater Reason for Consultation: Hypertensive crisis  HPI: This is Marcus [name] 50 year old Hispanic male with Marcus [name] past medical history of hypertension presented to the hospital complaining of pain in the legs. He also complained of chest pain headache and shortness of breath. Patient no longer has chest pain at this time. Still feels that his legs are cold and his having throbbing pain in the legs.   Review of Systems: No orthopnea PND   Past Medical History:  Diagnosis Date  . BPH (benign prostatic hyperplasia)   . CAD (coronary artery disease) of artery bypass graft    Marcus [name]. s/p multipe PCI to LCx and OMI; Last cath in 07/2014 w/ POBA to 70% stenosis in LCx   . Chronic diastolic CHF (congestive heart failure) (HCC)    Marcus [name]. EF 55-60% by echo in 08/2015; Grade 2 DD noted.  . Depression   . Diabetes mellitus without complication (HCC)   . Dialysis patient [facility])    Marcus [name]. Tue/Th/Sat Schedule  . High cholesterol   . Hypertension   . Renal disorder   . Thyroid disease     Medications Prior to Admission  Medication Sig Dispense Refill  . amLODipine (NORVASC) 10 MG tablet Take 10 mg by mouth daily.    Marland Kitchen aspirin EC 81 MG tablet Take 81 mg by mouth daily.    Marland Kitchen atorvastatin (LIPITOR) 80 MG tablet Take 80 mg by mouth at bedtime.    . calcium acetate (PHOSLO) 667 MG capsule Take 667 mg by mouth 3 (three) times daily with meals.     . carvedilol (COREG) 25 MG tablet Take 25 mg by mouth 2 (two) times daily with Marcus [name] meal.    . clopidogrel (PLAVIX) 75 MG tablet Take 75 mg by mouth at bedtime.    Marland Kitchen doxazosin (CARDURA) 1 MG tablet Take 1 mg by mouth at bedtime.    . gabapentin (NEURONTIN) 100 MG capsule Take 100 mg by mouth at bedtime.    . hydrALAZINE (APRESOLINE) 25 MG tablet Take 25 mg by mouth every 8 (eight) hours.    . insulin aspart protamine- aspart (NOVOLOG MIX 70/30) (70-30) 100 UNIT/ML injection Inject 0.05 mLs (5 Units total)  into the skin daily with breakfast. 10 mL 11  . insulin aspart protamine- aspart (NOVOLOG MIX 70/30) (70-30) 100 UNIT/ML injection Inject 0.1 mLs (10 Units total) into the skin daily with supper. 10 mL 11  . isosorbide mononitrate (IMDUR) 120 MG 24 hr tablet Take 120 mg by mouth daily.    . isosorbide mononitrate (IMDUR) 30 MG 24 hr tablet Take 30 mg by mouth daily.    Marland Kitchen levETIRAcetam (KEPPRA) 500 MG tablet Take 500 mg by mouth 2 (two) times daily.    Marland Kitchen levothyroxine (SYNTHROID, LEVOTHROID) 100 MCG tablet Take 100 mcg by mouth daily before breakfast.    . lisinopril (PRINIVIL,ZESTRIL) 40 MG tablet Take 40 mg by mouth at bedtime.    Marland Kitchen LORazepam (ATIVAN) 0.5 MG tablet Take 0.5 mg by mouth at bedtime.    . nitroGLYCERIN (NITROSTAT) 0.4 MG SL tablet Take 0.4 mg by mouth every 5 (five) minutes as needed.    Marland Kitchen omeprazole (PRILOSEC) 20 MG capsule Take 40 mg by mouth daily.    . finasteride (PROSCAR) 5 MG tablet Take 5 mg by mouth at bedtime.    Marland Kitchen FLUoxetine (PROZAC) 40 MG capsule Take 40 mg by mouth daily.       Marland Kitchen  amLODipine  10 mg Oral Daily  . antiseptic oral rinse  7 mL Mouth Rinse BID  . aspirin EC  81 mg Oral Daily  . atorvastatin  80 mg Oral QHS  . clopidogrel  75 mg Oral QHS  . doxazosin  1 mg Oral QHS  . gabapentin  100 mg Oral QHS  . heparin  5,000 Units Subcutaneous Q8H  . hydrALAZINE  50 mg Oral TID  . insulin aspart  0-9 Units Subcutaneous TID WC  . labetalol  200 mg Oral TID  . levETIRAcetam  500 mg Oral BID  . pantoprazole  40 mg Oral Daily    Infusions: . nitroGLYCERIN 75 mcg/min (04/13/16 0830)    Allergies  Allergen Reactions  . Fish-Derived Products Rash    Social History   Social History  . Marital status: Married    Spouse name: N/Marcus [name]  . Number of children: N/Marcus [name]  . Years of education: N/Marcus [name]   Occupational History  . Not on file.   Social History Main Topics  . Smoking status: Current Some Day Smoker  . Smokeless tobacco: Never Used  . Alcohol use No  .  Drug use: Unknown  . Sexual activity: Not on file   Other Topics Concern  . Not on file   Social History Narrative  . No narrative on file    No family history on file.  PHYSICAL EXAM: Vitals:   04/13/16 0700 04/13/16 0800  BP: (!) 189/80 (!) 205/84  Pulse: 72 76  Resp: 15 16  Temp:       Intake/Output Summary (Last 24 hours) at 04/13/16 0910 Last data filed at 04/13/16 0700  Gross per 24 hour  Intake            186.1 ml  Output              500 ml  Net           -313.9 ml    General:  Well appearing. No respiratory difficulty HEENT: normal Neck: supple. no JVD. Carotids 2+ bilat; no bruits. No lymphadenopathy or thryomegaly appreciated. Cor: PMI nondisplaced. Regular rate & rhythm. No rubs, gallops or murmurs. Lungs: clear Abdomen: soft, nontender, nondistended. No hepatosplenomegaly. No bruits or masses. Good bowel sounds. Extremities: no cyanosis, clubbing, rash, edema Neuro: alert & oriented x 3, cranial nerves grossly intact. moves all 4 extremities w/o difficulty. Affect pleasant.  ECG: Normal sinus rhythm with early repolarization abnormalities.  Results for orders placed or performed during the hospital encounter of 04/12/16 (from the past 24 hour(s))  Basic metabolic panel     Status: Abnormal   Collection Time: 04/12/16  1:39 PM  Result Value Ref Range   Sodium 126 (L) 135 - 145 mmol/L   Potassium 7.2 (HH) 3.5 - 5.1 mmol/L   Chloride 88 (L) 101 - 111 mmol/L   CO2 26 22 - 32 mmol/L   Glucose, Bld 384 (H) 65 - 99 mg/dL   BUN 64 (H) 6 - 20 mg/dL   Creatinine, Ser 1.61 (H) 0.61 - 1.24 mg/dL   Calcium 9.4 8.9 - 09.6 mg/dL   GFR calc non Af Amer 8 (L) >60 mL/min   GFR calc Af Amer 9 (L) >60 mL/min   Anion gap 12 5 - 15  CBC     Status: Abnormal   Collection Time: 04/12/16  1:39 PM  Result Value Ref Range   WBC 10.7 (H) 3.8 - 10.6 K/uL   RBC 3.68 (L) 4.40 -  5.90 MIL/uL   Hemoglobin 12.1 (L) 13.0 - 18.0 g/dL   HCT 16.134.1 (L) 09.640.0 - 04.552.0 %   MCV 92.5 80.0  - 100.0 fL   MCH 32.8 26.0 - 34.0 pg   MCHC 35.4 32.0 - 36.0 g/dL   RDW 40.914.3 81.111.5 - 91.414.5 %   Platelets 204 150 - 440 K/uL  Troponin I     Status: None   Collection Time: 04/12/16  1:39 PM  Result Value Ref Range   Troponin I <0.03 <0.03 ng/mL  CK     Status: None   Collection Time: 04/12/16  1:39 PM  Result Value Ref Range   Total CK 368 49 - 397 U/L  Troponin I     Status: None   Collection Time: 04/12/16  6:01 PM  Result Value Ref Range   Troponin I <0.03 <0.03 ng/mL  Glucose, capillary     Status: Abnormal   Collection Time: 04/12/16  6:02 PM  Result Value Ref Range   Glucose-Capillary 186 (H) 65 - 99 mg/dL  MRSA PCR Screening     Status: None   Collection Time: 04/12/16  6:05 PM  Result Value Ref Range   MRSA by PCR NEGATIVE NEGATIVE  Troponin I     Status: None   Collection Time: 04/13/16 12:18 AM  Result Value Ref Range   Troponin I <0.03 <0.03 ng/mL  Basic metabolic panel     Status: Abnormal   Collection Time: 04/13/16  6:06 AM  Result Value Ref Range   Sodium 138 135 - 145 mmol/L   Potassium 4.3 3.5 - 5.1 mmol/L   Chloride 98 (L) 101 - 111 mmol/L   CO2 33 (H) 22 - 32 mmol/L   Glucose, Bld 133 (H) 65 - 99 mg/dL   BUN 23 (H) 6 - 20 mg/dL   Creatinine, Ser 7.824.28 (H) 0.61 - 1.24 mg/dL   Calcium 8.7 (L) 8.9 - 10.3 mg/dL   GFR calc non Af Amer 15 (L) >60 mL/min   GFR calc Af Amer 17 (L) >60 mL/min   Anion gap 7 5 - 15  CBC     Status: Abnormal   Collection Time: 04/13/16  6:06 AM  Result Value Ref Range   WBC 7.6 3.8 - 10.6 K/uL   RBC 3.24 (L) 4.40 - 5.90 MIL/uL   Hemoglobin 10.8 (L) 13.0 - 18.0 g/dL   HCT 95.629.9 (L) 21.340.0 - 08.652.0 %   MCV 92.3 80.0 - 100.0 fL   MCH 33.2 26.0 - 34.0 pg   MCHC 36.0 32.0 - 36.0 g/dL   RDW 57.814.1 46.911.5 - 62.914.5 %   Platelets 204 150 - 440 K/uL  Troponin I     Status: None   Collection Time: 04/13/16  6:06 AM  Result Value Ref Range   Troponin I <0.03 <0.03 ng/mL  Glucose, capillary     Status: Abnormal   Collection Time: 04/13/16   7:09 AM  Result Value Ref Range   Glucose-Capillary 126 (H) 65 - 99 mg/dL   Dg Chest 1 View  Result Date: 04/12/2016 CLINICAL DATA:  Chest pain. EXAM: CHEST 1 VIEW COMPARISON:  Radiographs of October 21, 2015. FINDINGS: Stable cardiomegaly. No pneumothorax or pleural effusion is noted. No acute pulmonary disease is noted. Bony thorax is intact. IMPRESSION: No acute cardiopulmonary abnormality seen. Electronically Signed   By: Lupita RaiderJames  Green Jr, M.D.   On: 04/12/2016 18:04   Ct Head Wo Contrast  Result Date: 04/12/2016 CLINICAL DATA:  50 year old  male with history of trauma from Marcus [name] fall yesterday complaining of head pain since the time of the fall. EXAM: CT HEAD WITHOUT CONTRAST TECHNIQUE: Contiguous axial images were obtained from the base of the skull through the vertex without intravenous contrast. COMPARISON:  Head CT 11/29/2015. FINDINGS: Physiologic calcifications in the basal ganglia. Patchy and confluent areas of decreased attenuation are noted throughout the deep and periventricular white matter of the cerebral hemispheres bilaterally, compatible with chronic microvascular ischemic disease. No acute displaced skull fractures are identified. No acute intracranial abnormality. Specifically, no evidence of acute post-traumatic intracranial hemorrhage, no definite regions of acute/subacute cerebral ischemia, no focal mass, mass effect, hydrocephalus or abnormal intra or extra-axial fluid collections. The visualized paranasal sinuses and mastoids are well pneumatized. Tiny metallic foreign body again noted in the soft tissues adjacent to the medial aspect of the right orbit (unchanged). IMPRESSION: 1. No acute displaced skull fractures or findings to suggest significant acute traumatic injury to the brain. 2. Chronic microvascular ischemic changes throughout the deep and periventricular white matter of the cerebral hemispheres bilaterally, similar to the prior study. Electronically Signed   By: Trudie Reed M.D.   On: 04/12/2016 17:00     ASSESSMENT AND PLAN: Atypical chest pain with myocardial infarction ruled out. Patient is on 81 mics of IV nitroglycerin and stable blood pressure systolic is over 161. Patient appears to have hypertensive crisis. Advise starting the patient on labetalol 200 3 times Marcus [name] day and hydralazine 50 mg by mouth 3 times Marcus [name] day. Also will get an echocardiogram to look at his ejection fraction.  Marcus Marcus [name]

## 2016-04-13 NOTE — Progress Notes (Signed)
Pre Dialysis 

## 2016-04-13 NOTE — Progress Notes (Signed)
Dialysis started 

## 2016-04-13 NOTE — Progress Notes (Signed)
[facility]  ROUNDING NOTE   Subjective:  Patient underwent HD yesterday. 0.5 kg was removed BP is high severely elevated today. No chest pain at present. Dr Welton FlakesKhan from cardiology is following Patient is on NTG drip Potassium corrected after HD C/o left hip pain No nausea or vomiting reported  Objective:  Vital signs in last 24 hours:  Temp:  [97.6 F (36.4 C)-98 F (36.7 C)] 97.9 F (36.6 C) (08/08 0101) Pulse Rate:  [65-82] 76 (08/08 0800) Resp:  [11-24] 16 (08/08 0800) BP: (155-208)/(57-129) 205/84 (08/08 0800) SpO2:  [89 %-100 %] 100 % (08/08 0800) Weight:  [67 kg (147 lb 11.3 oz)-70 kg (154 lb 5.2 oz)] 70 kg (154 lb 5.2 oz) (08/08 0533)  Weight change:  Filed Weights   04/12/16 1800 04/12/16 2200 04/13/16 0533  Weight: 67 kg (147 lb 11.3 oz) 67.6 kg (149 lb 0.5 oz) 70 kg (154 lb 5.2 oz)    Intake/Output: I/O last 3 completed shifts: In: 186.1 [P.O.:120; I.V.:66.1] Out: 500 [Other:500]   Intake/Output this shift:  No intake/output data recorded.  Physical Exam: General: No acute distress  Head: Normocephalic, atraumatic. Moist oral mucosal membranes  Eyes: Anicteric  Neck: Supple, trachea midline  Lungs:  Mild crackles at bases, normal effort  Heart: S1S2 no rubs  Abdomen:  Soft, nontender, BS present  Extremities: Trace peripheral edema.  Neurologic: Nonfocal, moving all four extremities  Skin: No lesions  Access: LUE aneurysmal AVF    Basic Metabolic Panel:  Recent Labs Lab 04/12/16 1339 04/13/16 0606  NA 126* 138  K 7.2* 4.3  CL 88* 98*  CO2 26 33*  GLUCOSE 384* 133*  BUN 64* 23*  CREATININE 7.38* 4.28*  CALCIUM 9.4 8.7*    Liver Function Tests: No results for input(s): AST, ALT, ALKPHOS, BILITOT, PROT, ALBUMIN in the last 168 hours. No results for input(s): LIPASE, AMYLASE in the last 168 hours. No results for input(s): AMMONIA in the last 168 hours.  CBC:  Recent Labs Lab 04/12/16 1339 04/13/16 0606  WBC 10.7* 7.6   HGB 12.1* 10.8*  HCT 34.1* 29.9*  MCV 92.5 92.3  PLT 204 204    Cardiac Enzymes:  Recent Labs Lab 04/12/16 1339 04/12/16 1801 04/13/16 0018 04/13/16 0606  CKTOTAL 368  --   --   --   TROPONINI <0.03 <0.03 <0.03 <0.03    BNP: Invalid input(s): POCBNP  CBG:  Recent Labs Lab 04/12/16 1802 04/13/16 0709  GLUCAP 186* 126*    Microbiology: Results for orders placed or performed during the hospital encounter of 04/12/16  MRSA PCR Screening     Status: None   Collection Time: 04/12/16  6:05 PM  Result Value Ref Range Status   MRSA by PCR NEGATIVE NEGATIVE Final    Comment:        The GeneXpert MRSA Assay (FDA approved for NASAL specimens only), is one component of a comprehensive MRSA colonization surveillance program. It is not intended to diagnose MRSA infection nor to guide or monitor treatment for MRSA infections.     Coagulation Studies: No results for input(s): LABPROT, INR in the last 72 hours.  Urinalysis: No results for input(s): COLORURINE, LABSPEC, PHURINE, GLUCOSEU, HGBUR, BILIRUBINUR, KETONESUR, PROTEINUR, UROBILINOGEN, NITRITE, LEUKOCYTESUR in the last 72 hours.  Invalid input(s): APPERANCEUR    Imaging: Dg Chest 1 View  Result Date: 04/12/2016 CLINICAL DATA:  Chest pain. EXAM: CHEST 1 VIEW COMPARISON:  Radiographs of October 21, 2015. FINDINGS: Stable cardiomegaly. No pneumothorax or pleural  effusion is noted. No acute pulmonary disease is noted. Bony thorax is intact. IMPRESSION: No acute cardiopulmonary abnormality seen. Electronically Signed   By: Lupita Raider, M.D.   On: 04/12/2016 18:04   Ct Head Wo Contrast  Result Date: 04/12/2016 CLINICAL DATA:  50 year old male with history of trauma from a fall yesterday complaining of head pain since the time of the fall. EXAM: CT HEAD WITHOUT CONTRAST TECHNIQUE: Contiguous axial images were obtained from the base of the skull through the vertex without intravenous contrast. COMPARISON:  Head CT  11/29/2015. FINDINGS: Physiologic calcifications in the basal ganglia. Patchy and confluent areas of decreased attenuation are noted throughout the deep and periventricular white matter of the cerebral hemispheres bilaterally, compatible with chronic microvascular ischemic disease. No acute displaced skull fractures are identified. No acute intracranial abnormality. Specifically, no evidence of acute post-traumatic intracranial hemorrhage, no definite regions of acute/subacute cerebral ischemia, no focal mass, mass effect, hydrocephalus or abnormal intra or extra-axial fluid collections. The visualized paranasal sinuses and mastoids are well pneumatized. Tiny metallic foreign body again noted in the soft tissues adjacent to the medial aspect of the right orbit (unchanged). IMPRESSION: 1. No acute displaced skull fractures or findings to suggest significant acute traumatic injury to the brain. 2. Chronic microvascular ischemic changes throughout the deep and periventricular white matter of the cerebral hemispheres bilaterally, similar to the prior study. Electronically Signed   By: Trudie Reed M.D.   On: 04/12/2016 17:00     Medications:   . nitroGLYCERIN 75 mcg/min (04/13/16 0830)   . amLODipine  10 mg Oral Daily  . antiseptic oral rinse  7 mL Mouth Rinse BID  . aspirin EC  81 mg Oral Daily  . atorvastatin  80 mg Oral QHS  . clopidogrel  75 mg Oral QHS  . doxazosin  1 mg Oral QHS  . gabapentin  100 mg Oral QHS  . heparin  5,000 Units Subcutaneous Q8H  . hydrALAZINE  50 mg Oral TID  . insulin aspart  0-9 Units Subcutaneous TID WC  . labetalol  200 mg Oral TID  . levETIRAcetam  500 mg Oral BID  . pantoprazole  40 mg Oral Daily     Assessment/ Plan:  50 y.o. male with uncontrolled diabetes mellitus type II, hypertension, hyperlipidemia, hypothyroidism, BPH, coronary artery disease status post stent.   [facility] [address] TTS  1. End stage renal disease: with severe  hyperkalemia - s/p Urgent HD for severe hyperkalemia 8/7.  - K level corrected now  2. Anemia of CKD:  Hemoglobin 10.8 Will start procrit once BP is better controlled  3.  Secondary hyperparathyroidism:  Monitor phosphorus with dialysis   4. Severe HTN - patient is on NTG drip - start ARB - irbesartan - volume removal with HD is also expected to help   LOS: 1 Marcus [name] 8/8/20179:19 AM

## 2016-04-13 NOTE — Progress Notes (Signed)
[facility]  at [facility]   PATIENT NAME: Marcus [name]    MR#:  [id number]  DATE OF BIRTH:  08-24-1966  SUBJECTIVE: Patient feels better today. No nausea or vomiting. No diarrhea. No chest pain. BP is better. IV nitro drip. Still on IV nitrate 85 mics per minute   CHIEF COMPLAINT:  No chief complaint on file.   REVIEW OF SYSTEMS:    Review of Systems  Constitutional: Negative for chills and fever.  HENT: Negative for hearing loss and tinnitus.   Eyes: Negative for blurred vision and double vision.  Respiratory: Negative for cough and hemoptysis.   Cardiovascular: Negative for chest pain and palpitations.  Gastrointestinal: Negative for heartburn and nausea.  Genitourinary: Negative for dysuria and urgency.  Musculoskeletal: Negative for myalgias and neck pain.  Skin: Negative for itching and rash.  Neurological: Negative for dizziness, tingling and headaches.  Endo/Heme/Allergies: Negative for environmental allergies. Does not bruise/bleed easily.  Psychiatric/Behavioral: Negative for depression, substance abuse and suicidal ideas.    Nutrition:  Tolerating Diet: Tolerating PT:      DRUG ALLERGIES:   Allergies  Allergen Reactions  . Fish-Derived Products Rash    VITALS:  Blood pressure (!) 139/94, pulse 76, temperature 98.1 F (36.7 C), temperature source Oral, resp. rate (!) 21, height 5\' 4"  (1.626 m), weight 70 kg (154 lb 5.2 oz), SpO2 100 %.  PHYSICAL EXAMINATION:   Physical Exam  GENERAL:  50 y.o.-year-old patient lying in the bed with no acute distress.  EYES: Pupils equal, round, reactive to light and accommodation. No scleral icterus. Extraocular muscles intact.  HEENT: Head atraumatic, normocephalic. Oropharynx and nasopharynx clear.  NECK:  Supple, no jugular venous distention. No thyroid enlargement, no tenderness.  LUNGS: Normal breath sounds bilaterally, no wheezing, rales,rhonchi or crepitation. No use of  accessory muscles of respiration.  CARDIOVASCULAR: S1, S2 normal. No murmurs, rubs, or gallops.  ABDOMEN: Soft, nontender, nondistended. Bowel sounds present. No organomegaly or mass.  EXTREMITIES: No pedal edema, cyanosis, or clubbing.  NEUROLOGIC: Cranial nerves II through XII are intact. Muscle strength 5/5 in all extremities. Sensation intact. Gait not checked.  PSYCHIATRIC: The patient is alert and oriented x 3.  SKIN: No obvious rash, lesion, or ulcer.    LABORATORY PANEL:   CBC  Recent Labs Lab 04/13/16 0606  WBC 7.6  HGB 10.8*  HCT 29.9*  PLT 204   ------------------------------------------------------------------------------------------------------------------  Chemistries   Recent Labs Lab 04/13/16 0606  NA 138  K 4.3  CL 98*  CO2 33*  GLUCOSE 133*  BUN 23*  CREATININE 4.28*  CALCIUM 8.7*   ------------------------------------------------------------------------------------------------------------------  Cardiac Enzymes  Recent Labs Lab 04/13/16 0606  TROPONINI <0.03   ------------------------------------------------------------------------------------------------------------------  RADIOLOGY:  Dg Chest 1 View  Result Date: 04/12/2016 CLINICAL DATA:  Chest pain. EXAM: CHEST 1 VIEW COMPARISON:  Radiographs of October 21, 2015. FINDINGS: Stable cardiomegaly. No pneumothorax or pleural effusion is noted. No acute pulmonary disease is noted. Bony thorax is intact. IMPRESSION: No acute cardiopulmonary abnormality seen. Electronically Signed   By: Lupita RaiderJames  Green Jr, M.D.   On: 04/12/2016 18:04   Ct Head Wo Contrast  Result Date: 04/12/2016 CLINICAL DATA:  50 year old male with history of trauma from a fall yesterday complaining of head pain since the time of the fall. EXAM: CT HEAD WITHOUT CONTRAST TECHNIQUE: Contiguous axial images were obtained from the base of the skull through the vertex without intravenous contrast. COMPARISON:  Head CT 11/29/2015.  FINDINGS: Physiologic calcifications in  the basal ganglia. Patchy and confluent areas of decreased attenuation are noted throughout the deep and periventricular white matter of the cerebral hemispheres bilaterally, compatible with chronic microvascular ischemic disease. No acute displaced skull fractures are identified. No acute intracranial abnormality. Specifically, no evidence of acute post-traumatic intracranial hemorrhage, no definite regions of acute/subacute cerebral ischemia, no focal mass, mass effect, hydrocephalus or abnormal intra or extra-axial fluid collections. The visualized paranasal sinuses and mastoids are well pneumatized. Tiny metallic foreign body again noted in the soft tissues adjacent to the medial aspect of the right orbit (unchanged). IMPRESSION: 1. No acute displaced skull fractures or findings to suggest significant acute traumatic injury to the brain. 2. Chronic microvascular ischemic changes throughout the deep and periventricular white matter of the cerebral hemispheres bilaterally, similar to the prior study. Electronically Signed   By: Trudie Reed M.D.   On: 04/12/2016 17:00     ASSESSMENT AND PLAN:   Active Problems:   Hyperkalemia   1. severe hyperkalemia: Improved with urgent hemodialysis, calcium gluconate, bicarbonate. Patient improved from 7.9-4.3. #2 malignant hypertension still on IV nitro drip, seen by cardiology started on labetalol, hydralazine. Blood pressure is improving from 200/110-165/ 80. #3 3.chest pain in a patient with coronary artery disease. Troponins are negative. Appreciate cardiology following the patient. Continue aspirin, Plavix, statins., Beta blockers. Diabetes mellitus type 2; continue SSI coverage, NovoLog 70/30 to be restarted once her by mouth intake is better. : #4 history of seizure disorder: Continue Keppra   can be Transfer out of ICU  If  patient is off the nitro drip. D/w pt. All the records are reviewed and case  discussed with Care Management/Social Workerr. Management plans discussed with the patient, family and they are in agreement.  CODE STATUS: full  TOTAL TIME TAKING CARE OF THIS PATIENT: 35(CCT) minutes.   POSSIBLE D/C IN 2-3 DAYS, DEPENDING ON CLINICAL CONDITION.   Katha Hamming M.D on 04/13/2016 at 1:45 PM  Between 7am to 6pm - Pager - [phone]  After 6pm go to [email or web address] - password [email or web address] [facility]  [place]  [facility]  Office  [phone]  CC: Primary care physician; [facility] AT [facility] [place]

## 2016-04-13 NOTE — Care Management (Signed)
I have requested outpatient dialysis status from dialysis liaison Ivor ReiningKim Riddle. Patient does not have health insurance including medicaid. Selena BattenKim is checking as of 1300 today.

## 2016-04-13 NOTE — Progress Notes (Signed)
Patient requested to speak to the nurse through an interpreter around 1000.  Patient proceeded stated that his left lower abdomen had a small mass that was causing him "itching."  Patient also stated that that his bilateral lower extremities felt that they were  "swelling and cold."  Upon assessment the bilateral lower extremities were warm to touch, no swelling was present and pulses were equal and +2.  Patient proceeded to tell me that he has been on dialysis for 5 years and is "tired." He wanted to know if he would be healed, and if not he didn't want to continue.  Patient also made a complaint that last night when he had to have emergent dialysis, he had to wait 3 hours. Patient's seemed to be upset and somewhat depressed.The patient's nephrologist Dr. Thedore MinsSingh was notified of the patient requesting to possibly discontinue dialysis.  He came to consult and patient decided to continue with dialysis. After talking the doctor the patient expressed feeling like he had more hope.  Patient family came to visit and the patient seemed happier.

## 2016-04-13 NOTE — Progress Notes (Signed)
POST HD ASSESSMENT 

## 2016-04-13 NOTE — Progress Notes (Signed)
PT Cancellation Note  Patient Details Name: Marcus [name] MRN: [id number] DOB: 1966/05/09   Cancelled Treatment:    Reason Eval/Treat Not Completed: Medical issues which prohibited therapy;Other (comment) Consult received. Pt not appropriate for PT at this time due to elevated BP (209/86, on nitro drip) and pending R Hip X-ray for possible fracture. PT will re-attempt at next date if pt is medically stable.   Thereasa ParkinShagun Elexis Pollak 04/13/2016, 10:54 AM Thereasa ParkinShagun Cele Mote, SPT [phone]

## 2016-04-13 NOTE — Progress Notes (Signed)
END OF HD 

## 2016-04-14 ENCOUNTER — Inpatient Hospital Stay
Admit: 2016-04-14 | Discharge: 2016-04-14 | Disposition: A | Discharge status: Home / Self Care | Payer: Medicaid Other | Attending: Cardiovascular Disease | Admitting: Cardiovascular Disease

## 2016-04-14 LAB — ECHOCARDIOGRAM COMPLETE
Height: 64 in
WEIGHTICAEL: 2310.42 [oz_av]

## 2016-04-14 LAB — CBC
HCT: 29.2 % — ABNORMAL LOW (ref 40.0–52.0)
HEMOGLOBIN: 10.7 g/dL — AB (ref 13.0–18.0)
MCH: 34 pg (ref 26.0–34.0)
MCHC: 36.5 g/dL — ABNORMAL HIGH (ref 32.0–36.0)
MCV: 93.2 fL (ref 80.0–100.0)
PLATELETS: 188 10*3/uL (ref 150–440)
RBC: 3.14 MIL/uL — AB (ref 4.40–5.90)
RDW: 14.4 % (ref 11.5–14.5)
WBC: 10 10*3/uL (ref 3.8–10.6)

## 2016-04-14 LAB — GLUCOSE, CAPILLARY
GLUCOSE-CAPILLARY: 211 mg/dL — AB (ref 65–99)
GLUCOSE-CAPILLARY: 193 mg/dL — AB (ref 65–99)
GLUCOSE-CAPILLARY: 181 mg/dL — AB (ref 65–99)
GLUCOSE-CAPILLARY: 236 mg/dL — AB (ref 65–99)
Glucose-Capillary: 269 mg/dL — ABNORMAL HIGH (ref 65–99)

## 2016-04-14 MED ORDER — GABAPENTIN 300 MG PO CAPS
300.0000 mg | ORAL_CAPSULE | Freq: Every day | ORAL | Status: DC
  Administered 2016-04-14 – 2016-04-16 (×3): 300 mg via ORAL
  Filled 2016-04-14 (×4): qty 1

## 2016-04-14 MED ORDER — CLONIDINE HCL 0.1 MG PO TABS
0.1000 mg | ORAL_TABLET | Freq: Two times a day (BID) | ORAL | Status: DC
  Administered 2016-04-14 (×2): 0.1 mg via ORAL
  Filled 2016-04-14 (×3): qty 1

## 2016-04-14 MED ORDER — INSULIN DETEMIR 100 UNIT/ML ~~LOC~~ SOLN
5.0000 [IU] | Freq: Every day | SUBCUTANEOUS | Status: DC
  Administered 2016-04-14 – 2016-04-16 (×3): 5 [IU] via SUBCUTANEOUS
  Filled 2016-04-14 (×4): qty 0.05

## 2016-04-14 MED ORDER — HYDRALAZINE HCL 50 MG PO TABS
100.0000 mg | ORAL_TABLET | Freq: Three times a day (TID) | ORAL | Status: DC
  Administered 2016-04-14 (×2): 100 mg via ORAL
  Filled 2016-04-14 (×2): qty 2

## 2016-04-14 NOTE — Evaluation (Signed)
Physical Therapy Evaluation Patient Details Name: Council Munguia MRN: [id number] DOB: 1966/05/18 Today's Date: 04/14/2016   History of Present Illness  Pt was admitted with hypokalemia and has a Hx of hyperglygemia, CAP, ESRD, chronic diastolic heart failure, CAD, and syncope  Clinical Impression  Audiel Scheiber is a pleasant spanish speaking 50 y/o male. He is cooperative and eager to participate in PT. He is generally independent with bed mobility, requires min assist with all other functional mobility with close guarding to prevent LOB X 2. He has decreased safety awareness with ambulation and required verbal and tactile cues to avoid running into things and for safe use of DME. Strength is generally 4-/5 for UEs and 4-/5 in LEs. Pt reported decreased sensation in his LEs, but reported that it was better after ambulating and doing seated ther-ex. Pt is appropriate for skilled PT at this time to address deficits in strength, balance, coordination, endurance, gait, safety awareness, and safe use of DME.       Follow Up Recommendations Outpatient PT    Equipment Recommendations  Rolling walker with 5" wheels    Recommendations for Other Services       Precautions / Restrictions Precautions Precautions: Fall Precaution Comments: had R posterior LOB with RW and assist X 1      Mobility  Bed Mobility Overal bed mobility: Independent             General bed mobility comments: Pt has good strength and coordination and moved to EOB with little effort with PT supervision    Transfers Overall transfer level: Needs assistance Equipment used: Rolling walker (2 wheeled) Transfers: Sit to/from Stand Sit to Stand: Min guard         General transfer comment: Pt needed only CGA due to some decreased sensation in feet.  Ambulation/Gait Ambulation/Gait assistance: Min assist;+2 safety/equipment Ambulation Distance (Feet): 150 Feet Assistive device: Rolling walker (2  wheeled) Gait Pattern/deviations: Step-through pattern;Decreased stride length;Wide base of support;Drifts right/left     General Gait Details: Pt initially reported decreased sensation in feet and LEs up to his knees. He requires min assist only for equipment and for posterior right LOB X 2. Pt drifts R/L and has decreased safety awareness; needed cuing to avoid bumping into objects with RW. Pt reported his sensation had improved after walking and ankle pumps.   Stairs            Wheelchair Mobility    Modified Rankin (Stroke Patients Only)       Balance Overall balance assessment: Needs assistance Sitting-balance support: Feet supported;No upper extremity supported Sitting balance-Leahy Scale: Good Sitting balance - Comments: Independent   Standing balance support: Bilateral upper extremity supported Standing balance-Leahy Scale: Fair Standing balance comment: Due to decreased sensation and general deconditioning pt required B UE support for standing balance with initial posterior lean that was quickly corrected with tactile cuing.                 High Level Balance Comments: Pt marched in place X 5 with RW and CGA with good control and no LOB before ambulating              Pertinent Vitals/Pain Pain Assessment: No/denies pain    Home Living Family/patient expects to be discharged to:: Private residence Living Arrangements: Spouse/significant other Available Help at Discharge: Family Type of Home: House Home Access:  (pt did not specify)              Prior Function  Level of Independence: Independent         Comments: Pt reports he was very active with his kids and with work as a Merchandiser, retailplumber     Hand Dominance        Extremity/Trunk Assessment   Upper Extremity Assessment: Generalized weakness (4+/5)           Lower Extremity Assessment: Generalized weakness (4-/5)      Cervical / Trunk Assessment: Normal  Communication    Communication: Prefers language other than English;Interpreter utilized Hydrologist(Jackie utilized as Equities traderinterpreter)  Cognition Arousal/Alertness: Awake/alert Behavior During Therapy: WFL for tasks assessed/performed Overall Cognitive Status: Within Functional Limits for tasks assessed                      General Comments      Exercises General Exercises - Lower Extremity Ankle Circles/Pumps: 10 reps;Both;Strengthening;Seated (min assist with verbal/tactile cuing) Short Arc Quad: 10 reps;Both;Strengthening;Seated (min assist with verbal/tactile cuing) Hip ABduction/ADduction: 10 reps;Both;Strengthening;Seated (min assist with verbal/tactile cuing) Hip Flexion/Marching: 5 reps;Both;Standing (min assist with verbal/tactile cuing)      Assessment/Plan    PT Assessment Patient needs continued PT services  PT Diagnosis Difficulty walking;Abnormality of gait;Generalized weakness   PT Problem List Decreased strength;Decreased range of motion;Decreased activity tolerance;Decreased balance;Decreased mobility;Decreased coordination;Decreased knowledge of use of DME;Decreased safety awareness  PT Treatment Interventions DME instruction;Gait training;Stair training;Functional mobility training;Therapeutic activities;Therapeutic exercise;Balance training;Neuromuscular re-education;Cognitive remediation (Cognative training for safety awareness and safe use of DME)   PT Goals (Current goals can be found in the Care Plan section) Acute Rehab PT Goals Patient Stated Goal: go home, walk more/ get out of bed PT Goal Formulation: With patient Time For Goal Achievement: 04/28/16 Potential to Achieve Goals: Good    Frequency Min 2X/week   Barriers to discharge        Co-evaluation               End of Session Equipment Utilized During Treatment: Gait belt Activity Tolerance: Patient tolerated treatment well (Reported he wanted to walk further) Patient left: in chair;with call bell/phone within  reach;with chair alarm set;with nursing/sitter in room Nurse Communication: Mobility status         Time: [id number] PT Time Calculation (min) (ACUTE ONLY): 30 min   Charges:   PT Evaluation $PT Eval Moderate Complexity: 1 Procedure PT Treatments $Therapeutic Exercise: 8-22 mins   PT G Codes:        Hira Trent 04/14/2016, 12:28 PM  Cassell Smilesevan M Jacqulin Brandenburger, SPT [phone]

## 2016-04-14 NOTE — Progress Notes (Signed)
[facility]  ROUNDING NOTE   Subjective:  Patient underwent HD yesterday. 2.5 kg was removed BP is somewhat better controlled today. No chest pain at present.  Dr Welton Flakes from cardiology is following Patient is on NTG drip Potassium corrected after HD C/o left hip pain No nausea or vomiting reported C/o feet being cold, gait imbalance  Objective:  Vital signs in last 24 hours:  Temp:  [98 F (36.7 C)-98.8 F (37.1 C)] 98.8 F (37.1 C) (08/09 0800) Pulse Rate:  [71-82] 75 (08/09 0500) Resp:  [10-27] 12 (08/09 0500) BP: (106-211)/(52-166) 180/53 (08/09 0500) SpO2:  [94 %-100 %] 94 % (08/09 0500) Weight:  [65.5 kg (144 lb 6.4 oz)-68.5 kg (151 lb 0.2 oz)] 65.5 kg (144 lb 6.4 oz) (08/09 0336)  Weight change: -0.764 kg (-1 lb 11 oz) Filed Weights   04/13/16 1530 04/13/16 1840 04/14/16 0336  Weight: 68.5 kg (151 lb 0.2 oz) 66 kg (145 lb 8.1 oz) 65.5 kg (144 lb 6.4 oz)    Intake/Output: I/O last 3 completed shifts: In: 1701.1 [P.O.:1320; I.V.:381.1] Out: 3000 [Other:3000]   Intake/Output this shift:  No intake/output data recorded.  Physical Exam: General: No acute distress  Head: Normocephalic, atraumatic. Moist oral mucosal membranes  Eyes: Anicteric  Neck: Supple, trachea midline  Lungs:  Clear today, normal effort  Heart: S1S2 no rubs  Abdomen:  Soft, nontender, BS present  Extremities: Trace peripheral edema.  Neurologic: Nonfocal, moving all four extremities  Skin: No lesions  Access: LUE aneurysmal AVF    Basic Metabolic Panel:  Recent Labs Lab 04/12/16 1339 04/13/16 0606  NA 126* 138  K 7.2* 4.3  CL 88* 98*  CO2 26 33*  GLUCOSE 384* 133*  BUN 64* 23*  CREATININE 7.38* 4.28*  CALCIUM 9.4 8.7*    Liver Function Tests: No results for input(s): AST, ALT, ALKPHOS, BILITOT, PROT, ALBUMIN in the last 168 hours. No results for input(s): LIPASE, AMYLASE in the last 168 hours. No results for input(s): AMMONIA in the last 168  hours.  CBC:  Recent Labs Lab 04/12/16 1339 04/13/16 0606 04/14/16 0412  WBC 10.7* 7.6 10.0  HGB 12.1* 10.8* 10.7*  HCT 34.1* 29.9* 29.2*  MCV 92.5 92.3 93.2  PLT 204 204 188    Cardiac Enzymes:  Recent Labs Lab 04/12/16 1339 04/12/16 1801 04/13/16 0018 04/13/16 0606  CKTOTAL 368  --   --   --   TROPONINI <0.03 <0.03 <0.03 <0.03    BNP: Invalid input(s): POCBNP  CBG:  Recent Labs Lab 04/13/16 0709 04/13/16 1105 04/13/16 1803 04/13/16 2153 04/14/16 0741  GLUCAP 126* 257* 155* 211* 193*    Microbiology: Results for orders placed or performed during the hospital encounter of 04/12/16  MRSA PCR Screening     Status: None   Collection Time: 04/12/16  6:05 PM  Result Value Ref Range Status   MRSA by PCR NEGATIVE NEGATIVE Final    Comment:        The GeneXpert MRSA Assay (FDA approved for NASAL specimens only), is one component of a comprehensive MRSA colonization surveillance program. It is not intended to diagnose MRSA infection nor to guide or monitor treatment for MRSA infections.     Coagulation Studies: No results for input(s): LABPROT, INR in the last 72 hours.  Urinalysis: No results for input(s): COLORURINE, LABSPEC, PHURINE, GLUCOSEU, HGBUR, BILIRUBINUR, KETONESUR, PROTEINUR, UROBILINOGEN, NITRITE, LEUKOCYTESUR in the last 72 hours.  Invalid input(s): APPERANCEUR    Imaging: Dg Chest 1 View  Result Date: 04/12/2016 CLINICAL DATA:  Chest pain. EXAM: CHEST 1 VIEW COMPARISON:  Radiographs of October 21, 2015. FINDINGS: Stable cardiomegaly. No pneumothorax or pleural effusion is noted. No acute pulmonary disease is noted. Bony thorax is intact. IMPRESSION: No acute cardiopulmonary abnormality seen. Electronically Signed   By: Lupita RaiderJames  Green Jr, M.D.   On: 04/12/2016 18:04   Ct Head Wo Contrast  Result Date: 04/12/2016 CLINICAL DATA:  50 year old male with history of trauma from a fall yesterday complaining of head pain since the time of the  fall. EXAM: CT HEAD WITHOUT CONTRAST TECHNIQUE: Contiguous axial images were obtained from the base of the skull through the vertex without intravenous contrast. COMPARISON:  Head CT 11/29/2015. FINDINGS: Physiologic calcifications in the basal ganglia. Patchy and confluent areas of decreased attenuation are noted throughout the deep and periventricular white matter of the cerebral hemispheres bilaterally, compatible with chronic microvascular ischemic disease. No acute displaced skull fractures are identified. No acute intracranial abnormality. Specifically, no evidence of acute post-traumatic intracranial hemorrhage, no definite regions of acute/subacute cerebral ischemia, no focal mass, mass effect, hydrocephalus or abnormal intra or extra-axial fluid collections. The visualized paranasal sinuses and mastoids are well pneumatized. Tiny metallic foreign body again noted in the soft tissues adjacent to the medial aspect of the right orbit (unchanged). IMPRESSION: 1. No acute displaced skull fractures or findings to suggest significant acute traumatic injury to the brain. 2. Chronic microvascular ischemic changes throughout the deep and periventricular white matter of the cerebral hemispheres bilaterally, similar to the prior study. Electronically Signed   By: Trudie Reedaniel  Entrikin M.D.   On: 04/12/2016 17:00   Dg Hip Unilat With Pelvis 2-3 Views Left  Result Date: 04/13/2016 CLINICAL DATA:  Left hip pain EXAM: DG HIP (WITH OR WITHOUT PELVIS) 2-3V LEFT COMPARISON:  Multiple exams, including 11/29/2015 FINDINGS: Vascular calcifications observed. Bony demineralization. Mild chondral thinning in both hips. Mild spurring of both acetabula. Minimal spurring of both femoral heads, unchanged. No discrete cortical discontinuity to suggest a left hip fracture identified. IMPRESSION: 1. No fracture or acute bony findings identified. 2. Diffuse bony demineralization.  Mild osteoarthritis of both hips. 3. Vascular calcifications.  Electronically Signed   By: Gaylyn RongWalter  Liebkemann M.D.   On: 04/13/2016 14:35     Medications:   . nitroGLYCERIN 70 mcg/min (04/14/16 0200)   . amLODipine  10 mg Oral Daily  . antiseptic oral rinse  7 mL Mouth Rinse BID  . aspirin EC  81 mg Oral Daily  . atorvastatin  80 mg Oral QHS  . calcium acetate  667 mg Oral TID WC  . clopidogrel  75 mg Oral QHS  . docusate sodium  100 mg Oral BID  . doxazosin  1 mg Oral QHS  . gabapentin  300 mg Oral QHS  . heparin  5,000 Units Subcutaneous Q8H  . hydrALAZINE  50 mg Oral TID  . insulin aspart  0-9 Units Subcutaneous TID WC  . irbesartan  300 mg Oral QPC supper  . labetalol  200 mg Oral TID  . levETIRAcetam  500 mg Oral BID  . levothyroxine  100 mcg Oral QAC breakfast  . LORazepam  0.5 mg Oral QHS  . pantoprazole  40 mg Oral Daily     Assessment/ Plan:  50 y.o. male with uncontrolled diabetes mellitus type II, hypertension, hyperlipidemia, hypothyroidism, BPH, coronary artery disease status post stent.   [facility]  1. End stage renal disease: with severe hyperkalemia - s/p Urgent  HD for severe hyperkalemia 8/7.  - K level corrected now - Next HD tomorrow   2. Anemia of CKD:  Hemoglobin 10.7 Will start procrit once BP is better controlled  3.  Secondary hyperparathyroidism:  Monitor phosphorus with dialysis   4. Severe HTN - patient is on NTG drip - continue irbesartan - add clonidine  5. Peripheral neuropathy - increased gabapentin to 300 qhs     LOS: 2 Starlette Thurow 8/9/20179:14 AM

## 2016-04-14 NOTE — Care Management (Signed)
Patient is self-pay without PT benefit. He may benefit from [facility] referral but RNCM will need to follow patient's progression. Front-wheeled rolling walker also recommended by PT.

## 2016-04-14 NOTE — Progress Notes (Signed)
Mr. Marcus [name] is doing well this morning, currently up with physical therapy. To the interpreter he denies chest pain or shortness of breath but does complain of numbness of the feet. His blood pressure is improved on labetalol and hydralazine. He received hemodialysis last night for severe hyperkalemia.

## 2016-04-14 NOTE — Progress Notes (Signed)
[facility] [place] [facility]   PATIENT NAME: Marcus [name]    MR#:  [id number]  DATE OF BIRTH:  02-20-1966  SUBJECTIVE: bp is still high,on 65 mcg f nitro drip.when weaned of bp rises,so still needing iv nitro drip.spoke with the help of translator,wife had some qns about post discharge diet ,meds.she says he drinks orange juice everyday.  CHIEF COMPLAINT:  No chief complaint on file.   REVIEW OF SYSTEMS:    Review of Systems  Constitutional: Negative for chills and fever.  HENT: Negative for hearing loss and tinnitus.   Eyes: Negative for blurred vision, double vision and photophobia.  Respiratory: Negative for cough, hemoptysis and shortness of breath.   Cardiovascular: Negative for chest pain, palpitations, orthopnea and leg swelling.  Gastrointestinal: Negative for abdominal pain, diarrhea, heartburn, nausea and vomiting.  Genitourinary: Negative for dysuria and urgency.  Musculoskeletal: Negative for myalgias and neck pain.  Skin: Negative for itching and rash.  Neurological: Negative for dizziness, tingling, focal weakness, seizures, weakness and headaches.  Endo/Heme/Allergies: Negative for environmental allergies. Does not bruise/bleed easily.  Psychiatric/Behavioral: Negative for depression, memory loss, substance abuse and suicidal ideas. The patient does not have insomnia.     Nutrition:  Tolerating Diet: Tolerating PT:      DRUG ALLERGIES:   Allergies  Allergen Reactions  . Fish-Derived Products Rash    VITALS:  Blood pressure (!) 178/161, pulse 70, temperature 98.9 F (37.2 C), temperature source Oral, resp. rate 16, height 5\' 4"  (1.626 m), weight 65.5 kg (144 lb 6.4 oz), SpO2 99 %.  PHYSICAL EXAMINATION:   Physical Exam  GENERAL:  50 y.o.-year-old patient lying in the bed with no acute distress.  EYES: Pupils equal, round, reactive to light and accommodation. No scleral icterus. Extraocular muscles intact.  HEENT:  Head atraumatic, normocephalic. Oropharynx and nasopharynx clear.  NECK:  Supple, no jugular venous distention. No thyroid enlargement, no tenderness.  LUNGS: Normal breath sounds bilaterally, no wheezing, rales,rhonchi or crepitation. No use of accessory muscles of respiration.  CARDIOVASCULAR: S1, S2 normal. No murmurs, rubs, or gallops.  ABDOMEN: Soft, nontender, nondistended. Bowel sounds present. No organomegaly or mass.  EXTREMITIES: No pedal edema, cyanosis, or clubbing.  NEUROLOGIC: Cranial nerves II through XII are intact. Muscle strength 5/5 in all extremities. Sensation intact. Gait not checked.  PSYCHIATRIC: The patient is alert and oriented x 3.  SKIN: No obvious rash, lesion, or ulcer.    LABORATORY PANEL:   CBC  Recent Labs Lab 04/14/16 0412  WBC 10.0  HGB 10.7*  HCT 29.2*  PLT 188   ------------------------------------------------------------------------------------------------------------------  Chemistries   Recent Labs Lab 04/13/16 0606  NA 138  K 4.3  CL 98*  CO2 33*  GLUCOSE 133*  BUN 23*  CREATININE 4.28*  CALCIUM 8.7*   ------------------------------------------------------------------------------------------------------------------  Cardiac Enzymes  Recent Labs Lab 04/13/16 0606  TROPONINI <0.03   ------------------------------------------------------------------------------------------------------------------  RADIOLOGY:  Dg Chest 1 View  Result Date: 04/12/2016 CLINICAL DATA:  Chest pain. EXAM: CHEST 1 VIEW COMPARISON:  Radiographs of October 21, 2015. FINDINGS: Stable cardiomegaly. No pneumothorax or pleural effusion is noted. No acute pulmonary disease is noted. Bony thorax is intact. IMPRESSION: No acute cardiopulmonary abnormality seen. Electronically Signed   By: Lupita RaiderJames  Green Jr, M.D.   On: 04/12/2016 18:04   Ct Head Wo Contrast  Result Date: 04/12/2016 CLINICAL DATA:  50 year old male with history of trauma from a fall yesterday  complaining of head pain since the time of the fall.  EXAM: CT HEAD WITHOUT CONTRAST TECHNIQUE: Contiguous axial images were obtained from the base of the skull through the vertex without intravenous contrast. COMPARISON:  Head CT 11/29/2015. FINDINGS: Physiologic calcifications in the basal ganglia. Patchy and confluent areas of decreased attenuation are noted throughout the deep and periventricular white matter of the cerebral hemispheres bilaterally, compatible with chronic microvascular ischemic disease. No acute displaced skull fractures are identified. No acute intracranial abnormality. Specifically, no evidence of acute post-traumatic intracranial hemorrhage, no definite regions of acute/subacute cerebral ischemia, no focal mass, mass effect, hydrocephalus or abnormal intra or extra-axial fluid collections. The visualized paranasal sinuses and mastoids are well pneumatized. Tiny metallic foreign body again noted in the soft tissues adjacent to the medial aspect of the right orbit (unchanged). IMPRESSION: 1. No acute displaced skull fractures or findings to suggest significant acute traumatic injury to the brain. 2. Chronic microvascular ischemic changes throughout the deep and periventricular white matter of the cerebral hemispheres bilaterally, similar to the prior study. Electronically Signed   By: Trudie Reed M.D.   On: 04/12/2016 17:00   Dg Hip Unilat With Pelvis 2-3 Views Left  Result Date: 04/13/2016 CLINICAL DATA:  Left hip pain EXAM: DG HIP (WITH OR WITHOUT PELVIS) 2-3V LEFT COMPARISON:  Multiple exams, including 11/29/2015 FINDINGS: Vascular calcifications observed. Bony demineralization. Mild chondral thinning in both hips. Mild spurring of both acetabula. Minimal spurring of both femoral heads, unchanged. No discrete cortical discontinuity to suggest a left hip fracture identified. IMPRESSION: 1. No fracture or acute bony findings identified. 2. Diffuse bony demineralization.  Mild  osteoarthritis of both hips. 3. Vascular calcifications. Electronically Signed   By: Gaylyn Rong M.D.   On: 04/13/2016 14:35     ASSESSMENT AND PLAN:   Active Problems:   Hyperkalemia   1. severe hyperkalemia: Improved with urgent hemodialysis, calcium gluconate, bicarbonate. Patient improved from 7.9-4.3. #2 malignant hypertension still on IV nitro drip, seen by cardiology started on labetalol, hydralazine. Blood pressure is improving from 200/110-165/ 80 Adjusted hydralazine dose ,continue labetalol Avoid Ace and potasium sparing diuretic  . #3 3.chest pain in a patient with coronary artery disease. Troponins are negative. Appreciate cardiology following the patient. Continue aspirin, Plavix, statins., Beta blockers. Chest pain;resolved  Diabetes mellitus type 2; continue SSI coverage, ,lantus.started on lantus. . : #4 history of seizure disorder: Continue Keppra   can be Transfer out of ICU  If  patient is off the nitro drip. D/w pt. All the records are reviewed and case discussed with Care Management/Social Workerr. Management plans discussed with the patient, family and they are in agreement.  CODE STATUS: full  TOTAL TIME TAKING CARE OF THIS PATIENT: 35(CCT) minutes.   POSSIBLE D/C IN 2-3 DAYS, DEPENDING ON CLINICAL CONDITION.   Katha Hamming M.D on 04/14/2016 at 3:44 PM  Between 7am to 6pm - Pager - [phone]  After 6pm go to [email or web address] - password [email or web address] [facility]  [place] [facility]  Office  [phone]  CC: Primary care physician; [facility]

## 2016-04-14 NOTE — Care Management (Signed)
No updates on outpatient dialysis center by Ivor ReiningKim Riddle dialysis liaison. [facility] [phone] fax [phone] T, Th, Sat- chair time 0600 AM. He is in good standing with them with attendance and financial.

## 2016-04-14 NOTE — Progress Notes (Signed)
Inpatient Diabetes Program Recommendations  AACE/ADA: New Consensus Statement on Inpatient Glycemic Control (2015)  Target Ranges:  Prepandial:   less than 140 mg/dL      Peak postprandial:   less than 180 mg/dL (1-2 hours)      Critically ill patients:  140 - 180 mg/dL   Results for Marcus [name], Marcus [name] (MRN [id number]) as of 04/14/2016 11:08  Ref. Range 04/13/2016 07:09 04/13/2016 11:05 04/13/2016 18:03 04/13/2016 21:53 04/14/2016 07:41  Glucose-Capillary Latest Ref Range: 65 - 99 mg/dL 409126 (H) 811257 (H) 914155 (H) 211 (H) 193 (H)   Review of Glycemic Control  Outpatient Diabetes medications: 70/30 5 units with breakfast, 70/30 10 units with supper Current orders for Inpatient glycemic control: Novolog 0-9 units TID with meals  Inpatient Diabetes Program Recommendations: Insulin - Basal: Please consider ordering low dose basal insulin. Recommend ordering Levemir 5 units QHS. Correction (SSI): Please consider ordering Novolog bedtime correction scale. HgbA1C: Please consider ordering an A1C to evaluate glycemic control over the past 2-3 months.  Thanks, Orlando PennerMarie Sianne Tejada, RN, MSN, CDE Diabetes Coordinator Inpatient Diabetes Program [phone] (Team Pager from 8am to 5pm) [phone] ([facility] office) [phone] [facility] office) [phone] [facility] office)

## 2016-04-14 NOTE — Progress Notes (Signed)
  SUBJECTIVE: Pt seen and examined with assistance of interpreter. Pt denies chest pain, difficulty breathing or headache.    Vitals:   04/14/16 1400 04/14/16 1415 04/14/16 1430 04/14/16 1445  BP: (!) 149/59 (!) 157/68 (!) 159/70 (!) 178/161  Pulse: 65 63 62 70  Resp: (!) 9 12 (!) 0 16  Temp:      TempSrc:      SpO2: 98% 98% 96% 99%  Weight:      Height:        Intake/Output Summary (Last 24 hours) at 04/14/16 1521 Last data filed at 04/13/16 2100  Gross per 24 hour  Intake           795.03 ml  Output             2500 ml  Net         -1704.97 ml    LABS: Basic Metabolic Panel:  Recent Labs  40/98/1107/03/23 1339 04/13/16 0606  NA 126* 138  K 7.2* 4.3  CL 88* 98*  CO2 26 33*  GLUCOSE 384* 133*  BUN 64* 23*  CREATININE 7.38* 4.28*  CALCIUM 9.4 8.7*   Liver Function Tests: No results for input(s): AST, ALT, ALKPHOS, BILITOT, PROT, ALBUMIN in the last 72 hours. No results for input(s): LIPASE, AMYLASE in the last 72 hours. CBC:  Recent Labs  04/13/16 0606 04/14/16 0412  WBC 7.6 10.0  HGB 10.8* 10.7*  HCT 29.9* 29.2*  MCV 92.3 93.2  PLT 204 188   Cardiac Enzymes:  Recent Labs  04/12/16 1339 04/12/16 1801 04/13/16 0018 04/13/16 0606  CKTOTAL 368  --   --   --   TROPONINI <0.03 <0.03 <0.03 <0.03   BNP: Invalid input(s): POCBNP D-Dimer: No results for input(s): DDIMER in the last 72 hours. Hemoglobin A1C: No results for input(s): HGBA1C in the last 72 hours. Fasting Lipid Panel: No results for input(s): CHOL, HDL, LDLCALC, TRIG, CHOLHDL, LDLDIRECT in the last 72 hours. Thyroid Function Tests: No results for input(s): TSH, T4TOTAL, T3FREE, THYROIDAB in the last 72 hours.  Invalid input(s): FREET3 Anemia Panel: No results for input(s): VITAMINB12, FOLATE, FERRITIN, TIBC, IRON, RETICCTPCT in the last 72 hours.   PHYSICAL EXAM General: Well developed, well nourished, in no acute distress HEENT:  Normocephalic and atramatic Neck:  No JVD.  Lungs: Clear  bilaterally to auscultation and percussion. Heart: HRRR . Normal S1 and S2 without gallops or murmurs.  Abdomen: Bowel sounds are positive, abdomen soft and non-tender  Msk:  Back normal, normal gait. Normal strength and tone for age. Extremities: No clubbing, cyanosis or edema.   Neuro: Alert and oriented X 3. Psych:  Good affect, responds appropriately  TELEMETRY: NSR 67 bpm  ASSESSMENT AND PLAN: Atypical chest pain with MI ruled out. No further chest pain. Accelerated hypertension. BP is still not optimally controlled and still on Nitroglycerin drip. Will increase hydralazine to 100 mg tid. IF this is not sufficient after few doses, may need to increase labetalol. Echo showed normal LVSF with EF 65%, but has severe LVH probably related to hypertension.  Continuing to work on blood pressure control.  Active Problems:   Hyperkalemia    Marcus BonJanine Nadra Hritz, NP 04/14/2016 3:21 PM

## 2016-04-14 NOTE — Progress Notes (Signed)
*  PRELIMINARY RESULTS* Echocardiogram 2D Echocardiogram has been performed.  Marcus [name], Marcus [name] 04/14/2016, 11:18 AM

## 2016-04-14 NOTE — Plan of Care (Signed)
Problem: Food- and Nutrition-Related Knowledge Deficit (NB-1.1) Goal: Nutrition education Formal process to instruct or train a patient/client in a skill or to impart knowledge to help patients/clients voluntarily manage or modify food choices and eating behavior to maintain or improve health. Outcome: Progressing Nutrition Education Note  RD consulted for Low Potassium Diet/Renal Education. Provided "CKD stage V Nutrition Therapy" and  "High Potassium Food List" in Spanish to patient/family. Interpretor present during education. Reviewed food groups and provided written recommended serving sizes specifically determined for patient's current nutritional status.   Explained why diet restrictions are needed and reviewed list of foods to limit/avoid that are high potassium.  Provided specific recommendations on safer alternatives of these foods. Strongly encouraged compliance of this diet. Encouraged pt to discuss specific diet questions/concerns with RD at HD outpatient facility. Teach back method used. Pt not able to identify high potassium foods post education, further education recommended.   Expect fair compliance.  Body mass index is 24.79 kg/m.   Current diet order is Renal, patient is consuming approximately 80-100% of meals at this time. Pt reports good appetite prior to admission, eating at least 3 meals per day. Labs and medications reviewed.  RD contact information provided. Will attempt to follow-up and provide further education prior to discharge. Otherwise, no further nutritional issues identified.   Romelle Starcherate Jasiri Hanawalt MS, RD, LDN [phone] Pager  [phone] Weekend/On-Call Pager

## 2016-04-15 LAB — HEMOGLOBIN A1C: Hgb A1c MFr Bld: 9.1 % — ABNORMAL HIGH (ref 4.0–6.0)

## 2016-04-15 LAB — GLUCOSE, CAPILLARY
Glucose-Capillary: 136 mg/dL — ABNORMAL HIGH (ref 65–99)
Glucose-Capillary: 234 mg/dL — ABNORMAL HIGH (ref 65–99)
Glucose-Capillary: 65 mg/dL (ref 65–99)
Glucose-Capillary: 92 mg/dL (ref 65–99)
Glucose-Capillary: 177 mg/dL — ABNORMAL HIGH (ref 65–99)

## 2016-04-15 MED ORDER — LABETALOL HCL 100 MG PO TABS
300.0000 mg | ORAL_TABLET | Freq: Two times a day (BID) | ORAL | Status: DC
  Administered 2016-04-15 – 2016-04-17 (×4): 300 mg via ORAL
  Filled 2016-04-15: qty 2
  Filled 2016-04-15: qty 1
  Filled 2016-04-15: qty 3
  Filled 2016-04-15 (×3): qty 1
  Filled 2016-04-15: qty 3

## 2016-04-15 MED ORDER — CLONIDINE HCL 0.1 MG PO TABS
0.2000 mg | ORAL_TABLET | Freq: Two times a day (BID) | ORAL | Status: DC
  Administered 2016-04-15: 0.2 mg via ORAL
  Filled 2016-04-15: qty 2

## 2016-04-15 MED ORDER — HYDRALAZINE HCL 50 MG PO TABS
100.0000 mg | ORAL_TABLET | Freq: Four times a day (QID) | ORAL | Status: DC
  Administered 2016-04-15 – 2016-04-17 (×9): 100 mg via ORAL
  Filled 2016-04-15 (×9): qty 2

## 2016-04-15 MED ORDER — MIDAZOLAM HCL 5 MG/5ML IJ SOLN
INTRAMUSCULAR | Status: AC
  Filled 2016-04-15: qty 5

## 2016-04-15 MED ORDER — FENTANYL CITRATE (PF) 100 MCG/2ML IJ SOLN
INTRAMUSCULAR | Status: AC
  Filled 2016-04-15: qty 4

## 2016-04-15 MED ORDER — LABETALOL HCL 200 MG PO TABS
300.0000 mg | ORAL_TABLET | Freq: Three times a day (TID) | ORAL | Status: DC
  Administered 2016-04-15: 300 mg via ORAL
  Filled 2016-04-15: qty 2

## 2016-04-15 NOTE — Progress Notes (Signed)
Inpatient Diabetes Program Recommendations  AACE/ADA: New Consensus Statement on Inpatient Glycemic Control (2015)  Target Ranges:  Prepandial:   less than 140 mg/dL      Peak postprandial:   less than 180 mg/dL (1-2 hours)      Critically ill patients:  140 - 180 mg/dL   Lab Results  Component Value Date   GLUCAP 234 (H) 04/15/2016   HGBA1C 9.8 (H) 10/22/2015    Review of Glycemic Control   Results for Marcus [name], Idris (MRN [id number]) as of 04/15/2016 12:02  Ref. Range 04/14/2016 11:44 04/14/2016 16:20 04/14/2016 21:20 04/15/2016 07:41 04/15/2016 11:01  Glucose-Capillary Latest Ref Range: 65 - 99 mg/dL 409181 (H) 811236 (H) 914269 (H) 136 (H) 234 (H)     Outpatient Diabetes medications: 70/30 5 units with breakfast, 70/30 10 units with supper Current orders for Inpatient glycemic control: Novolog 0-9 units TID with meals, Levemir 5 units qhs  Inpatient Diabetes Program Recommendations:  Post pranidal blood sugars remain elevated- consider adding Novolog 2 units tid with meals- continue Novolog correction as ordered.  Susette RacerJulie Wyolene Weimann, RN, BA, MHA, CDE Diabetes Coordinator Inpatient Diabetes Program  [phone] (Team Pager) [phone] [facility] Office) 04/15/2016 12:01 PM

## 2016-04-15 NOTE — Progress Notes (Signed)
HD START 

## 2016-04-15 NOTE — Progress Notes (Signed)
Physical Therapy Treatment Patient Details Name: Marcus [name] MRN: [id number] DOB: June 11, 1966 Today's Date: 04/15/2016    History of Present Illness Pt was admitted with hypokalemia and has a Hx of hyperglygemia, CAP, ESRD, chronic diastolic heart failure, CAD, and syncope    PT Comments    Pt is progressing towards his goals. He appeared to be in good spirits today and showed improvement with functional mobility. He is independent with bed mobility and transfers with RW; had +2 assist with ambulation X150' to manage equip, but could safely ambulate with +1 min assist. His gait has improved, but pt still requires RW to avoid imminent LOB and fall. Deficits are more evident on L LE with decrease stride length and ground clearance; improves with verbal cuing. PT session was ended early due to HD. Pt was instructed to do ankle pumps and SAQ 2 X 10/hour independently in supine. Pt is appropriate for continued PT at this time to address deficits in strength, balance, coordination, endurance, gait, safety awareness, and safe use of DME.     Follow Up Recommendations  Outpatient PT     Equipment Recommendations  Rolling walker with 5" wheels    Recommendations for Other Services       Precautions / Restrictions Precautions Precautions: Fall Restrictions Weight Bearing Restrictions: No    Mobility  Bed Mobility Overal bed mobility: Independent             General bed mobility comments: Pt has good strength and coordination and moved to EOB with little effort with PT supervision    Transfers Overall transfer level: Modified independent Equipment used: Rolling walker (2 wheeled) Transfers: Sit to/from Stand Sit to Stand: Supervision         General transfer comment: With RW pt is independent with transfers  Ambulation/Gait Ambulation/Gait assistance: Min assist;+2 safety/equipment Ambulation Distance (Feet): 150 Feet Assistive device: Rolling walker (2 wheeled) Gait  Pattern/deviations: Step-through pattern;Decreased step length - left Gait velocity: improved   General Gait Details: Pt has improved gait with improved safety awareness; he has decreased stide length and decreased ground clearance on L, seemingly due to inattention and decreased strength on L. Gait is reciprocal; pt still required RW to prevent LOB. Pt was able to improve gait mechanics with verbal cuing   Stairs            Wheelchair Mobility    Modified Rankin (Stroke Patients Only)       Balance Overall balance assessment: Needs assistance Sitting-balance support: Feet supported Sitting balance-Leahy Scale: Good Sitting balance - Comments: Independent   Standing balance support: Bilateral upper extremity supported Standing balance-Leahy Scale: Fair Standing balance comment: Due to decreased sensation and general deconditioning pt required B UE support for standing balance. Propioception at great toe was intact                    Cognition Arousal/Alertness: Awake/alert Behavior During Therapy: WFL for tasks assessed/performed Overall Cognitive Status: Within Functional Limits for tasks assessed                      Exercises      General Comments        Pertinent Vitals/Pain Pain Assessment: No/denies pain    Home Living                      Prior Function            PT Goals (current goals  can now be found in the care plan section) Acute Rehab PT Goals Patient Stated Goal: go home, walk more/ get out of bed PT Goal Formulation: With patient Time For Goal Achievement: 04/28/16 Potential to Achieve Goals: Good Progress towards PT goals: Progressing toward goals    Frequency  Min 2X/week    PT Plan Current plan remains appropriate    Co-evaluation             End of Session Equipment Utilized During Treatment: Gait belt Activity Tolerance: Patient tolerated treatment well Patient left: with call bell/phone within  reach;in bed;with bed alarm set;with nursing/sitter in room;with family/visitor present     Time: [id number] PT Time Calculation (min) (ACUTE ONLY): 10 min  Charges:                       G Codes:      Elektra Wartman 04/15/2016, 11:49 AM  Cassell Smilesevan M Cavin Longman, SPT [phone]

## 2016-04-15 NOTE — Progress Notes (Signed)
END of hd

## 2016-04-15 NOTE — Progress Notes (Signed)
PRE HD INFO 

## 2016-04-15 NOTE — Progress Notes (Signed)
POST HD ASSESSMENT 

## 2016-04-15 NOTE — Progress Notes (Signed)
SUBJECTIVE: No chest pain   Vitals:   04/15/16 0430 04/15/16 0500 04/15/16 0600 04/15/16 0700  BP:  (!) 141/53 (!) 104/93 136/61  Pulse:  69 67 67  Resp:  11 (!) 7 (!) 7  Temp:      TempSrc:      SpO2:  96% 96% 99%  Weight: 154 lb 5.2 oz (70 kg)     Height:        Intake/Output Summary (Last 24 hours) at 04/15/16 0824 Last data filed at 04/15/16 0600  Gross per 24 hour  Intake           751.96 ml  Output                0 ml  Net           751.96 ml    LABS: Basic Metabolic Panel:  Recent Labs  16/06/9607/07/17 1339 04/13/16 0606  NA 126* 138  K 7.2* 4.3  CL 88* 98*  CO2 26 33*  GLUCOSE 384* 133*  BUN 64* 23*  CREATININE 7.38* 4.28*  CALCIUM 9.4 8.7*   Liver Function Tests: No results for input(s): AST, ALT, ALKPHOS, BILITOT, PROT, ALBUMIN in the last 72 hours. No results for input(s): LIPASE, AMYLASE in the last 72 hours. CBC:  Recent Labs  04/13/16 0606 04/14/16 0412  WBC 7.6 10.0  HGB 10.8* 10.7*  HCT 29.9* 29.2*  MCV 92.3 93.2  PLT 204 188   Cardiac Enzymes:  Recent Labs  04/12/16 1339 04/12/16 1801 04/13/16 0018 04/13/16 0606  CKTOTAL 368  --   --   --   TROPONINI <0.03 <0.03 <0.03 <0.03   BNP: Invalid input(s): POCBNP D-Dimer: No results for input(s): DDIMER in the last 72 hours. Hemoglobin A1C: No results for input(s): HGBA1C in the last 72 hours. Fasting Lipid Panel: No results for input(s): CHOL, HDL, LDLCALC, TRIG, CHOLHDL, LDLDIRECT in the last 72 hours. Thyroid Function Tests: No results for input(s): TSH, T4TOTAL, T3FREE, THYROIDAB in the last 72 hours.  Invalid input(s): FREET3 Anemia Panel: No results for input(s): VITAMINB12, FOLATE, FERRITIN, TIBC, IRON, RETICCTPCT in the last 72 hours.   PHYSICAL EXAM General: Well developed, well nourished, in no acute distress HEENT:  Normocephalic and atramatic Neck:  No JVD.  Lungs: Clear bilaterally to auscultation and percussion. Heart: HRRR . Normal S1 and S2 without gallops or  murmurs.  Abdomen: Bowel sounds are positive, abdomen soft and non-tender  Msk:  Back normal, normal gait. Normal strength and tone for age. Extremities: No clubbing, cyanosis or edema.   Neuro: Alert and oriented X 3. Psych:  Good affect, responds appropriately  TELEMETRY:Sinus rhythm  ASSESSMENT AND PLAN: Atypical chest pain with MI being ruled out and blood pressure much better. Patient can be discharged with follow-up Tuesday in my office at 2 PM.  Active Problems:   Hyperkalemia    Shavaun Osterloh A, MD, [facility] 04/15/2016 8:24 AM

## 2016-04-15 NOTE — Progress Notes (Signed)
PRE HD ASSESSMENT 

## 2016-04-15 NOTE — Progress Notes (Signed)
[facility] [place] [facility]   PATIENT NAME: Marcus [name]    MR#:  [id number]  DATE OF BIRTH:  1965-09-13  SUBJECTIVE:  He  denies any complaints. Still needing IV nitro drip at 70 mics per minute. Waiting for 2 doses of labetalol, clonidine, hydralazine. No headache or shortness of breath. Supposed to get dialysis today. Spoke with the help o Manufacturing engineerspanish  Translator.  CHIEF COMPLAINT:  No chief complaint on file.   REVIEW OF SYSTEMS:    Review of Systems  Constitutional: Negative for chills and fever.  HENT: Negative for hearing loss and tinnitus.   Eyes: Negative for blurred vision, double vision and photophobia.  Respiratory: Negative for cough, hemoptysis and shortness of breath.   Cardiovascular: Negative for chest pain, palpitations, orthopnea and leg swelling.  Gastrointestinal: Negative for abdominal pain, diarrhea, heartburn, nausea and vomiting.  Genitourinary: Negative for dysuria and urgency.  Musculoskeletal: Negative for myalgias and neck pain.  Skin: Negative for itching and rash.  Neurological: Negative for dizziness, tingling, focal weakness, seizures, weakness and headaches.  Endo/Heme/Allergies: Negative for environmental allergies. Does not bruise/bleed easily.  Psychiatric/Behavioral: Negative for depression, memory loss, substance abuse and suicidal ideas. The patient does not have insomnia.     Nutrition:  Tolerating Diet: Tolerating PT:      DRUG ALLERGIES:   Allergies  Allergen Reactions  . Fish-Derived Products Rash    VITALS:  Blood pressure (!) 166/62, pulse 69, temperature 97.8 F (36.6 C), temperature source Oral, resp. rate 17, height 5\' 4"  (1.626 m), weight 70 kg (154 lb 5.2 oz), SpO2 100 %.  PHYSICAL EXAMINATION:   Physical Exam  GENERAL:  50 y.o.-year-old patient lying in the bed with no acute distress.  EYES: Pupils equal, round, reactive to light and accommodation. No scleral icterus. Extraocular  muscles intact.  HEENT: Head atraumatic, normocephalic. Oropharynx and nasopharynx clear.  NECK:  Supple, no jugular venous distention. No thyroid enlargement, no tenderness.  LUNGS: Normal breath sounds bilaterally, no wheezing, rales,rhonchi or crepitation. No use of accessory muscles of respiration.  CARDIOVASCULAR: S1, S2 normal. No murmurs, rubs, or gallops.  ABDOMEN: Soft, nontender, nondistended. Bowel sounds present. No organomegaly or mass.  EXTREMITIES: No pedal edema, cyanosis, or clubbing.  NEUROLOGIC: Cranial nerves II through XII are intact. Muscle strength 5/5 in all extremities. Sensation intact. Gait not checked.  PSYCHIATRIC: The patient is alert and oriented x 3.  SKIN: No obvious rash, lesion, or ulcer.    LABORATORY PANEL:   CBC  Recent Labs Lab 04/14/16 0412  WBC 10.0  HGB 10.7*  HCT 29.2*  PLT 188   ------------------------------------------------------------------------------------------------------------------  Chemistries   Recent Labs Lab 04/13/16 0606  NA 138  K 4.3  CL 98*  CO2 33*  GLUCOSE 133*  BUN 23*  CREATININE 4.28*  CALCIUM 8.7*   ------------------------------------------------------------------------------------------------------------------  Cardiac Enzymes  Recent Labs Lab 04/13/16 0606  TROPONINI <0.03   ------------------------------------------------------------------------------------------------------------------  RADIOLOGY:  Dg Hip Unilat With Pelvis 2-3 Views Left  Result Date: 04/13/2016 CLINICAL DATA:  Left hip pain EXAM: DG HIP (WITH OR WITHOUT PELVIS) 2-3V LEFT COMPARISON:  Multiple exams, including 11/29/2015 FINDINGS: Vascular calcifications observed. Bony demineralization. Mild chondral thinning in both hips. Mild spurring of both acetabula. Minimal spurring of both femoral heads, unchanged. No discrete cortical discontinuity to suggest a left hip fracture identified. IMPRESSION: 1. No fracture or acute bony  findings identified. 2. Diffuse bony demineralization.  Mild osteoarthritis of both hips. 3. Vascular calcifications. Electronically Signed  By: Walter  Liebkemann M.D.   On: 04/13/2016 14:35     ASSESSMENT ANDGaylyn Rong Problems:   Hyperkalemia   1. severe hyperkalemia: Improved with urgent hemodialysis, calcium gluconate, bicarbonate. Patient improved from 7.9-4.3. Continue routine HD.  #2 malignant hypertension still on IV nitro drip, seen by cardiology started on labetalol, hydralazine. Already on norvasc,hydralazine,clonidine,labetalol,  . #3 3.chest pain in a patient with coronary artery disease. Troponins are negative. Appreciate cardiology following the patient. Continue aspirin, Plavix, statins., Beta blockers .EF 55% withLVH  Chest pain;resolved  Diabetes mellitus type 2; continue SSI coverage, ,lantus.started on lantus.  . : #4 history of seizure disorder: Continue Keppra    can be Transfer out of ICU  If  patient is off the nitro drip. D/w pt.    All the records are reviewed and case discussed with Care Management/Social Workerr. Management plans discussed with the patient, family and they are in agreement.  CODE STATUS: full  TOTAL TIME TAKING CARE OF THIS PATIENT: 35(CCT) minutes.   POSSIBLE D/C IN 2-3 DAYS, DEPENDING ON CLINICAL CONDITION.   Katha Hamming M.D on 04/15/2016 at 9:40 AM  Between 7am to 6pm - Pager - [phone]  After 6pm go to [email or web address] - password [email or web address] [facility]  [place] [facility]  Office  [phone]  CC: Primary care physician; [facility]

## 2016-04-15 NOTE — Progress Notes (Signed)
Post HD vitals 

## 2016-04-15 NOTE — Progress Notes (Signed)
Blood pressure is much better, thus will give labetolol 300 bid and not give clonidine as is in same class of drugs.

## 2016-04-16 LAB — GLUCOSE, CAPILLARY
GLUCOSE-CAPILLARY: 251 mg/dL — AB (ref 65–99)
GLUCOSE-CAPILLARY: 292 mg/dL — AB (ref 65–99)
GLUCOSE-CAPILLARY: 224 mg/dL — AB (ref 65–99)
Glucose-Capillary: 98 mg/dL (ref 65–99)
Glucose-Capillary: 218 mg/dL — ABNORMAL HIGH (ref 65–99)
Glucose-Capillary: 143 mg/dL — ABNORMAL HIGH (ref 65–99)

## 2016-04-16 MED ORDER — INSULIN ASPART 100 UNIT/ML ~~LOC~~ SOLN
2.0000 [IU] | Freq: Three times a day (TID) | SUBCUTANEOUS | Status: DC
  Administered 2016-04-16 – 2016-04-17 (×2): 2 [IU] via SUBCUTANEOUS
  Filled 2016-04-16: qty 2

## 2016-04-16 NOTE — Progress Notes (Signed)
Patient and patient's wife expressed concern about "losing [his] spot" at the dialysis center and would like reassurance from nephrology that he will still be able to go to dialysis center at discharge. They also spoke about how they had been told his time slot would change to 08:00 am rather than his current 05:20 am. They stated they need to keep the 05:20 am slot as the person who is driving the patient to dialysis is not available at 08:00.

## 2016-04-16 NOTE — Progress Notes (Signed)
[facility] - [place] at [facility]   PATIENT NAME: Marcus [name]    MR#:  [id number]  DATE OF BIRTH:  1966/02/20  SUBJECTIVE:  Denies any complaints. Discussed with the help of translator. Patient has been having trouble getting to appointments and also affording the medications.   CHIEF COMPLAINT:  No chief complaint on file.   REVIEW OF SYSTEMS:    Review of Systems  Constitutional: Negative for chills and fever.  HENT: Negative for hearing loss and tinnitus.   Eyes: Negative for blurred vision, double vision and photophobia.  Respiratory: Negative for cough, hemoptysis and shortness of breath.   Cardiovascular: Negative for chest pain, palpitations, orthopnea and leg swelling.  Gastrointestinal: Negative for abdominal pain, diarrhea, heartburn, nausea and vomiting.  Genitourinary: Negative for dysuria and urgency.  Musculoskeletal: Negative for myalgias and neck pain.  Skin: Negative for itching and rash.  Neurological: Negative for dizziness, tingling, focal weakness, seizures, weakness and headaches.  Endo/Heme/Allergies: Negative for environmental allergies. Does not bruise/bleed easily.  Psychiatric/Behavioral: Negative for depression, memory loss, substance abuse and suicidal ideas. The patient does not have insomnia.     Nutrition:  Tolerating Diet: Tolerating PT:      DRUG ALLERGIES:   Allergies  Allergen Reactions  . Fish-Derived Products Rash    VITALS:  Blood pressure (!) 149/62, pulse 68, temperature 98.6 F (37 C), temperature source Oral, resp. rate 13, height  (1.626 m), weight 65.3 kg (143 lb 15.4 oz), SpO2 100 %.  PHYSICAL EXAMINATION:   Physical Exam  GENERAL:  50 y.o.-year-old patient lying in the bed with no acute distress.  EYES: Pupils equal, round, reactive to light and accommodation. No scleral icterus. Extraocular muscles intact.  HEENT: Head atraumatic, normocephalic. Oropharynx and nasopharynx clear.   NECK:  Supple, no jugular venous distention. No thyroid enlargement, no tenderness.  LUNGS: Normal breath sounds bilaterally, no wheezing, rales,rhonchi or crepitation. No use of accessory muscles of respiration.  CARDIOVASCULAR: S1, S2 normal. No murmurs, rubs, or gallops.  ABDOMEN: Soft, nontender, nondistended. Bowel sounds present. No organomegaly or mass.  EXTREMITIES: No pedal edema, cyanosis, or clubbing.  NEUROLOGIC: Cranial nerves II through XII are intact. Muscle strength 5/5 in all extremities. Sensation intact. Gait not checked.  PSYCHIATRIC: The patient is alert and oriented x 3.  SKIN: No obvious rash, lesion, or ulcer.    LABORATORY PANEL:   CBC  Recent Labs Lab 04/14/16 0412  WBC 10.0  HGB 10.7*  HCT 29.2*  PLT 188   ------------------------------------------------------------------------------------------------------------------  Chemistries   Recent Labs Lab 04/13/16 0606  NA 138  K 4.3  CL 98*  CO2 33*  GLUCOSE 133*  BUN 23*  CREATININE 4.28*  CALCIUM 8.7*   ------------------------------------------------------------------------------------------------------------------  Cardiac Enzymes  Recent Labs Lab 04/13/16 0606  TROPONINI <0.03   ------------------------------------------------------------------------------------------------------------------  RADIOLOGY:  No results found.   ASSESSMENT AND PLAN:   Active Problems:   Hyperkalemia   1. severe hyperkalemia: Improved with urgent hemodialysis, calcium gluconate, bicarbonate. Patient improved from 7.9-4.3. Continue routine HD.  #2 malignant hypertension In of the nitro drip, the parameters are adjusted. Continue hydralazine, labetalol, clonidine, clonidine,norvasc, Appreciate case manager's help for his medications. Likely discharge tomorrow but patient can be moved to telemetry. Watch the blood pressure off the nitro drip today. Patient on nitro drip  Since admission . #3 3.chest  pain in a patient with coronary artery disease. Troponins are negative. Appreciate cardiology following the patient. Continue aspirin, Plavix, statins., Beta blockers .EF  55% withLVH  Chest pain;resolved  Diabetes mellitus type 2; ;  Elevated blood sugars. Add NovoLog 2 units it 3 times a day with meals.. : #4 history of seizure disorder: Continue Keppra    can be Transfer out of ICU  If  patient is off the nitro drip. D/w pt.    All the records are reviewed and case discussed with Care Management/Social Workerr. Management plans discussed with the patient, family and they are in agreement.  CODE STATUS: full  TOTAL TIME TAKING CARE OF THIS PATIENT: 35(CCT) minutes.   POSSIBLE D/C IN 2-3 DAYS, DEPENDING ON CLINICAL CONDITION.   Katha HammingKONIDENA,Donika Butner M.D on 04/16/2016 at 12:20 PM  Between 7am to 6pm - Pager - [phone]  After 6pm go to [email or web address] - password [email or web address] [facility]  [facility]  Office  [phone]  CC: Primary care physician; [facility]

## 2016-04-16 NOTE — Care Management (Addendum)
RNCM consult for transportation needs to dialysis center. Nunzio Coryarol Anne social worker with [facility] [phone] notified. He doesn't qualify for Medicaid or Medicare assistance- he is undocumented. There are currently no resources available- Nunzio CoryCarol Anne is going to look into this and call me back. I will also speak with CSW to see if they have any resources. IF patient is receiving emergency medicaid for outpatient dialysis- which I understand that he is- ?maybe he could qualify for medicaid transportation?  Patient has had family/friends visiting him and that is probably going to be their best option. Other options might include [facility], [facility], [facility]- there is a cost involved. Dialysis may need to change chair time to later in the day to meet transportation needs. Ivor ReiningKim [facility] liaison notified of this need.Kim text back stating that she cannot assist with any transportation needs and this is the responsibility of the center's social worker- which already said there was no resources available. CSW will check with [facility] according to our last conversation.

## 2016-04-16 NOTE — Progress Notes (Signed)
  SUBJECTIVE: Patient is comfortable in bed. He denies any chest pain, shortness of breath or headache. Is anxious to know when he can go home.   Vitals:   04/16/16 0600 04/16/16 0700 04/16/16 0730 04/16/16 0737  BP: (!) 150/60 129/85 (!) 156/69   Pulse: 69 70 69 68  Resp: (!) 9 10 12 15   Temp:    98.6 F (37 C)  TempSrc:    Oral  SpO2: 97% 99% 99% 99%  Weight:      Height:        Intake/Output Summary (Last 24 hours) at 04/16/16 0752 Last data filed at 04/16/16 0710  Gross per 24 hour  Intake           963.12 ml  Output             2500 ml  Net         -1536.88 ml    LABS: Basic Metabolic Panel: No results for input(s): NA, K, CL, CO2, GLUCOSE, BUN, CREATININE, CALCIUM, MG, PHOS in the last 72 hours. Liver Function Tests: No results for input(s): AST, ALT, ALKPHOS, BILITOT, PROT, ALBUMIN in the last 72 hours. No results for input(s): LIPASE, AMYLASE in the last 72 hours. CBC:  Recent Labs  04/14/16 0412  WBC 10.0  HGB 10.7*  HCT 29.2*  MCV 93.2  PLT 188   Cardiac Enzymes: No results for input(s): CKTOTAL, CKMB, CKMBINDEX, TROPONINI in the last 72 hours. BNP: Invalid input(s): POCBNP D-Dimer: No results for input(s): DDIMER in the last 72 hours. Hemoglobin A1C:  Recent Labs  04/14/16 1759  HGBA1C 9.1*   Fasting Lipid Panel: No results for input(s): CHOL, HDL, LDLCALC, TRIG, CHOLHDL, LDLDIRECT in the last 72 hours. Thyroid Function Tests: No results for input(s): TSH, T4TOTAL, T3FREE, THYROIDAB in the last 72 hours.  Invalid input(s): FREET3 Anemia Panel: No results for input(s): VITAMINB12, FOLATE, FERRITIN, TIBC, IRON, RETICCTPCT in the last 72 hours.   PHYSICAL EXAM General: Well developed, well nourished, in no acute distress HEENT:  Normocephalic and atramatic Neck:  No JVD.  Lungs: Clear bilaterally to auscultation and percussion. Heart: HRRR . Normal S1 and S2 without gallops or murmurs.  Abdomen: Bowel sounds are positive, abdomen soft and  non-tender  Msk:  Back normal, normal gait. Normal strength and tone for age. Extremities: No clubbing, cyanosis or edema.   Neuro: Alert and oriented X 3. Psych:  Good affect, responds appropriately  TELEMETRY: NSR 70's  ASSESSMENT AND PLAN: Malignant hypertension. BP is improving with medication adjustments. Is still on NTG drip as nurses following parameters. Nurse instructed to wean off NTG and will work with oral meds. Hydralazine and labetalol have been maximized. Will probably be ready for discharge this afternoon or tomorrow.   Atypical chest pain, ruled out for MI.  Follow up in our office Tuesday at 2pm.  Active Problems:   Hyperkalemia   [facility] Berton BonJanine Trany Chernick, NP 04/16/2016 7:52 AM

## 2016-04-16 NOTE — Progress Notes (Addendum)
Report called to [place] on 2A. Patient to be moved to room 235. Patient finished walking with PT earlier and physical therapists report that per discussion with patient and family that patient does not have a walker at home.

## 2016-04-16 NOTE — Progress Notes (Signed)
Inpatient Diabetes Program Recommendations  AACE/ADA: New Consensus Statement on Inpatient Glycemic Control (2015)  Target Ranges:  Prepandial:   less than 140 mg/dL      Peak postprandial:   less than 180 mg/dL (1-2 hours)      Critically ill patients:  140 - 180 mg/dL   Lab Results  Component Value Date   GLUCAP 98 04/16/2016   HGBA1C 9.1 (H) 04/14/2016    Review of Glycemic Control  Results for Carman ChingGONZALEZ, Raedyn (MRN [id number]) as of 04/16/2016 09:22  Ref. Range 04/15/2016 11:01 04/15/2016 16:25 04/15/2016 17:02 04/15/2016 21:34 04/16/2016 07:32  Glucose-Capillary Latest Ref Range: 65 - 99 mg/dL 213234 (H) 65 92 086177 (H) 98     Outpatient Diabetes medications: 70/30 5 units with breakfast, 70/30 10 units with supper Current orders for Inpatient glycemic control: Novolog 0-9 units TID with meals, Levemir 5 units qhs  Inpatient Diabetes Program Recommendations:  Post pranidal blood sugars remain elevated- consider adding Novolog 2 units tid with meals- continue Novolog correction as ordered.  He had a low blood sugar because we gave him very little insulin one meal- resulting in a high blood sugar the next meal.  We then gave him a big dose of insulin resulting in a low.  If we give him a small amount of Novolog insulin every meal, his blood sugars will be less labile.  Susette RacerJulie Verlaine Embry, RN, BA, MHA, CDE Diabetes Coordinator Inpatient Diabetes Program  [phone] (Team Pager) [phone] [facility] Office) 04/16/2016 9:24 AM

## 2016-04-16 NOTE — Care Management (Signed)
Rolling walker requested from [facility] to be delivered to patient prior to discharge.

## 2016-04-16 NOTE — Progress Notes (Signed)
Physical Therapy Treatment Patient Details Name: Marcus [name] MRN: [id number] DOB: 19-Dec-1965 Today's Date: 04/16/2016    History of Present Illness Pt was admitted with hypokalemia and has a Hx of hyperglygemia, CAP, ESRD, chronic diastolic heart failure, CAD, and syncope    PT Comments    Pt is progressing towards goals. He walked 200' with RW and 6450' without RW, all with min assist. Without RW pt staggers and drifts R/L and has decreased safety awareness; needed cuing to avoid bumping into objects, likely due to general weakness and impaired sensation in feet. Iminent fall risk without RW. He reports his toes feel like they are asleep and his legs are cold; pt instructed to continue doing frequent ankle pumps. Pt is appropriate for continued PT at this time to address deficits in strength, balance, coordination, endurance, gait, safety awareness, and safe use of DME. Recommend he continue with OP PT.    Follow Up Recommendations  Outpatient PT     Equipment Recommendations  Rolling walker with 5" wheels    Recommendations for Other Services       Precautions / Restrictions Precautions Precautions: Fall Precaution Comments: Use RW and close guard at all times Restrictions Weight Bearing Restrictions: No    Mobility  Bed Mobility Overal bed mobility: Independent             General bed mobility comments: Pt has good strength and coordination and moved to EOB with little effort with PT supervision    Transfers Overall transfer level: Modified independent Equipment used: Rolling walker (2 wheeled) Transfers: Sit to/from Stand Sit to Stand: Supervision         General transfer comment: Requires RW to prevent LOB likely due to general weakness and impaired sensation in feet  Ambulation/Gait Ambulation/Gait assistance: Min assist Ambulation Distance (Feet): 250 Feet Assistive device: Rolling walker (2 wheeled);1 person hand held assist Gait Pattern/deviations:  Step-through pattern;Decreased step length - left;Decreased stride length;Staggering left;Staggering right;Drifts right/left (without RW) Gait velocity: improved (With RW)   General Gait Details: Without RW pt staggers and drifts R/L and has decreased safety awareness; needed cuing to avoid bumping into objects, likely due to general weakness and impaired sensation in feet. Iminent fall risk without RW   Stairs            Wheelchair Mobility    Modified Rankin (Stroke Patients Only)       Balance Overall balance assessment: Needs assistance Sitting-balance support: Feet supported Sitting balance-Leahy Scale: Normal Sitting balance - Comments: Independent   Standing balance support: Bilateral upper extremity supported Standing balance-Leahy Scale: Fair Standing balance comment: Due to decreased sensation and general deconditioning pt required B UE support for standing balance. Without RW he has increased sway and cannot tolerate and challenge                    Cognition Arousal/Alertness: Awake/alert Behavior During Therapy: WFL for tasks assessed/performed Overall Cognitive Status: Within Functional Limits for tasks assessed                      Exercises General Exercises - Lower Extremity Ankle Circles/Pumps: 10 reps;Both;Strengthening;Supine Other Exercises Other Exercises: Gait training with and without RW with verbal and tactile cues for safety awareness and improved gait mechanics    General Comments        Pertinent Vitals/Pain Pain Assessment: No/denies pain    Home Living  Prior Function            PT Goals (current goals can now be found in the care plan section) Acute Rehab PT Goals Patient Stated Goal: go home, walk more/ get out of bed PT Goal Formulation: With patient Time For Goal Achievement: 04/28/16 Potential to Achieve Goals: Good Progress towards PT goals: Progressing toward goals     Frequency  Min 2X/week    PT Plan Current plan remains appropriate    Co-evaluation             End of Session Equipment Utilized During Treatment: Gait belt Activity Tolerance: Patient tolerated treatment well Patient left: in bed;with call bell/phone within reach;with family/visitor present     Time: 1610-9604 PT Time Calculation (min) (ACUTE ONLY): 25 min  Charges:  $Gait Training: 8-22 mins $Therapeutic Exercise: 8-22 mins                    G Codes:      Sita Mangen May 05, 2016, 4:36 PM  Cassell Smiles, SPT [phone]

## 2016-04-16 NOTE — Progress Notes (Signed)
[facility]  ROUNDING NOTE   Subjective:  Patient underwent HD yesterday. 2.5 kg was removed BP is controlled with NTG drip Potassium corrected after HD  no c/o today No nausea or vomiting reported    Objective:  Vital signs in last 24 hours:  Temp:  [98 F (36.7 C)-98.6 F (37 C)] 98.6 F (37 C) (08/11 0737) Pulse Rate:  [64-74] 68 (08/11 0900) Resp:  [0-25] 13 (08/11 0900) BP: (106-162)/(26-121) 149/62 (08/11 0900) SpO2:  [95 %-100 %] 100 % (08/11 0900) Weight:  [65.3 kg (143 lb 15.4 oz)-70 kg (154 lb 5.2 oz)] 65.3 kg (143 lb 15.4 oz) (08/11 0535)  Weight change: 0 kg (0 lb) Filed Weights   04/15/16 1135 04/15/16 1510 04/16/16 0535  Weight: 70 kg (154 lb 5.2 oz) 65.6 kg (144 lb 10 oz) 65.3 kg (143 lb 15.4 oz)    Intake/Output: I/O last 3 completed shifts: In: 1463.1 [P.O.:480; I.V.:983.1] Out: 2500 [Other:2500]   Intake/Output this shift:  Total I/O In: 514.2 [P.O.:240; I.V.:274.2] Out: -   Physical Exam: General: No acute distress  Head: Normocephalic, atraumatic. Moist oral mucosal membranes  Eyes: Anicteric  Neck: Supple, trachea midline  Lungs:  Clear today, normal effort  Heart: S1S2 no rubs  Abdomen:  Soft, nontender, BS present  Extremities: Trace peripheral edema.  Neurologic: Nonfocal, moving all four extremities  Skin: No lesions  Access: LUE aneurysmal AVF    Basic Metabolic Panel:  Recent Labs Lab 04/12/16 1339 04/13/16 0606  NA 126* 138  K 7.2* 4.3  CL 88* 98*  CO2 26 33*  GLUCOSE 384* 133*  BUN 64* 23*  CREATININE 7.38* 4.28*  CALCIUM 9.4 8.7*    Liver Function Tests: No results for input(s): AST, ALT, ALKPHOS, BILITOT, PROT, ALBUMIN in the last 168 hours. No results for input(s): LIPASE, AMYLASE in the last 168 hours. No results for input(s): AMMONIA in the last 168 hours.  CBC:  Recent Labs Lab 04/12/16 1339 04/13/16 0606 04/14/16 0412  WBC 10.7* 7.6 10.0  HGB 12.1* 10.8* 10.7*  HCT 34.1* 29.9* 29.2*   MCV 92.5 92.3 93.2  PLT 204 204 188    Cardiac Enzymes:  Recent Labs Lab 04/12/16 1339 04/12/16 1801 04/13/16 0018 04/13/16 0606  CKTOTAL 368  --   --   --   TROPONINI <0.03 <0.03 <0.03 <0.03    BNP: Invalid input(s): POCBNP  CBG:  Recent Labs Lab 04/15/16 1101 04/15/16 1625 04/15/16 1702 04/15/16 2134 04/16/16 0732  GLUCAP 234* 65 92 177* 98    Microbiology: Results for orders placed or performed during the hospital encounter of 04/12/16  MRSA PCR Screening     Status: None   Collection Time: 04/12/16  6:05 PM  Result Value Ref Range Status   MRSA by PCR NEGATIVE NEGATIVE Final    Comment:        The GeneXpert MRSA Assay (FDA approved for NASAL specimens only), is one component of a comprehensive MRSA colonization surveillance program. It is not intended to diagnose MRSA infection nor to guide or monitor treatment for MRSA infections.     Coagulation Studies: No results for input(s): LABPROT, INR in the last 72 hours.  Urinalysis: No results for input(s): COLORURINE, LABSPEC, PHURINE, GLUCOSEU, HGBUR, BILIRUBINUR, KETONESUR, PROTEINUR, UROBILINOGEN, NITRITE, LEUKOCYTESUR in the last 72 hours.  Invalid input(s): APPERANCEUR    Imaging: No results found.   Medications:     . amLODipine  10 mg Oral Daily  . antiseptic oral rinse  7 mL Mouth Rinse BID  . aspirin EC  81 mg Oral Daily  . atorvastatin  80 mg Oral QHS  . calcium acetate  667 mg Oral TID WC  . clopidogrel  75 mg Oral QHS  . docusate sodium  100 mg Oral BID  . doxazosin  1 mg Oral QHS  . gabapentin  300 mg Oral QHS  . heparin  5,000 Units Subcutaneous Q8H  . hydrALAZINE  100 mg Oral Q6H  . insulin aspart  0-9 Units Subcutaneous TID WC  . insulin detemir  5 Units Subcutaneous QHS  . irbesartan  300 mg Oral QPC supper  . labetalol  300 mg Oral BID  . levETIRAcetam  500 mg Oral BID  . levothyroxine  100 mcg Oral QAC breakfast  . LORazepam  0.5 mg Oral QHS  . pantoprazole  40  mg Oral Daily     Assessment/ Plan:  50 y.o. male with uncontrolled diabetes mellitus type II, hypertension, hyperlipidemia, hypothyroidism, BPH, coronary artery disease status post stent.   [facility] [address]  1. End stage renal disease: with severe hyperkalemia - s/p Urgent HD for severe hyperkalemia 8/7.  - K level corrected now - Next HD tomorrow   2. Anemia of CKD:  Hemoglobin 10.7 Will start procrit once BP is better controlled  3.  Secondary hyperparathyroidism:  Monitor phosphorus with dialysis   4. Severe HTN - patient is on NTG drip - continue irbesartan - continue clonidine  5. Peripheral neuropathy - Gabapentin to 300 qhs     LOS: 4 Endre Coutts 8/11/201711:12 AM

## 2016-04-16 NOTE — Clinical Social Work Note (Signed)
Clinical Social Work Assessment  Patient Details  Name: Marcus [name] MRN: [id number] Date of Birth: Jul 27, 1966  Date of referral:  04/16/16               Reason for consult:  Discharge Planning, Transportation                Permission sought to share information with:    Permission granted to share information::     Name::        Agency::     Relationship::     Contact Information:     Housing/Transportation Living arrangements for the past 2 months:  Single Family Home Source of Information:  Patient Patient Interpreter Needed:  None Criminal Activity/Legal Involvement Pertinent to Current Situation/Hospitalization:  No - Comment as needed Significant Relationships:  Other Family Members Lives with:  Self Do you feel safe going back to the place where you live?  No Need for family participation in patient care:  No (Coment)  Care giving concerns:  Patient reports that he is having difficulty obtaining his medications and having transportation issues.    Social Worker assessment / plan:  CSW met with patient at bedside. Introduced herself and her role. Patient reports that he does not have insurance and is having a difficult time obtaining his medications and getting transportation to dialysis and other MD appointments. CSW provided patient with [facility] and [facility] information. CSW informed patient that Orion Modest is $6 for a round-trip ride. He stated his funds are low because he hasn't been able to work but thinks he can get $6. Stated that he wants help with his medication. CSW informed RNCM of above. CSW is signing off. There are no other CSW needs at this time. CSW is available if a need were to arise.   Employment status:  Cytogeneticist information:  Self Pay (Medicaid Pending) PT Recommendations:  Not assessed at this time Information / Referral to community resources:  Other (Comment Required) ([facility])  Patient/Family's Response to care:  Patient stated he'll try to use  [facility] when he's discharged.   Patient/Family's Understanding of and Emotional Response to Diagnosis, Current Treatment, and Prognosis:  Patient reports that he understands his Diagnosis, Current Treatment, and Prognosis. Thanked CSW for assistance.   Emotional Assessment Appearance:  Appears stated age Attitude/Demeanor/Rapport:   (None) Affect (typically observed):  Calm, Pleasant, Accepting Orientation:  Oriented to Self, Oriented to Place, Oriented to  Time, Oriented to Situation Alcohol / Substance use:  Not Applicable Psych involvement (Current and /or in the community):  No (Comment)  Discharge Needs  Concerns to be addressed:  Discharge Planning Concerns Readmission within the last 30 days:  No Current discharge risk:  Chronically ill Barriers to Discharge:  Continued Medical Work up   [employer], LCSW 04/16/2016, 4:35 PM

## 2016-04-17 LAB — CBC
HEMATOCRIT: 28.4 % — AB (ref 40.0–52.0)
HEMOGLOBIN: 10.4 g/dL — AB (ref 13.0–18.0)
MCH: 33.8 pg (ref 26.0–34.0)
MCHC: 36.6 g/dL — ABNORMAL HIGH (ref 32.0–36.0)
MCV: 92.4 fL (ref 80.0–100.0)
Platelets: 184 10*3/uL (ref 150–440)
RBC: 3.07 MIL/uL — AB (ref 4.40–5.90)
RDW: 14.2 % (ref 11.5–14.5)
WBC: 9.2 10*3/uL (ref 3.8–10.6)

## 2016-04-17 LAB — GLUCOSE, CAPILLARY
GLUCOSE-CAPILLARY: 101 mg/dL — AB (ref 65–99)
Glucose-Capillary: 125 mg/dL — ABNORMAL HIGH (ref 65–99)

## 2016-04-17 MED ORDER — IRBESARTAN 300 MG PO TABS: 300.0000 mg | ORAL_TABLET | Freq: Every day | ORAL | 0 refills | Status: AC

## 2016-04-17 MED ORDER — HYDRALAZINE HCL 100 MG PO TABS: 100.0000 mg | ORAL_TABLET | Freq: Three times a day (TID) | ORAL | 0 refills | Status: DC

## 2016-04-17 MED ORDER — LABETALOL HCL 300 MG PO TABS: 300.0000 mg | ORAL_TABLET | Freq: Two times a day (BID) | ORAL | 0 refills | Status: DC

## 2016-04-17 NOTE — Progress Notes (Signed)
POST DIALYSIS ASSESSMENT 

## 2016-04-17 NOTE — Progress Notes (Signed)
Orders to d/c pt to home post HD. Once pt returned from HD vitals and cbg taken and meds given. CM came to speak to pt and family regarding retrieving meds post d/c. IV and tele removed and discharge instructions given to pt and family with verbal acknowledgment of understanding. Pt escorted off unit via wheelchair by nursing.

## 2016-04-17 NOTE — Care Management Note (Signed)
Case Management Note  Patient Details  Name: Carman Chinglfredo Finder MRN: [id number] Date of Birth: 12/28/1965  Subjective/Objective:    Provided Mr Jayme CloudGonzalez with information about contacting [facility] for outpatient PT, a MATCH Coupon for meds, and a referral form for [facility].                 Action/Plan:   Expected Discharge Date:                  Expected Discharge Plan:     In-House Referral:     Discharge planning Services     Post Acute Care Choice:    Choice offered to:     DME Arranged:    DME Agency:     HH Arranged:    HH Agency:     Status of Service:     If discussed at [employer] Length of Stay Meetings, dates discussed:    Additional Comments:  Jaicee Michelotti A, RN 04/17/2016, 9:38 AM

## 2016-04-17 NOTE — Discharge Summary (Signed)
[employer] - [place] at [facility]   PATIENT NAME: Marcus [name]    MR#:  [id number]  DATE OF BIRTH:  [birth year]  DATE OF ADMISSION:  04/12/2016 ADMITTING PHYSICIAN: Katha Hamming, MD  DATE OF DISCHARGE: 04/17/2016  PRIMARY CARE PHYSICIAN: [facility] [place]    ADMISSION DIAGNOSIS:  Hyperkalemia [E87.5] Hypertensive urgency [I16.0] Pain of lower extremity, unspecified laterality [M79.606] Chest pain, unspecified chest pain type [R07.9] Nonintractable headache, unspecified chronicity pattern, unspecified headache type [R51] Diarrhea, unspecified type [R19.7]  DISCHARGE DIAGNOSIS:  Active Problems:   Hyperkalemia   SECONDARY DIAGNOSIS:   Past Medical History:  Diagnosis Date  . BPH (benign prostatic hyperplasia)   . CAD (coronary artery disease) of artery bypass graft    a. s/p multipe PCI to LCx and OMI; Last cath in 07/2014 w/ POBA to 70% stenosis in LCx   . Chronic diastolic CHF (congestive heart failure) (HCC)    a. EF 55-60% by echo in 08/2015; Grade 2 DD noted.  . Depression   . Diabetes mellitus without complication (HCC)   . Dialysis patient [facility])    a. Tue/Th/Sat Schedule  . High cholesterol   . Hypertension   . Renal disorder   . 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 For further details please review H&P.  1. Severe hyperkalemia: Patient was taken for dialysis where his potassium had normalized. Potassium has been stable.  2. Malignant hypertension: Due to severely limited blood pressure patient was placed on nitroglycerin drip. He was continued as outpatient medications with some adjustments in his medications and changes. He will no longer be taking ACE inhibitor but instead he will be taking ARB. Hydralazine dose was increased. He was also changed from  Court to labetalol. The pressure is currently acceptable. Patient will need close follow-up with his nephrologist as an outpatient.   3. Chest pain: This is due to malignant hypertension. 2-D echocardiogram shows LVH consistent with uncontrolled blood pressure and ejection fraction of 55%.  4 diabetes: Patient will resume her outpatient medications and ADA diet.  5. History of seizure disorder: Continue Keppra.     DISCHARGE CONDITIONS AND DIET:    Stable for discharge on renal/diabetic diet   CONSULTS OBTAINED:  Treatment Team:  Mosetta Pigeon, MD Laurier Nancy, MD  DRUG ALLERGIES:   Allergies  Allergen Reactions  . Fish-Derived Products Rash    DISCHARGE MEDICATIONS:   Current Discharge Medication List    START taking these medications   Details  irbesartan (AVAPRO) 300 MG tablet Take 1 tablet (300 mg total) by mouth daily after supper. Qty: 60 tablet, Refills: 0    labetalol (NORMODYNE) 300 MG tablet Take 1 tablet (300 mg total) by mouth 2 (two) times daily. Qty: 60 tablet, Refills: 0      CONTINUE these medications which have CHANGED   Details  hydrALAZINE (APRESOLINE) 100 MG tablet Take 1 tablet (100 mg total) by mouth 3 (three) times daily. Qty: 90 tablet, Refills: 0      CONTINUE these medications which have NOT CHANGED   Details  amLODipine (NORVASC) 10 MG tablet Take 10 mg by mouth daily.    aspirin EC 81 MG tablet Take 81 mg by mouth daily.    atorvastatin (LIPITOR) 80 MG tablet Take 80 mg by mouth at bedtime.    calcium acetate (PHOSLO) 667  MG capsule Take 667 mg by mouth 3 (three) times daily with meals.     clopidogrel (PLAVIX) 75 MG tablet Take 75 mg by mouth at bedtime.    doxazosin (CARDURA) 1 MG tablet Take 1 mg by mouth at bedtime.    gabapentin (NEURONTIN) 100 MG capsule Take 100 mg by mouth at bedtime.    !! insulin aspart protamine- aspart (NOVOLOG MIX 70/30) (70-30) 100 UNIT/ML injection Inject 0.05 mLs (5 Units total) into the skin  daily with breakfast. Qty: 10 mL, Refills: 11    !! insulin aspart protamine- aspart (NOVOLOG MIX 70/30) (70-30) 100 UNIT/ML injection Inject 0.1 mLs (10 Units total) into the skin daily with supper. Qty: 10 mL, Refills: 11    levETIRAcetam (KEPPRA) 500 MG tablet Take 500 mg by mouth 2 (two) times daily.    levothyroxine (SYNTHROID, LEVOTHROID) 100 MCG tablet Take 100 mcg by mouth daily before breakfast.    LORazepam (ATIVAN) 0.5 MG tablet Take 0.5 mg by mouth at bedtime.    nitroGLYCERIN (NITROSTAT) 0.4 MG SL tablet Take 0.4 mg by mouth every 5 (five) minutes as needed.    omeprazole (PRILOSEC) 20 MG capsule Take 40 mg by mouth daily.    finasteride (PROSCAR) 5 MG tablet Take 5 mg by mouth at bedtime.    FLUoxetine (PROZAC) 40 MG capsule Take 40 mg by mouth daily.     !! - Potential duplicate medications found. Please discuss with provider.    STOP taking these medications     carvedilol (COREG) 25 MG tablet      isosorbide mononitrate (IMDUR) 120 MG 24 hr tablet      isosorbide mononitrate (IMDUR) 30 MG 24 hr tablet      lisinopril (PRINIVIL,ZESTRIL) 40 MG tablet               Today   CHIEF COMPLAINT:   Patient doing very well this morning. Denies headache or vision changes.   VITAL SIGNS:  Blood pressure (!) 151/69, pulse 77, temperature 98.1 F (36.7 C), temperature source Oral, resp. rate 10, height  (1.626 m), weight 69.5 kg (153 lb 3.5 oz), SpO2 99 %.   REVIEW OF SYSTEMS:  Review of Systems  Constitutional: Negative.  Negative for chills, fever and malaise/fatigue.  HENT: Negative.  Negative for ear discharge, ear pain, hearing loss, nosebleeds and sore throat.   Eyes: Negative.  Negative for blurred vision and pain.  Respiratory: Negative.  Negative for cough, hemoptysis, shortness of breath and wheezing.   Cardiovascular: Negative.  Negative for chest pain, palpitations and leg swelling.  Gastrointestinal: Negative.  Negative for abdominal  pain, blood in stool, diarrhea, nausea and vomiting.  Genitourinary: Negative.  Negative for dysuria.  Musculoskeletal: Negative.  Negative for back pain.  Skin: Negative.   Neurological: Negative for dizziness, tremors, speech change, focal weakness, seizures and headaches.  Endo/Heme/Allergies: Negative.  Does not bruise/bleed easily.  Psychiatric/Behavioral: Negative.  Negative for depression, hallucinations and suicidal ideas.     PHYSICAL EXAMINATION:  GENERAL:  50 y.o.-year-old patient lying in the bed with no acute distress.  NECK:  Supple, no jugular venous distention. No thyroid enlargement, no tenderness.  LUNGS: Normal breath sounds bilaterally, no wheezing, rales,rhonchi  No use of accessory muscles of respiration.  CARDIOVASCULAR: S1, S2 normal. No murmurs, rubs, or gallops.  ABDOMEN: Soft, non-tender, non-distended. Bowel sounds present. No organomegaly or mass.  EXTREMITIES: No pedal edema, cyanosis, or clubbing.  PSYCHIATRIC: The patient is alert and oriented x 3.  SKIN: No obvious rash, lesion, or ulcer.   DATA REVIEW:   CBC  Recent Labs Lab 04/17/16 0514  WBC 9.2  HGB 10.4*  HCT 28.4*  PLT 184    Chemistries   Recent Labs Lab 04/13/16 0606  NA 138  K 4.3  CL 98*  CO2 33*  GLUCOSE 133*  BUN 23*  CREATININE 4.28*  CALCIUM 8.7*    Cardiac Enzymes  Recent Labs Lab 04/12/16 1801 04/13/16 0018 04/13/16 0606  TROPONINI <0.03 <0.03 <0.03    Microbiology Results  @[email or web address] @  RADIOLOGY:  No results found.    Management plans discussed with the patient and he is in agreement. Stable for discharge home  Patient should follow up with pcp in 1 week  CODE STATUS:     Code Status Orders        Start     Ordered   04/13/16 1120  Full code  Continuous     04/13/16 1119    Code Status History    Date Active Date Inactive Code Status Order ID Comments User Context   04/13/2016 11:19 AM 04/13/2016  3:05 PM Full Code [id number]  Caryn BeeKatelyn M  Roop, RN Inpatient   04/12/2016  4:53 PM 04/13/2016 11:19 AM Full Code [id number]  Katha HammingSnehalatha Konidena, MD ED   04/02/2016  8:57 PM 04/03/2016 10:40 PM Full Code [id number]  Ramonita LabAruna Gouru, MD Inpatient   10/21/2015 10:27 AM 10/23/2015  8:21 PM Full Code [id number]  Adrian SaranSital Carleah Yablonski, MD Inpatient      TOTAL TIME TAKING CARE OF THIS PATIENT: 37 minutes.    Note: This dictation was prepared with Dragon dictation along with smaller phrase technology. Any transcriptional errors that result from this process are unintentional.  Jeannemarie Sawaya M.D on 04/17/2016 at 1:00 PM  Between 7am to 6pm - Pager - [phone] After 6pm go to [email or web address] - Social research officer, governmentpassword [email or web address]  [facility]  Office  [phone]  CC: Primary care physician; [facility]

## 2016-04-17 NOTE — Progress Notes (Signed)
HD COMPLETED  

## 2016-04-17 NOTE — Progress Notes (Signed)
PRE HD INFO 

## 2016-04-17 NOTE — Progress Notes (Signed)
Hd start 

## 2016-04-17 NOTE — Progress Notes (Signed)
[facility]  ROUNDING NOTE   Subjective:  Patient is doing well No acute c/o No SOB Able to eat without N/V BP is better controlled now off of NTG drip    Objective:  Vital signs in last 24 hours:  Temp:  [98.1 F (36.7 C)-98.5 F (36.9 C)] 98.1 F (36.7 C) (08/12 0837) Pulse Rate:  [60-72] 67 (08/12 0837) Resp:  [11-20] 17 (08/12 0837) BP: (121-167)/(49-83) 154/58 (08/12 0837) SpO2:  [96 %-100 %] 96 % (08/12 0837) Weight:  [68.9 kg (152 lb)-69.2 kg (152 lb 9.6 oz)] 69.2 kg (152 lb 9.6 oz) (08/12 0514)  Weight change: -1.053 kg (-2 lb 5.2 oz) Filed Weights   04/16/16 0535 04/16/16 1609 04/17/16 0514  Weight: 65.3 kg (143 lb 15.4 oz) 68.9 kg (152 lb) 69.2 kg (152 lb 9.6 oz)    Intake/Output: I/O last 3 completed shifts: In: 1504.2 [P.O.:1200; I.V.:304.2] Out: 1 [Stool:1]   Intake/Output this shift:  No intake/output data recorded.  Physical Exam: General: No acute distress  Head: Normocephalic, atraumatic. Moist oral mucosal membranes  Eyes: Anicteric  Neck: Supple, trachea midline  Lungs:  Clear today, normal effort  Heart: S1S2 no rubs  Abdomen:  Soft, nontender, BS present  Extremities: Trace peripheral edema.  Neurologic: Nonfocal, moving all four extremities  Skin: No lesions  Access: LUE aneurysmal AVF    Basic Metabolic Panel:  Recent Labs Lab 04/12/16 1339 04/13/16 0606  NA 126* 138  K 7.2* 4.3  CL 88* 98*  CO2 26 33*  GLUCOSE 384* 133*  BUN 64* 23*  CREATININE 7.38* 4.28*  CALCIUM 9.4 8.7*    Liver Function Tests: No results for input(s): AST, ALT, ALKPHOS, BILITOT, PROT, ALBUMIN in the last 168 hours. No results for input(s): LIPASE, AMYLASE in the last 168 hours. No results for input(s): AMMONIA in the last 168 hours.  CBC:  Recent Labs Lab 04/12/16 1339 04/13/16 0606 04/14/16 0412 04/17/16 0514  WBC 10.7* 7.6 10.0 9.2  HGB 12.1* 10.8* 10.7* 10.4*  HCT 34.1* 29.9* 29.2* 28.4*  MCV 92.5 92.3 93.2 92.4  PLT 204  204 188 184    Cardiac Enzymes:  Recent Labs Lab 04/12/16 1339 04/12/16 1801 04/13/16 0018 04/13/16 0606  CKTOTAL 368  --   --   --   TROPONINI <0.03 <0.03 <0.03 <0.03    BNP: Invalid input(s): POCBNP  CBG:  Recent Labs Lab 04/16/16 1346 04/16/16 1541 04/16/16 1611 04/16/16 2054 04/17/16 0735  GLUCAP 292* 224* 218* 143* 125*    Microbiology: Results for orders placed or performed during the hospital encounter of 04/12/16  MRSA PCR Screening     Status: None   Collection Time: 04/12/16  6:05 PM  Result Value Ref Range Status   MRSA by PCR NEGATIVE NEGATIVE Final    Comment:        The GeneXpert MRSA Assay (FDA approved for NASAL specimens only), is one component of a comprehensive MRSA colonization surveillance program. It is not intended to diagnose MRSA infection nor to guide or monitor treatment for MRSA infections.     Coagulation Studies: No results for input(s): LABPROT, INR in the last 72 hours.  Urinalysis: No results for input(s): COLORURINE, LABSPEC, PHURINE, GLUCOSEU, HGBUR, BILIRUBINUR, KETONESUR, PROTEINUR, UROBILINOGEN, NITRITE, LEUKOCYTESUR in the last 72 hours.  Invalid input(s): APPERANCEUR    Imaging: No results found.   Medications:     . amLODipine  10 mg Oral Daily  . antiseptic oral rinse  7 mL Mouth Rinse  BID  . aspirin EC  81 mg Oral Daily  . atorvastatin  80 mg Oral QHS  . calcium acetate  667 mg Oral TID WC  . clopidogrel  75 mg Oral QHS  . docusate sodium  100 mg Oral BID  . doxazosin  1 mg Oral QHS  . gabapentin  300 mg Oral QHS  . heparin  5,000 Units Subcutaneous Q8H  . hydrALAZINE  100 mg Oral Q6H  . insulin aspart  0-9 Units Subcutaneous TID WC  . insulin aspart  2 Units Subcutaneous TID WC  . insulin detemir  5 Units Subcutaneous QHS  . irbesartan  300 mg Oral QPC supper  . labetalol  300 mg Oral BID  . levETIRAcetam  500 mg Oral BID  . levothyroxine  100 mcg Oral QAC breakfast  . LORazepam  0.5 mg Oral  QHS  . pantoprazole  40 mg Oral Daily     Assessment/ Plan:  50 y.o. male with uncontrolled diabetes mellitus type II, hypertension, hyperlipidemia, hypothyroidism, BPH, coronary artery disease status post stent.   [facility]  1. End stage renal disease: with severe hyperkalemia - s/p Urgent HD for severe hyperkalemia 8/7.  - K level corrected now - Next HD later today  2. Anemia of CKD:  Hemoglobin 10.4 Will start procrit once BP is better controlled  3.  Secondary hyperparathyroidism:  Monitor phosphorus with dialysis   4. Severe HTN - patient is on NTG drip - continue irbesartan - continue clonidine  5. Peripheral neuropathy - Gabapentin to 300 qhs      LOS: 5 Zaley Talley 8/12/20179:30 AM

## 2016-04-17 NOTE — Progress Notes (Signed)
PRE HD ASSESSMENT 

## 2016-12-28 ENCOUNTER — Emergency Department: Payer: Medicaid Other

## 2016-12-28 ENCOUNTER — Inpatient Hospital Stay
Admission: EM | Admit: 2016-12-28 | Discharge: 2016-12-30 | DRG: 097 | Disposition: A | Discharge status: Home / Self Care | Payer: Medicaid Other | Attending: Internal Medicine | Admitting: Internal Medicine

## 2016-12-28 DIAGNOSIS — N400 Enlarged prostate without lower urinary tract symptoms: Secondary | ICD-10-CM | POA: Diagnosis present

## 2016-12-28 DIAGNOSIS — Z955 Presence of coronary angioplasty implant and graft: Secondary | ICD-10-CM | POA: Diagnosis not present

## 2016-12-28 DIAGNOSIS — Z992 Dependence on renal dialysis: Secondary | ICD-10-CM | POA: Diagnosis not present

## 2016-12-28 DIAGNOSIS — Z7902 Long term (current) use of antithrombotics/antiplatelets: Secondary | ICD-10-CM | POA: Diagnosis not present

## 2016-12-28 DIAGNOSIS — F341 Dysthymic disorder: Secondary | ICD-10-CM | POA: Diagnosis present

## 2016-12-28 DIAGNOSIS — D631 Anemia in chronic kidney disease: Secondary | ICD-10-CM | POA: Diagnosis present

## 2016-12-28 DIAGNOSIS — E785 Hyperlipidemia, unspecified: Secondary | ICD-10-CM | POA: Diagnosis present

## 2016-12-28 DIAGNOSIS — F17200 Nicotine dependence, unspecified, uncomplicated: Secondary | ICD-10-CM | POA: Diagnosis present

## 2016-12-28 DIAGNOSIS — R531 Weakness: Secondary | ICD-10-CM

## 2016-12-28 DIAGNOSIS — F4323 Adjustment disorder with mixed anxiety and depressed mood: Secondary | ICD-10-CM | POA: Diagnosis present

## 2016-12-28 DIAGNOSIS — R29898 Other symptoms and signs involving the musculoskeletal system: Secondary | ICD-10-CM

## 2016-12-28 DIAGNOSIS — R262 Difficulty in walking, not elsewhere classified: Secondary | ICD-10-CM | POA: Diagnosis present

## 2016-12-28 DIAGNOSIS — N2581 Secondary hyperparathyroidism of renal origin: Secondary | ICD-10-CM | POA: Diagnosis present

## 2016-12-28 DIAGNOSIS — I132 Hypertensive heart and chronic kidney disease with heart failure and with stage 5 chronic kidney disease, or end stage renal disease: Secondary | ICD-10-CM | POA: Diagnosis present

## 2016-12-28 DIAGNOSIS — Z888 Allergy status to other drugs, medicaments and biological substances status: Secondary | ICD-10-CM | POA: Diagnosis not present

## 2016-12-28 DIAGNOSIS — Z91013 Allergy to seafood: Secondary | ICD-10-CM

## 2016-12-28 DIAGNOSIS — Z79899 Other long term (current) drug therapy: Secondary | ICD-10-CM | POA: Diagnosis not present

## 2016-12-28 DIAGNOSIS — K5909 Other constipation: Secondary | ICD-10-CM | POA: Diagnosis present

## 2016-12-28 DIAGNOSIS — I25810 Atherosclerosis of coronary artery bypass graft(s) without angina pectoris: Secondary | ICD-10-CM | POA: Diagnosis present

## 2016-12-28 DIAGNOSIS — N186 End stage renal disease: Secondary | ICD-10-CM | POA: Diagnosis present

## 2016-12-28 DIAGNOSIS — G373 Acute transverse myelitis in demyelinating disease of central nervous system: Secondary | ICD-10-CM | POA: Diagnosis present

## 2016-12-28 DIAGNOSIS — G40909 Epilepsy, unspecified, not intractable, without status epilepticus: Secondary | ICD-10-CM | POA: Diagnosis present

## 2016-12-28 DIAGNOSIS — E875 Hyperkalemia: Secondary | ICD-10-CM | POA: Diagnosis present

## 2016-12-28 DIAGNOSIS — Z794 Long term (current) use of insulin: Secondary | ICD-10-CM | POA: Diagnosis not present

## 2016-12-28 DIAGNOSIS — Z833 Family history of diabetes mellitus: Secondary | ICD-10-CM

## 2016-12-28 DIAGNOSIS — I5032 Chronic diastolic (congestive) heart failure: Secondary | ICD-10-CM | POA: Diagnosis present

## 2016-12-28 DIAGNOSIS — E7800 Pure hypercholesterolemia, unspecified: Secondary | ICD-10-CM | POA: Diagnosis present

## 2016-12-28 DIAGNOSIS — Z66 Do not resuscitate: Secondary | ICD-10-CM | POA: Diagnosis present

## 2016-12-28 DIAGNOSIS — E039 Hypothyroidism, unspecified: Secondary | ICD-10-CM | POA: Diagnosis present

## 2016-12-28 DIAGNOSIS — E1122 Type 2 diabetes mellitus with diabetic chronic kidney disease: Secondary | ICD-10-CM | POA: Diagnosis present

## 2016-12-28 DIAGNOSIS — Z7982 Long term (current) use of aspirin: Secondary | ICD-10-CM

## 2016-12-28 DIAGNOSIS — E1165 Type 2 diabetes mellitus with hyperglycemia: Secondary | ICD-10-CM | POA: Diagnosis not present

## 2016-12-28 LAB — BASIC METABOLIC PANEL
Anion gap: 12 (ref 5–15)
BUN: 50 mg/dL — AB (ref 6–20)
CALCIUM: 9.2 mg/dL (ref 8.9–10.3)
CO2: 26 mmol/L (ref 22–32)
Chloride: 94 mmol/L — ABNORMAL LOW (ref 101–111)
Creatinine, Ser: 7.07 mg/dL — ABNORMAL HIGH (ref 0.61–1.24)
GFR calc Af Amer: 9 mL/min — ABNORMAL LOW (ref >60)
GFR, EST NON AFRICAN AMERICAN: 8 mL/min — AB (ref >60)
GLUCOSE: 76 mg/dL (ref 65–99)
Potassium: 5.3 mmol/L — ABNORMAL HIGH (ref 3.5–5.1)
SODIUM: 132 mmol/L — AB (ref 135–145)

## 2016-12-28 LAB — CBC WITH DIFFERENTIAL/PLATELET
BASOS ABS: 0.1 10*3/uL (ref 0–0.1)
Basophils Relative: 1 %
EOS ABS: 1.2 10*3/uL — AB (ref 0–0.7)
EOS PCT: 13 %
HCT: 27.4 % — ABNORMAL LOW (ref 40.0–52.0)
Hemoglobin: 9.5 g/dL — ABNORMAL LOW (ref 13.0–18.0)
LYMPHS PCT: 10 %
Lymphs Abs: 0.8 10*3/uL — ABNORMAL LOW (ref 1.0–3.6)
MCH: 33.4 pg (ref 26.0–34.0)
MCHC: 34.5 g/dL (ref 32.0–36.0)
MCV: 96.9 fL (ref 80.0–100.0)
Monocytes Absolute: 0.5 10*3/uL (ref 0.2–1.0)
Monocytes Relative: 6 %
Neutro Abs: 6.1 10*3/uL (ref 1.4–6.5)
Neutrophils Relative %: 70 %
PLATELETS: 114 10*3/uL — AB (ref 150–440)
RBC: 2.83 MIL/uL — AB (ref 4.40–5.90)
RDW: 17 % — ABNORMAL HIGH (ref 11.5–14.5)
WBC: 8.7 10*3/uL (ref 3.8–10.6)

## 2016-12-28 LAB — TROPONIN I: Troponin I: <0.03 ng/mL (ref <0.03)

## 2016-12-28 LAB — GLUCOSE, CAPILLARY
GLUCOSE-CAPILLARY: 102 mg/dL — AB (ref 65–99)
GLUCOSE-CAPILLARY: 113 mg/dL — AB (ref 65–99)
GLUCOSE-CAPILLARY: 253 mg/dL — AB (ref 65–99)
Glucose-Capillary: 88 mg/dL (ref 65–99)

## 2016-12-28 LAB — MRSA PCR SCREENING: MRSA by PCR: NEGATIVE

## 2016-12-28 LAB — TSH: TSH: 2.766 u[IU]/mL (ref 0.350–4.500)

## 2016-12-28 LAB — PHOSPHORUS: Phosphorus: 5.7 mg/dL — ABNORMAL HIGH (ref 2.5–4.6)

## 2016-12-28 MED ORDER — INSULIN GLARGINE 100 UNIT/ML ~~LOC~~ SOLN
5.0000 [IU] | Freq: Every day | SUBCUTANEOUS | Status: DC
  Administered 2016-12-29 – 2016-12-30 (×2): 5 [IU] via SUBCUTANEOUS
  Filled 2016-12-28 (×4): qty 0.05

## 2016-12-28 MED ORDER — CALCITRIOL 0.5 MCG PO CAPS
1.0000 ug | ORAL_CAPSULE | ORAL | Status: DC
  Administered 2016-12-29: 1 ug via ORAL
  Filled 2016-12-28: qty 2

## 2016-12-28 MED ORDER — PANTOPRAZOLE SODIUM 40 MG PO TBEC
40.0000 mg | DELAYED_RELEASE_TABLET | Freq: Every day | ORAL | Status: DC
  Administered 2016-12-29 – 2016-12-30 (×2): 40 mg via ORAL
  Filled 2016-12-28 (×2): qty 1

## 2016-12-28 MED ORDER — HEPARIN SODIUM (PORCINE) 1000 UNIT/ML DIALYSIS
1000.0000 [IU] | INTRAMUSCULAR | Status: DC | PRN
  Filled 2016-12-28: qty 1

## 2016-12-28 MED ORDER — SODIUM CHLORIDE 0.9 % IV SOLN: 100.0000 mL | INTRAVENOUS | Status: DC | PRN

## 2016-12-28 MED ORDER — ASPIRIN EC 81 MG PO TBEC
81.0000 mg | DELAYED_RELEASE_TABLET | Freq: Every day | ORAL | Status: DC
  Administered 2016-12-29 – 2016-12-30 (×2): 81 mg via ORAL
  Filled 2016-12-28 (×2): qty 1

## 2016-12-28 MED ORDER — SENNOSIDES-DOCUSATE SODIUM 8.6-50 MG PO TABS: 1.0000 | ORAL_TABLET | Freq: Every evening | ORAL | Status: DC | PRN

## 2016-12-28 MED ORDER — LIDOCAINE-PRILOCAINE 2.5-2.5 % EX CREA
1.0000 "application " | TOPICAL_CREAM | CUTANEOUS | Status: DC | PRN
  Filled 2016-12-28: qty 5

## 2016-12-28 MED ORDER — GABAPENTIN 100 MG PO CAPS
100.0000 mg | ORAL_CAPSULE | Freq: Three times a day (TID) | ORAL | Status: DC
  Administered 2016-12-28 – 2016-12-30 (×5): 100 mg via ORAL
  Filled 2016-12-28 (×5): qty 1

## 2016-12-28 MED ORDER — LEVOTHYROXINE SODIUM 50 MCG PO TABS
100.0000 ug | ORAL_TABLET | Freq: Every day | ORAL | Status: DC
  Administered 2016-12-29 – 2016-12-30 (×2): 100 ug via ORAL
  Filled 2016-12-28 (×2): qty 2

## 2016-12-28 MED ORDER — HEPARIN SODIUM (PORCINE) 5000 UNIT/ML IJ SOLN
5000.0000 [IU] | Freq: Three times a day (TID) | INTRAMUSCULAR | Status: DC
  Administered 2016-12-28 – 2016-12-30 (×6): 5000 [IU] via SUBCUTANEOUS
  Filled 2016-12-28 (×6): qty 1

## 2016-12-28 MED ORDER — FLUOXETINE HCL 20 MG PO CAPS
40.0000 mg | ORAL_CAPSULE | Freq: Every day | ORAL | Status: DC
  Administered 2016-12-29 – 2016-12-30 (×2): 40 mg via ORAL
  Filled 2016-12-28 (×2): qty 2

## 2016-12-28 MED ORDER — INSULIN ASPART 100 UNIT/ML ~~LOC~~ SOLN
0.0000 [IU] | Freq: Three times a day (TID) | SUBCUTANEOUS | Status: DC
  Administered 2016-12-29: 7 [IU] via SUBCUTANEOUS
  Administered 2016-12-29: 4 [IU] via SUBCUTANEOUS
  Administered 2016-12-29 – 2016-12-30 (×3): 15 [IU] via SUBCUTANEOUS
  Filled 2016-12-28 (×3): qty 15
  Filled 2016-12-28: qty 7
  Filled 2016-12-28: qty 4

## 2016-12-28 MED ORDER — CLOPIDOGREL BISULFATE 75 MG PO TABS
75.0000 mg | ORAL_TABLET | Freq: Every day | ORAL | Status: DC
  Administered 2016-12-28 – 2016-12-29 (×2): 75 mg via ORAL
  Filled 2016-12-28 (×2): qty 1

## 2016-12-28 MED ORDER — CLONIDINE HCL 0.1 MG PO TABS
0.1000 mg | ORAL_TABLET | Freq: Two times a day (BID) | ORAL | Status: DC
  Administered 2016-12-28 – 2016-12-30 (×4): 0.1 mg via ORAL
  Filled 2016-12-28 (×4): qty 1

## 2016-12-28 MED ORDER — ONDANSETRON HCL 4 MG/2ML IJ SOLN
4.0000 mg | Freq: Four times a day (QID) | INTRAMUSCULAR | Status: DC | PRN
  Administered 2016-12-29: 4 mg via INTRAVENOUS
  Filled 2016-12-28: qty 2

## 2016-12-28 MED ORDER — HYDRALAZINE HCL 50 MG PO TABS
100.0000 mg | ORAL_TABLET | Freq: Three times a day (TID) | ORAL | Status: DC
  Administered 2016-12-28 – 2016-12-30 (×5): 100 mg via ORAL
  Filled 2016-12-28 (×5): qty 2

## 2016-12-28 MED ORDER — IRBESARTAN 150 MG PO TABS
300.0000 mg | ORAL_TABLET | Freq: Every day | ORAL | Status: DC
  Administered 2016-12-29: 300 mg via ORAL
  Filled 2016-12-28: qty 2

## 2016-12-28 MED ORDER — LIDOCAINE HCL (PF) 1 % IJ SOLN
5.0000 mL | INTRAMUSCULAR | Status: DC | PRN
  Filled 2016-12-28: qty 5

## 2016-12-28 MED ORDER — CARBAMAZEPINE ER 200 MG PO TB12
200.0000 mg | ORAL_TABLET | Freq: Two times a day (BID) | ORAL | Status: DC
  Administered 2016-12-28 – 2016-12-30 (×4): 200 mg via ORAL
  Filled 2016-12-28 (×5): qty 1

## 2016-12-28 MED ORDER — HYDRALAZINE HCL 20 MG/ML IJ SOLN
10.0000 mg | Freq: Four times a day (QID) | INTRAMUSCULAR | Status: DC | PRN
  Administered 2016-12-28: 10 mg via INTRAVENOUS
  Filled 2016-12-28: qty 1

## 2016-12-28 MED ORDER — CALCIUM ACETATE (PHOS BINDER) 667 MG PO CAPS
667.0000 mg | ORAL_CAPSULE | Freq: Three times a day (TID) | ORAL | Status: DC
Start: 2016-12-28 — End: 2016-12-31
  Administered 2016-12-29 – 2016-12-30 (×5): 667 mg via ORAL
  Filled 2016-12-28 (×5): qty 1

## 2016-12-28 MED ORDER — PENTAFLUOROPROP-TETRAFLUOROETH EX AERO
1.0000 "application " | INHALATION_SPRAY | CUTANEOUS | Status: DC | PRN
  Filled 2016-12-28: qty 30

## 2016-12-28 MED ORDER — ONDANSETRON HCL 4 MG PO TABS: 4.0000 mg | ORAL_TABLET | Freq: Four times a day (QID) | ORAL | Status: DC | PRN

## 2016-12-28 MED ORDER — BISACODYL 5 MG PO TBEC: 5.0000 mg | DELAYED_RELEASE_TABLET | Freq: Every day | ORAL | Status: DC | PRN

## 2016-12-28 MED ORDER — AMLODIPINE BESYLATE 10 MG PO TABS
10.0000 mg | ORAL_TABLET | Freq: Every day | ORAL | Status: DC
  Administered 2016-12-29 – 2016-12-30 (×2): 10 mg via ORAL
  Filled 2016-12-28 (×2): qty 1

## 2016-12-28 MED ORDER — ACETAMINOPHEN 325 MG PO TABS
650.0000 mg | ORAL_TABLET | Freq: Four times a day (QID) | ORAL | Status: DC | PRN
  Administered 2016-12-29: 650 mg via ORAL
  Filled 2016-12-28: qty 2

## 2016-12-28 MED ORDER — SODIUM CHLORIDE 0.9% FLUSH
3.0000 mL | Freq: Two times a day (BID) | INTRAVENOUS | Status: DC
Start: 2016-12-28 — End: 2016-12-31
  Administered 2016-12-28 – 2016-12-30 (×4): 3 mL via INTRAVENOUS

## 2016-12-28 MED ORDER — ATORVASTATIN CALCIUM 20 MG PO TABS
80.0000 mg | ORAL_TABLET | Freq: Every day | ORAL | Status: DC
  Administered 2016-12-28 – 2016-12-29 (×2): 80 mg via ORAL
  Filled 2016-12-28 (×2): qty 4

## 2016-12-28 MED ORDER — SODIUM CHLORIDE 0.9 % IV SOLN: 500.0000 mg | Freq: Three times a day (TID) | INTRAVENOUS | Status: DC

## 2016-12-28 MED ORDER — TRAZODONE HCL 50 MG PO TABS
25.0000 mg | ORAL_TABLET | Freq: Every day | ORAL | Status: DC
  Administered 2016-12-28 – 2016-12-29 (×2): 25 mg via ORAL
  Filled 2016-12-28 (×3): qty 1

## 2016-12-28 MED ORDER — ISOSORBIDE MONONITRATE ER 60 MG PO TB24
90.0000 mg | ORAL_TABLET | Freq: Every day | ORAL | Status: DC
  Administered 2016-12-29 – 2016-12-30 (×2): 90 mg via ORAL
  Filled 2016-12-28 (×2): qty 1

## 2016-12-28 MED ORDER — CARVEDILOL 25 MG PO TABS
25.0000 mg | ORAL_TABLET | Freq: Two times a day (BID) | ORAL | Status: DC
  Administered 2016-12-28 – 2016-12-30 (×4): 25 mg via ORAL
  Filled 2016-12-28 (×4): qty 1

## 2016-12-28 MED ORDER — ALTEPLASE 2 MG IJ SOLR: 2.0000 mg | Freq: Once | INTRAMUSCULAR | Status: DC | PRN

## 2016-12-28 MED ORDER — ACETAMINOPHEN 650 MG RE SUPP: 650.0000 mg | Freq: Four times a day (QID) | RECTAL | Status: DC | PRN

## 2016-12-28 MED ORDER — DOXAZOSIN MESYLATE 1 MG PO TABS
1.0000 mg | ORAL_TABLET | Freq: Every day | ORAL | Status: DC
  Administered 2016-12-28 – 2016-12-29 (×2): 1 mg via ORAL
  Filled 2016-12-28 (×3): qty 1

## 2016-12-28 MED ORDER — LABETALOL HCL 100 MG PO TABS
300.0000 mg | ORAL_TABLET | Freq: Two times a day (BID) | ORAL | Status: DC
  Administered 2016-12-28 – 2016-12-30 (×4): 300 mg via ORAL
  Filled 2016-12-28 (×4): qty 3

## 2016-12-28 MED ORDER — HYDROCODONE-ACETAMINOPHEN 5-325 MG PO TABS
1.0000 | ORAL_TABLET | ORAL | Status: DC | PRN
  Administered 2016-12-28 – 2016-12-29 (×2): 2 via ORAL
  Filled 2016-12-28 (×2): qty 2

## 2016-12-28 MED ORDER — SODIUM CHLORIDE 0.9 % IV SOLN
500.0000 mg | Freq: Three times a day (TID) | INTRAVENOUS | Status: DC
  Administered 2016-12-28 – 2016-12-30 (×7): 500 mg via INTRAVENOUS
  Filled 2016-12-28 (×9): qty 4

## 2016-12-28 NOTE — ED Notes (Signed)
Patient transported to MRI 

## 2016-12-28 NOTE — ED Notes (Signed)
To MRI via stretcher.  Awake and alert.  NAD

## 2016-12-28 NOTE — Consult Note (Signed)
Psychiatry: I was notified by Dr. Juliene Pina that a "stat" consult was requested. Reported reason for the consult is that the patient on screening questions reported that he had had suicidal thoughts 2 weeks ago. He is currently reportedly denying any suicidal ideation and his current behavior suggests a strong wish to get treatment. Nevertheless apparently this mandates a automatic psychiatric consult. I came to the unit within the required time frame and found that the patient had been taken down to dialysis. In my experience it is impossible for me to conduct a psychiatric consultation with a patient who is receiving hemodialysis and is in the dialysis suite. I am going to assume that if he is being allowed to go to dialysis,  an area where the risks of self injury are actually elevated, that the concern about suicidal behavior must not be extremely acute. Therefore I will treat this as a "routine" consult and plan to see the patient tomorrow.

## 2016-12-28 NOTE — ED Provider Notes (Signed)
[facility] Emergency Department Provider Note  ____________________________________________  Time seen: Approximately 7:15 AM  I have reviewed the triage vital signs and the nursing notes.   HISTORY  Chief Complaint Extremity Weakness   HPI Marcus [name] is a 51 y.o. male with a history of ESRD on HD (TTS), CAD status post stents, CHF, diabetes, hypertension, seizure disorder, subdural hematoma presents for evaluation of bilateral lower extremity weakness. Patient reports that he was in his usual state of health yesterday evening when he went to bed. This morning when he woke up both his legs were weak and he has been unable to walk. His family members carried him to the dialysisshuttle and upon arrival to the dialysis center patient was taken inside on a wheelchair. When he was evaluated by the dialysis nurse were concerned by the fact the patient had weakness in his legs and he was sent to the emergency room for further evaluation. Patient denies headache or head trauma, back pain or back trauma, saddle anesthesia, numbness or paresthesias of his lower extremities, abdominal pain, chest pain, shortness of breath, fever or chills, diarrhea or constipation. Patient denies being sick recently. He has never had problems with his legs before.  Past Medical History:  Diagnosis Date  . BPH (benign prostatic hyperplasia)   . CAD (coronary artery disease) of artery bypass graft    a. s/p multipe PCI to LCx and OMI; Last cath in 07/2014 w/ POBA to 70% stenosis in LCx   . Chronic diastolic CHF (congestive heart failure) (HCC)    a. EF 55-60% by echo in 08/2015; Grade 2 DD noted.  . Depression   . Diabetes mellitus without complication (HCC)   . Dialysis patient [facility])    a. Tue/Th/Sat Schedule  . High cholesterol   . Hypertension   . Renal disorder   . Thyroid disease     Patient Active Problem List   Diagnosis Date Noted  . Adjustment disorder with mixed  anxiety and depressed mood 12/29/2016  . Dysthymia 12/29/2016  . Weakness 12/28/2016  . Hyperkalemia 04/02/2016  . CAD in native artery 10/22/2015  . Chronic diastolic heart failure (HCC) 10/22/2015  . Syncope 10/22/2015  . ESRD (end stage renal disease) on dialysis (HCC) 10/22/2015  . CAP (community acquired pneumonia) 10/22/2015  . Aspiration pneumonia of right lung (HCC)   . Hyperglycemia   . Syncope and collapse   . Hypoxia 10/21/2015    Past Surgical History:  Procedure Laterality Date  . CARDIAC CATHETERIZATION    . DIALYSIS FISTULA CREATION      Prior to Admission medications   Medication Sig Start Date End Date Taking? Authorizing Provider  amLODipine (NORVASC) 10 MG tablet Take 10 mg by mouth daily.   Yes Historical Provider, MD  aspirin EC 81 MG tablet Take 81 mg by mouth daily.   Yes Historical Provider, MD  atorvastatin (LIPITOR) 80 MG tablet Take 80 mg by mouth at bedtime.   Yes Historical Provider, MD  calcitRIOL (ROCALTROL) 0.5 MCG capsule Take 1 mcg by mouth 3 (three) times a week. Take 1 mcg by mouth 3 (three) times a week with dialysis. 10/04/16 10/04/17 Yes Historical Provider, MD  calcium acetate (PHOSLO) 667 MG capsule Take 667 mg by mouth 3 (three) times daily with meals.    Yes Historical Provider, MD  carbamazepine (CARBATROL) 200 MG 12 hr capsule Take 200 mg by mouth 2 (two) times daily. 12/17/16 12/17/17 Yes Historical Provider, MD  carvedilol (COREG) 25  MG tablet Take 25 mg by mouth 2 (two) times daily.   Yes Historical Provider, MD  cloNIDine (CATAPRES) 0.1 MG tablet Take 0.1 mg by mouth 2 (two) times daily.   Yes Historical Provider, MD  clopidogrel (PLAVIX) 75 MG tablet Take 75 mg by mouth at bedtime.   Yes Historical Provider, MD  doxazosin (CARDURA) 1 MG tablet Take 1 mg by mouth at bedtime.   Yes Historical Provider, MD  FLUoxetine (PROZAC) 40 MG capsule Take 40 mg by mouth daily.   Yes Historical Provider, MD  gabapentin (NEURONTIN) 100 MG capsule Take  100 mg by mouth 3 (three) times daily.    Yes Historical Provider, MD  hydrALAZINE (APRESOLINE) 100 MG tablet Take 1 tablet (100 mg total) by mouth 3 (three) times daily. 04/17/16  Yes Sital Mody, MD  insulin NPH Human (HUMULIN N,NOVOLIN N) 100 UNIT/ML injection Inject 5-10 Units into the skin 2 (two) times daily. 5 units in the morning and 10 units at night. 09/21/16  Yes Historical Provider, MD  isosorbide mononitrate (IMDUR) 60 MG 24 hr tablet Take 90 mg by mouth daily. 12/14/16  Yes Historical Provider, MD  levothyroxine (SYNTHROID, LEVOTHROID) 100 MCG tablet Take 100 mcg by mouth daily before breakfast.   Yes Historical Provider, MD  nitroGLYCERIN (NITROSTAT) 0.4 MG SL tablet Take 0.4 mg by mouth every 5 (five) minutes as needed. 10/24/12  Yes Historical Provider, MD  omeprazole (PRILOSEC) 40 MG capsule Take 40 mg by mouth daily.   Yes Historical Provider, MD  traZODone (DESYREL) 50 MG tablet Take 25 mg by mouth at bedtime.   Yes Historical Provider, MD  insulin aspart protamine- aspart (NOVOLOG MIX 70/30) (70-30) 100 UNIT/ML injection Inject 0.05 mLs (5 Units total) into the skin daily with breakfast. Patient not taking: Reported on 12/28/2016 10/24/15   Wyatt Haste, MD  insulin aspart protamine- aspart (NOVOLOG MIX 70/30) (70-30) 100 UNIT/ML injection Inject 0.1 mLs (10 Units total) into the skin daily with supper. Patient not taking: Reported on 12/28/2016 10/24/15   Wyatt Haste, MD  irbesartan (AVAPRO) 300 MG tablet Take 1 tablet (300 mg total) by mouth daily after supper. Patient not taking: Reported on 12/28/2016 04/17/16   Adrian Saran, MD  labetalol (NORMODYNE) 300 MG tablet Take 1 tablet (300 mg total) by mouth 2 (two) times daily. Patient not taking: Reported on 12/28/2016 04/17/16   Adrian Saran, MD    Allergies Eicosapentaenoic acid (epa) and Fish-derived products  History reviewed. No pertinent family history.  Social History Social History  Substance Use Topics  . Smoking status:  Former Games developer  . Smokeless tobacco: Never Used  . Alcohol use No    Review of Systems  Constitutional: Negative for fever. Eyes: Negative for visual changes. ENT: Negative for sore throat. Neck: No neck pain  Cardiovascular: Negative for chest pain. Respiratory: Negative for shortness of breath. Gastrointestinal: Negative for abdominal pain, vomiting or diarrhea. Genitourinary: Negative for dysuria. Musculoskeletal: Negative for back pain. Skin: Negative for rash. Neurological: Negative for headaches,  numbness. + b/l LE weakness Psych: No SI or HI  ____________________________________________   PHYSICAL EXAM:  VITAL SIGNS: ED Triage Vitals  Enc Vitals Group     BP 12/28/16 0621 (!) 145/67     Pulse Rate 12/28/16 0621 60     Resp 12/28/16 0621 16     Temp --      Temp src --      SpO2 12/28/16 0621 96 %  Weight 12/28/16 0622 164 lb (74.4 kg)     Height 12/28/16 0622  (1.651 m)     Head Circumference --      Peak Flow --      Pain Score --      Pain Loc --      Pain Edu? --      Excl. in GC? --     Constitutional: Alert and oriented. Well appearing and in no apparent distress. HEENT:      Head: Normocephalic and atraumatic.         Eyes: Conjunctivae are normal. Sclera is non-icteric. EOMI. PERRL      Mouth/Throat: Mucous membranes are moist.       Neck: Supple with no signs of meningismus. Cardiovascular: Regular rate and rhythm. No murmurs, gallops, or rubs. 2+ symmetrical distal pulses are present in all extremities. No JVD. Respiratory: Normal respiratory effort. Lungs are clear to auscultation bilaterally. No wheezes, crackles, or rhonchi.  Gastrointestinal: Soft, non tender, and non distended with positive bowel sounds. No rebound or guarding. Genitourinary: No CVA tenderness. Musculoskeletal: Nontender with normal range of motion in all extremities. No edema, cyanosis, or erythema of extremities. No midline C/T/L spine Neurologic: Normal speech and  language. A & O x3, PERRL, no nystagmus, CN II-XII intact, motor testing reveals good tone and bulk throughout. There is no evidence of pronator drift or dysmetria on FNF. B/l dysmetria on heel to shin. Muscle strength is 5/5 on upper extremities and 4/5 on b/l LE. Unable to elicit DTRs on b/l LE patella and Achilles and b/l biceps. Sensory examination is intact. Gait deerred Skin: Skin is warm, dry and intact. No rash noted. Psychiatric: Mood and affect are normal. Speech and behavior are normal.  ____________________________________________   LABS (all labs ordered are listed, but only abnormal results are displayed)  Labs Reviewed  CBC WITH DIFFERENTIAL/PLATELET - Abnormal; Notable for the following:       Result Value   RBC 2.83 (*)    Hemoglobin 9.5 (*)    HCT 27.4 (*)    RDW 17.0 (*)    Platelets 114 (*)    Lymphs Abs 0.8 (*)    Eosinophils Absolute 1.2 (*)    All other components within normal limits  BASIC METABOLIC PANEL - Abnormal; Notable for the following:    Sodium 132 (*)    Potassium 5.3 (*)    Chloride 94 (*)    BUN 50 (*)    Creatinine, Ser 7.07 (*)    GFR calc non Af Amer 8 (*)    GFR calc Af Amer 9 (*)    All other components within normal limits  GLUCOSE, CAPILLARY - Abnormal; Notable for the following:    Glucose-Capillary 113 (*)    All other components within normal limits  PHOSPHORUS - Abnormal; Notable for the following:    Phosphorus 5.7 (*)    All other components within normal limits  PARATHYROID HORMONE, INTACT (NO CA) - Abnormal; Notable for the following:    PTH 86 (*)    All other components within normal limits  BASIC METABOLIC PANEL - Abnormal; Notable for the following:    Chloride 95 (*)    Glucose, Bld 292 (*)    BUN 33 (*)    Creatinine, Ser 4.85 (*)    Calcium 8.7 (*)    GFR calc non Af Amer 13 (*)    GFR calc Af Amer 15 (*)    All other components  within normal limits  CBC - Abnormal; Notable for the following:    RBC 3.01 (*)      Hemoglobin 9.9 (*)    HCT 28.9 (*)    RDW 16.7 (*)    Platelets 109 (*)    All other components within normal limits  GLUCOSE, CAPILLARY - Abnormal; Notable for the following:    Glucose-Capillary 102 (*)    All other components within normal limits  GLUCOSE, CAPILLARY - Abnormal; Notable for the following:    Glucose-Capillary 253 (*)    All other components within normal limits  GLUCOSE, CAPILLARY - Abnormal; Notable for the following:    Glucose-Capillary 300 (*)    All other components within normal limits  GLUCOSE, CAPILLARY - Abnormal; Notable for the following:    Glucose-Capillary 302 (*)    All other components within normal limits  GLUCOSE, CAPILLARY - Abnormal; Notable for the following:    Glucose-Capillary 243 (*)    All other components within normal limits  GLUCOSE, CAPILLARY - Abnormal; Notable for the following:    Glucose-Capillary 189 (*)    All other components within normal limits  GLUCOSE, CAPILLARY - Abnormal; Notable for the following:    Glucose-Capillary 236 (*)    All other components within normal limits  MRSA PCR SCREENING  TROPONIN I  GLUCOSE, CAPILLARY  HIV ANTIBODY (ROUTINE TESTING)  TSH  HEPATITIS B SURFACE ANTIGEN  VITAMIN B12  LEVETIRACETAM LEVEL  CBG MONITORING, ED   ____________________________________________  EKG  ED ECG REPORT I, Nita Sickle, the attending physician, personally viewed and interpreted this ECG.  Normal sinus rhythm, rate of 60, normal intervals, right axis deviation, no ST elevations or depressions, T wave inversion in III  ____________________________________________  RADIOLOGY  Head CT: No acute finding or change from prior.  MRI lumbar spine:1. Moderate central and bilateral foraminal stenosis at L3-4 secondary to a broad-based disc protrusion and central annular tear in addition to bilateral moderate facet hypertrophy. 2. Mild left subarticular and foraminal narrowing at L4-5.  MRI thoracic  spine:1. Widespread abnormal signal within the substance of the thoracic spinal cord, appears to most affect the central gray matter, and is most apparent from T7 to the conus. No cord expansion, discrete cord tumor or spinal stenosis. Top differential considerations include spinal cord infarct and inflammatory myelitis. 2. No thoracic spinal stenosis or acute osseous abnormality. 3. Small layering and loculated bilateral pleural effusions. 4. 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  MRI brain: 1. No acute intracranial abnormality. 2. 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 3. See thoracic spine MRI today reported separately. ____________________________________________   PROCEDURES  Procedure(s) performed: None Procedures Critical Care performed:  None ____________________________________________   INITIAL IMPRESSION / ASSESSMENT AND PLAN / ED COURSE   51 y.o. male with a history of ESRD on HD (TTS), CAD status post stents, CHF, diabetes, hypertension, seizure disorder, subdural hematoma presents for evaluation of bilateral lower extremity weakness since this am. Patient able to lift both legs of the bed with drift and difficulty, normal sensation, no DTRs throughout including b/l UE, no ttp over C/T/L spine midline ttp. Even though patient has b/l LE weakness and absent reflexes, reflexes are also absent on b/l UE, and patient has no back pain making cauda equina unlikely. Will check rectal tone. Will check labs to rule dehydration, electrolyte abnormalities, hypoglycemia, DKA as cause of weakness. GBS also possible with absent DTRs.   Clinical Course as of Dec 29 2341  Tue Dec 28, 2016  0815 Labs with no acute findings. Patient continues to have weakness. MRI of lumbar spine ordered  [CV]  1027 MRI lumbar spine showing Moderate central and bilateral foraminal stenosis at L3-4 secondary to a broad-based disc protrusion and central annular tear in addition to  bilateral moderate facet hypertrophy.   Rectal tone intact. Patient unable to ambulate requiring 2 people on each side and had significant difficulty. Discussed with Dr. Adriana Simas, Neurosourgery on call who looked at the MRI and recommended MRI thoracic and brain as MRI lumbar does not justify the findings on exam.   [CV]  1236 Spoke with Radiologist who sees abnormal cord signal in the thoracic spine concerning for either long segment of cord infarction vs transverse myelitis vs GBS although less likely. Will consult Neurology  [CV]  1255 Spoke with Dr. Loretha Brasil who agrees with differential and recommended starting patient on solumedrol  TID. Patient will be admitted for further management.  [CV]    Clinical Course User Index [CV] Nita Sickle, MD    Pertinent labs & imaging results that were available during my care of the patient were reviewed by me and considered in my medical decision making (see chart for details).    ____________________________________________   FINAL CLINICAL IMPRESSION(S) / ED DIAGNOSES  Final diagnoses:  Weakness of lower extremity  Bilateral leg weakness  Acute transverse myelitis (HCC)      NEW MEDICATIONS STARTED DURING THIS VISIT:  Current Discharge Medication List       Note:  This document was prepared using Dragon voice recognition software and may include unintentional dictation errors.    Nita Sickle, MD 12/29/16 [phone]

## 2016-12-28 NOTE — ED Notes (Signed)
Patient denies pain and is resting comfortably.  

## 2016-12-28 NOTE — ED Notes (Signed)
Return  from MRI via stretcher.  AAOx3.  Skin warm and dry.  NAD

## 2016-12-28 NOTE — ED Triage Notes (Signed)
Pt was going  to go to dialysis this morning and states his legs felt weak and "gave out" and wanted to be checked out. States this has never happened before. Pt alert and oriented

## 2016-12-28 NOTE — H&P (Signed)
[employer] [facility]   PATIENT NAME: Marcus [name]    MR#:  [id number]  DATE OF BIRTH:  [birth year]  DATE OF ADMISSION:  12/28/2016  PRIMARY CARE PHYSICIAN: [facility] [place]   REQUESTING/REFERRING PHYSICIAN: Dr Don Perking  CHIEF COMPLAINT:   Weakness lower legs HISTORY OF PRESENT ILLNESS:  Marcus [name]  is a 51 y.o. male with a known history of ESRD on hemodialysis, CAD status post stents and diabetes who presents with bilateral lower extremity weakness. Patient reports that he has been in his usual state of health. This morning when he woke up both of his legs were weak and he was unable to walk. He went to dialysis and upon arrival to the dialysis center patient was evaluated rather dialysis nurse and due to his profound bilateral lower extremity weakness was sent to the emergency room for further evaluation. A she denies any recent falls, several anesthesia or numbness or tingling of lower extremities. No bowel or bladder incontinence.  He was evaluated by neurosurgery while in the emergency room and case was discussed by the ER physician to the neurologist. MRI of the lumbar spine showing possible thoracic central cord infarct versus transverse myelitis. Neurologist recommended starting Solu-Medrol 500 mg IV 3 times a day. Currently patient is not having any symptoms of shortness of breath or breathing issues.  When the neurosurgeon came to evaluate the patient emergency room it appeared that patient had full strength of his lower extremity. It is questionable if patient's blood pressure was low causing a thoracic central cord infarct. He has also received 500 mg of IV Solu-Medrol.  PAST MEDICAL HISTORY:   Past Medical History:  Diagnosis Date  . BPH (benign prostatic hyperplasia)   . CAD (coronary artery disease) of artery bypass graft    a. s/p multipe PCI to LCx and OMI; Last cath in 07/2014 w/ POBA to 70% stenosis in LCx   . Chronic  diastolic CHF (congestive heart failure) (HCC)    a. EF 55-60% by echo in 08/2015; Grade 2 DD noted.  . Depression   . Diabetes mellitus without complication (HCC)   . Dialysis patient [facility])    a. Tue/Th/Sat Schedule  . High cholesterol   . Hypertension   . Renal disorder   . Thyroid disease     PAST SURGICAL HISTORY:   Past Surgical History:  Procedure Laterality Date  . CARDIAC CATHETERIZATION    . DIALYSIS FISTULA CREATION      SOCIAL HISTORY:   Social History  Substance Use Topics  . Smoking status: Current Some Day Smoker  . Smokeless tobacco: Never Used  . Alcohol use No    FAMILY HISTORY:   Diabetes DRUG ALLERGIES:   Allergies  Allergen Reactions  . Eicosapentaenoic Acid (Epa) Rash  . Fish-Derived Products Rash    REVIEW OF SYSTEMS:   Review of Systems  Constitutional: Negative.  Negative for chills, fever and malaise/fatigue.  HENT: Negative.  Negative for ear discharge, ear pain, hearing loss, nosebleeds and sore throat.   Eyes: Negative.  Negative for blurred vision and pain.  Respiratory: Negative.  Negative for cough, hemoptysis, shortness of breath and wheezing.   Cardiovascular: Negative.  Negative for chest pain, palpitations and leg swelling.  Gastrointestinal: Negative.  Negative for abdominal pain, blood in stool, diarrhea, nausea and vomiting.  Genitourinary: Negative.  Negative for dysuria.  Musculoskeletal: Negative.  Negative for back pain.  Skin: Negative.   Neurological: Positive for focal weakness (  Bilateral lower extremity). Negative for dizziness, tremors, speech change, seizures and headaches.  Endo/Heme/Allergies: Negative.  Does not bruise/bleed easily.  Psychiatric/Behavioral: Negative.  Negative for depression, hallucinations and suicidal ideas.    MEDICATIONS AT HOME:   Prior to Admission medications   Medication Sig Start Date End Date Taking? Authorizing Provider  amLODipine (NORVASC) 10 MG tablet Take 10 mg by mouth  daily.   Yes Historical Provider, MD  aspirin EC 81 MG tablet Take 81 mg by mouth daily.   Yes Historical Provider, MD  atorvastatin (LIPITOR) 80 MG tablet Take 80 mg by mouth at bedtime.   Yes Historical Provider, MD  calcitRIOL (ROCALTROL) 0.5 MCG capsule Take 1 mcg by mouth 3 (three) times a week. Take 1 mcg by mouth 3 (three) times a week with dialysis. 10/04/16 10/04/17 Yes Historical Provider, MD  calcium acetate (PHOSLO) 667 MG capsule Take 667 mg by mouth 3 (three) times daily with meals.    Yes Historical Provider, MD  carbamazepine (CARBATROL) 200 MG 12 hr capsule Take 200 mg by mouth 2 (two) times daily. 12/17/16 12/17/17 Yes Historical Provider, MD  carvedilol (COREG) 25 MG tablet Take 25 mg by mouth 2 (two) times daily.   Yes Historical Provider, MD  cloNIDine (CATAPRES) 0.1 MG tablet Take 0.1 mg by mouth 2 (two) times daily.   Yes Historical Provider, MD  clopidogrel (PLAVIX) 75 MG tablet Take 75 mg by mouth at bedtime.   Yes Historical Provider, MD  doxazosin (CARDURA) 1 MG tablet Take 1 mg by mouth at bedtime.   Yes Historical Provider, MD  FLUoxetine (PROZAC) 40 MG capsule Take 40 mg by mouth daily.   Yes Historical Provider, MD  gabapentin (NEURONTIN) 100 MG capsule Take 100 mg by mouth 3 (three) times daily.    Yes Historical Provider, MD  hydrALAZINE (APRESOLINE) 100 MG tablet Take 1 tablet (100 mg total) by mouth 3 (three) times daily. 04/17/16  Yes Marva Hendryx, MD  insulin NPH Human (HUMULIN N,NOVOLIN N) 100 UNIT/ML injection Inject 5-10 Units into the skin 2 (two) times daily. 5 units in the morning and 10 units at night. 09/21/16  Yes Historical Provider, MD  isosorbide mononitrate (IMDUR) 60 MG 24 hr tablet Take 90 mg by mouth daily. 12/14/16  Yes Historical Provider, MD  levothyroxine (SYNTHROID, LEVOTHROID) 100 MCG tablet Take 100 mcg by mouth daily before breakfast.   Yes Historical Provider, MD  nitroGLYCERIN (NITROSTAT) 0.4 MG SL tablet Take 0.4 mg by mouth every 5 (five)  minutes as needed. 10/24/12  Yes Historical Provider, MD  omeprazole (PRILOSEC) 40 MG capsule Take 40 mg by mouth daily.   Yes Historical Provider, MD  traZODone (DESYREL) 50 MG tablet Take 25 mg by mouth at bedtime.   Yes Historical Provider, MD  insulin aspart protamine- aspart (NOVOLOG MIX 70/30) (70-30) 100 UNIT/ML injection Inject 0.05 mLs (5 Units total) into the skin daily with breakfast. Patient not taking: Reported on 12/28/2016 10/24/15   Wyatt Haste, MD  insulin aspart protamine- aspart (NOVOLOG MIX 70/30) (70-30) 100 UNIT/ML injection Inject 0.1 mLs (10 Units total) into the skin daily with supper. Patient not taking: Reported on 12/28/2016 10/24/15   Wyatt Haste, MD  irbesartan (AVAPRO) 300 MG tablet Take 1 tablet (300 mg total) by mouth daily after supper. Patient not taking: Reported on 12/28/2016 04/17/16   Adrian Saran, MD  labetalol (NORMODYNE) 300 MG tablet Take 1 tablet (300 mg total) by mouth 2 (two) times daily. Patient not taking:  Reported on 12/28/2016 04/17/16   Adrian Saran, MD      VITAL SIGNS:  Blood pressure (!) 175/71, pulse (!) 59, resp. rate 17, height  (1.651 m), weight 74.4 kg (164 lb), SpO2 94 %.  PHYSICAL EXAMINATION:   Physical Exam  Constitutional: He is oriented to person, place, and time and well-developed, well-nourished, and in no distress. No distress.  HENT:  Head: Normocephalic.  Eyes: No scleral icterus.  Neck: Normal range of motion. Neck supple. No JVD present. No tracheal deviation present.  Cardiovascular: Normal rate, regular rhythm and normal heart sounds.  Exam reveals no gallop and no friction rub.   No murmur heard. Pulmonary/Chest: Effort normal and breath sounds normal. No respiratory distress. He has no wheezes. He has no rales. He exhibits no tenderness.  Abdominal: Soft. Bowel sounds are normal. He exhibits no distension and no mass. There is no tenderness. There is no rebound and no guarding.  Musculoskeletal: Normal range of  motion. He exhibits no edema.  Neurological: He is alert and oriented to person, place, and time.  4/5 B/l LE No sensory defecits Unable to illicit achilles reflex Gait not yet tested UE b/l 5/5   Skin: Skin is warm. No rash noted. No erythema.  Psychiatric: Affect and judgment normal.      LABORATORY PANEL:   CBC  Recent Labs Lab 12/28/16 0627  WBC 8.7  HGB 9.5*  HCT 27.4*  PLT 114*   ------------------------------------------------------------------------------------------------------------------  Chemistries   Recent Labs Lab 12/28/16 0627  NA 132*  K 5.3*  CL 94*  CO2 26  GLUCOSE 76  BUN 50*  CREATININE 7.07*  CALCIUM 9.2   ------------------------------------------------------------------------------------------------------------------  Cardiac Enzymes  Recent Labs Lab 12/28/16 0627  TROPONINI <0.03   ------------------------------------------------------------------------------------------------------------------  RADIOLOGY:  Ct Head Wo Contrast  Result Date: 12/28/2016 CLINICAL DATA:  Syncope. Initial encounter. EXAM: CT HEAD WITHOUT CONTRAST TECHNIQUE: Contiguous axial images were obtained from the base of the skull through the vertex without intravenous contrast. COMPARISON:  04/12/2016 FINDINGS: Brain: No evidence of acute infarction, hemorrhage, hydrocephalus, extra-axial collection or mass lesion/mass effect. Mild cerebral volume loss, stable. Vascular: Atherosclerotic calcification.  No acute finding. Skull: Chronic scattered scalp thickening. No definitive contusion. Negative for skull fracture. Sinuses/Orbits: No evidence of injury. IMPRESSION: No acute finding or change from prior. Electronically Signed   By: Marnee Spring M.D.   On: 12/28/2016 08:31   Mr Brain Wo Contrast  Result Date: 12/28/2016 CLINICAL DATA:  51 year old male with sudden onset of bilateral leg weakness. The patient currently is able to stand, and can slightly lift his  legs against gravity, but is too weak to walk independently. Symptoms upon waking today, reportedly was normal yesterday. Dialysis patient. EXAM: MRI HEAD WITHOUT CONTRAST TECHNIQUE: Multiplanar, multiecho pulse sequences of the brain and surrounding structures were obtained without intravenous contrast. COMPARISON:  Thoracic and lumbar spine MRI from today reported separately. Head CT without contrast 0809 hours today. FINDINGS: Brain: No restricted diffusion to suggest acute infarction. No midline shift, mass effect, evidence of mass lesion, ventriculomegaly, extra-axial collection or acute intracranial hemorrhage. Cervicomedullary junction and pituitary are within normal limits. Moderate size chronic lacunar infarct of the right paracentral pons. Lower brainstem and cervicomedullary junction appear within normal limits. Elsewhere gray and white matter signal are within normal limits. No chronic cerebral blood products. Vascular: Major intracranial vascular flow voids are preserved. Skull and upper cervical spine: Appear within normal limits. Normal bone marrow signal. Sinuses/Orbits: Postoperative changes to both globes.  Trace paranasal sinus mucosal thickening. Other: Mastoids are clear.  Negative scalp soft tissues. IMPRESSION: 1.  No acute intracranial abnormality. 2. 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 3. See thoracic spine MRI today reported separately. Electronically Signed   By: Odessa Fleming M.D.   On: 12/28/2016 12:42   Mr Thoracic Spine Wo Contrast  Result Date: 12/28/2016 CLINICAL DATA:  51 year old male with sudden onset of bilateral leg weakness. The patient currently is able to stand, and can slightly lift his legs against gravity, but is too weak to walk independently. Symptoms upon waking today, reportedly was normal yesterday. Dialysis patient. EXAM: MRI THORACIC SPINE WITHOUT CONTRAST TECHNIQUE: Multiplanar, multisequence MR imaging of the thoracic spine was performed. No  intravenous contrast was administered. COMPARISON:  Lumbar MRI from today reported separately. FINDINGS: Limited sagittal imaging the cervical spine: Negative; suggestion of cervical spinal stenosis. Thoracic spine segmentation: Normal, and appears concordant with the lumbar study today reported separately. Alignment: Within normal limits, mild straightening of thoracic kyphosis. Vertebrae: No marrow edema or evidence of acute osseous abnormality. Visualized bone marrow signal is within normal limits. Cord: There is no thoracic spinal cord expansion, and is difficult to discern abnormal spinal cord signal on sagittal T1 or T2 weighted images, however, on axial T2 (series 8, image 28) and sagittal STIR imaging there is widespread abnormal T2 and STIR signal throughout the thoracic spinal cord. The signal appears primarily within the central gray matter, but does not resemble a syrinx. The signal abnormality is most pronounced below the T7 vertebral body, and appears nearly holo cord just above the conus which is at T12. There is no associated thoracic spinal canal stenosis or cord mass effect. No abnormal flow voids within the thoracic thecal sac. Paraspinal and other soft tissues: Small layering and loculated bilateral pleural effusions. Otherwise negative visualized thoracic viscera. Visible upper abdominal viscera remarkable for decreased T2 signal in both the liver and spleen such as due to hemosiderosis. Disc levels: Mild for age thoracic spine degeneration. Minimal T7-T8 disc bulging. No other significant thoracic disc disease. Intermittent thoracic facet hypertrophy, but no thoracic spinal stenosis. IMPRESSION: 1. Widespread abnormal signal within the substance of the thoracic spinal cord, appears to most affect the central gray matter, and is most apparent from T7 to the conus. No cord expansion, discrete cord tumor or spinal stenosis. Top differential considerations include spinal cord infarct and  inflammatory myelitis. 2. No thoracic spinal stenosis or acute osseous abnormality. 3. Small layering and loculated bilateral pleural effusions. 4. 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 5. Study discussed by telephone with Dr. Nita Sickle on 12/28/2016 at 1235 hours. Electronically Signed   By: Odessa Fleming M.D.   On: 12/28/2016 12:40   Mr Lumbar Spine Wo Contrast  Result Date: 12/28/2016 CLINICAL DATA:  Sudden onset of bilateral lower extremity weakness. Legs gave out. EXAM: MRI LUMBAR SPINE WITHOUT CONTRAST TECHNIQUE: Multiplanar, multisequence MR imaging of the lumbar spine was performed. No intravenous contrast was administered. COMPARISON:  None. FINDINGS: Segmentation: 5 non rib-bearing lumbar type vertebral bodies are present. Alignment: AP alignment is anatomic. Leftward curvature is centered at L2-3. Vertebrae: Mild endplate marrow changes are noted anteriorly at L2 and L3. Vertebral body heights and marrow signal are otherwise normal. Conus medullaris: Extends to the L1 level and appears normal. Paraspinal and other soft tissues: Limited imaging of the abdomen demonstrates marked bilateral renal atrophy and multiple small cysts, compatible with chronic dialysis. No other discrete lesions are present.  There is no significant adenopathy. Disc levels: L1-2:  Negative. L2-3:  Mild disc bulging is present. L3-4: A broad-based disc protrusion and central annular tear present. Moderate facet hypertrophy is noted bilaterally. This results in moderate central and bilateral foraminal stenosis. L4-5: A leftward disc bulge is present. Facet hypertrophy is worse left than right. Mild left subarticular and foraminal narrowing is present. L5-S1: Mild facet hypertrophy is worse on the left. There is no significant stenosis. IMPRESSION: 1. Moderate central and bilateral foraminal stenosis at L3-4 secondary to a broad-based disc protrusion and central annular tear in addition to bilateral moderate facet  hypertrophy. 2. Mild left subarticular and foraminal narrowing at L4-5. Electronically Signed   By: Marin Roberts M.D.   On: 12/28/2016 10:09    EKG:   Sinus rhythm no ST elevation  IMPRESSION AND PLAN:   51 year old male with ESRD on hemodialysis presents with bilateral lower extremity weakness  1. Bilateral lower extremity weakness with differential including transverse myelitis versus spinal cord infarct from hypotension. Continue Solu-Medrol 500 mg IV 3 times a day. Neurology consultation. Neuro checks every 4 hours Monitor blood pressure Monitor signs and symptoms of respiratory compromise if this is transverse myelitis.  2. ESRD and hemodialysis: Consult nephrology for dialysis  3. Hyperkalemia: Patient will need dialysis  4 Diabetes: Patient will need high-dose sliding scale insulin with diabetes coordinator consultation.  5. Essential hypertension: Continue labetalol, Imdur, Norvasc Avapro, hydralazine, clonidine, Cardura and Coreg  6. Hyperlipidemia: Continue statin  7. Depression: Continue Prozac  8. Hypothyroid: Continue Synthroid  9. CAD: Continue aspirin, beta blocker, statin and Plavix All the records are reviewed and case discussed with ED provider. Management plans discussed with the patient and he is in agreement  CODE STATUS: DNR  TOTAL TIME TAKING CARE OF THIS PATIENT: 45 minutes.    Takahiro Godinho M.D on 12/28/2016 at 1:45 PM  Between 7am to 6pm - Pager - [phone]  After 6pm go to [email or web address] - Social research officer, government  [facility]  Office  [phone]  CC: Primary care physician; [facility]

## 2016-12-28 NOTE — Progress Notes (Signed)
Patient was not in the room when Chaplain arrived. The unit secretary told Chaplain that patient was in dialysis and it would take four hour. Chaplain will pass the case to incoming Chaplain.   12/28/16 1900  Clinical Encounter Type  Visited With Health care provider  Visit Type Initial  Referral From Nurse  Consult/Referral To Chaplain  Spiritual Encounters  Spiritual Needs Literature;Brochure

## 2016-12-28 NOTE — Consult Note (Signed)
Neurosurgery-New Consultation Evaluation 12/28/2016 Clifford Benninger [id number]  Identifying Statement: Marcus [name] is a 51 y.o. male from [place] [address] with leg weakness  Physician Requesting Consultation: [facility] Emergency Dept  History of Present Illness: Mr. Goree is in the ED after experiencing onset of leg weakness this morning. Per him and family, he was walking fine yesterday but was unable to stand this morning. On arrival, his exam showed severe weakness in hip flexion and proximal strength and his reflexes were reduced. Given this, a lumbar spine MRI was obtained that showed mild chronic changes. The patient states he is feeling somewhat stronger when I arrive. He denies any numbness in his legs. He does not recall an episode of trauma or fall but states that he noticed some weakness after going to dialysis. He denies any radiating pain into his legs.   He has noted chronic constipation and reduced urine output per family. He reports episodes like this before.    Past Medical History:  Past Medical History:  Diagnosis Date  . BPH (benign prostatic hyperplasia)   . CAD (coronary artery disease) of artery bypass graft    a. s/p multipe PCI to LCx and OMI; Last cath in 07/2014 w/ POBA to 70% stenosis in LCx   . Chronic diastolic CHF (congestive heart failure) (HCC)    a. EF 55-60% by echo in 08/2015; Grade 2 DD noted.  . Depression   . Diabetes mellitus without complication (HCC)   . Dialysis patient [facility])    a. Tue/Th/Sat Schedule  . High cholesterol   . Hypertension   . Renal disorder   . Thyroid disease     Social History: Social History   Social History  . Marital status: Married    Spouse name: N/A  . Number of children: N/A  . Years of education: N/A   Occupational History  . Not on file.   Social History Main Topics  . Smoking status: Current Some Day Smoker  . Smokeless tobacco: Never Used  . Alcohol use No  . Drug use: Unknown  .  Sexual activity: Not on file   Other Topics Concern  . Not on file   Social History Narrative  . No narrative on file    Family History: Non contributory   Review of Systems:  Review of Systems - General ROS: Negative Psychological ROS: Negative Ophthalmic ROS: Negative ENT ROS: Negative Hematological and Lymphatic ROS: Negative  Endocrine ROS: Negative Respiratory ROS: Negative Cardiovascular ROS: Negative Gastrointestinal ROS: Negative Genitor-Urinary ROS: Negative Musculoskeletal ROS: Negative for back pain Neurological ROS: Negative for leg pain or numbness, positive for weakness Dermatological ROS: Negative  Physical Exam: BP (!) 175/71   Pulse (!) 59   Resp 17   Ht  (1.651 m)   Wt 74.4 kg (164 lb)   SpO2 94%   BMI 27.29 kg/m  Body mass index is 27.29 kg/m. Body surface area is 1.85 meters squared. General appearance: Alert, cooperative, in no acute distress Ext: No edema in LE bilaterally  Neurologic exam:  Mental status: alertness: alert,  affect: normal Speech: fluent and clear Motor: Strength I legs show symmetric 5/5 strength in knee extension/flexion and dorsiflexion/plantarflexion. He is 4+/5 in hip flexion Sensory: intact to light touch in lower extremities Reflexes: 1+ and symmetric bilaterally for patella Gait: not tested, lying in bed  Laboratory: Results for orders placed or performed during the hospital encounter of 12/28/16  CBC with Differential/Platelet  Result Value Ref Range  WBC 8.7 3.8 - 10.6 K/uL   RBC 2.83 (L) 4.40 - 5.90 MIL/uL   Hemoglobin 9.5 (L) 13.0 - 18.0 g/dL   HCT 27.2 (L) 53.6 - 64.4 %   MCV 96.9 80.0 - 100.0 fL   MCH 33.4 26.0 - 34.0 pg   MCHC 34.5 32.0 - 36.0 g/dL   RDW 03.4 (H) 74.2 - 59.5 %   Platelets 114 (L) 150 - 440 K/uL   Neutrophils Relative % 70 %   Neutro Abs 6.1 1.4 - 6.5 K/uL   Lymphocytes Relative 10 %   Lymphs Abs 0.8 (L) 1.0 - 3.6 K/uL   Monocytes Relative 6 %   Monocytes Absolute 0.5 0.2 -  1.0 K/uL   Eosinophils Relative 13 %   Eosinophils Absolute 1.2 (H) 0 - 0.7 K/uL   Basophils Relative 1 %   Basophils Absolute 0.1 0 - 0.1 K/uL  Basic metabolic panel  Result Value Ref Range   Sodium 132 (L) 135 - 145 mmol/L   Potassium 5.3 (H) 3.5 - 5.1 mmol/L   Chloride 94 (L) 101 - 111 mmol/L   CO2 26 22 - 32 mmol/L   Glucose, Bld 76 65 - 99 mg/dL   BUN 50 (H) 6 - 20 mg/dL   Creatinine, Ser 6.38 (H) 0.61 - 1.24 mg/dL   Calcium 9.2 8.9 - 75.6 mg/dL   GFR calc non Af Amer 8 (L) >60 mL/min   GFR calc Af Amer 9 (L) >60 mL/min   Anion gap 12 5 - 15  Troponin I  Result Value Ref Range   Troponin I <0.03 <0.03 ng/mL  Glucose, capillary  Result Value Ref Range   Glucose-Capillary 113 (H) 65 - 99 mg/dL     Imaging: MRI Lumbar Spine: 1. Moderate central and bilateral foraminal stenosis at L3-4 secondary to a broad-based disc protrusion and central annular tear in addition to bilateral moderate facet hypertrophy. 2. Mild left subarticular and foraminal narrowing at L4-5.  MRI Thoracic Spine: 1. Widespread abnormal signal within the substance of the thoracic spinal cord, appears to most affect the central gray matter, and is most apparent from T7 to the conus. No cord expansion, discrete cord tumor or spinal stenosis. Top differential considerations include spinal cord infarct and inflammatory myelitis. 2. No thoracic spinal stenosis or acute osseous abnormality   Impression/Plan:  Mr. Botts is here for evaluation of leg weakness that appears to be improving. It is not associated with numbness or pain and lumbar spine shows only chronic changes that would not explain these symptoms. His thoracic spine MRI shows concern for possible white matter changes or ischemia which could be explained by hypotension or decreased BP after dialysis. Neurology is involved and will be admitted for fluid/BP management. There is no surgical intervention indicated as no significant compression is  seen.   Plan - No indication for neurosurgical intervention at this time - Recommend continued neurology/medical evaluation for avoidance of hypotension given MRI findings

## 2016-12-28 NOTE — ED Notes (Signed)
Ambulated patient with 2 assist.  Gait unsteady and weak.  Patient returned to bed.

## 2016-12-28 NOTE — Progress Notes (Signed)
Post hd vs 

## 2016-12-28 NOTE — ED Notes (Signed)
Pt back from MRI 

## 2016-12-29 DIAGNOSIS — F4323 Adjustment disorder with mixed anxiety and depressed mood: Secondary | ICD-10-CM

## 2016-12-29 DIAGNOSIS — G373 Acute transverse myelitis in demyelinating disease of central nervous system: Principal | ICD-10-CM

## 2016-12-29 DIAGNOSIS — F341 Dysthymic disorder: Secondary | ICD-10-CM

## 2016-12-29 LAB — CBC
HCT: 28.9 % — ABNORMAL LOW (ref 40.0–52.0)
Hemoglobin: 9.9 g/dL — ABNORMAL LOW (ref 13.0–18.0)
MCH: 33 pg (ref 26.0–34.0)
MCHC: 34.4 g/dL (ref 32.0–36.0)
MCV: 96.1 fL (ref 80.0–100.0)
PLATELETS: 109 10*3/uL — AB (ref 150–440)
RBC: 3.01 MIL/uL — ABNORMAL LOW (ref 4.40–5.90)
RDW: 16.7 % — ABNORMAL HIGH (ref 11.5–14.5)
WBC: 4 10*3/uL (ref 3.8–10.6)

## 2016-12-29 LAB — BASIC METABOLIC PANEL
Anion gap: 14 (ref 5–15)
BUN: 33 mg/dL — ABNORMAL HIGH (ref 6–20)
CHLORIDE: 95 mmol/L — AB (ref 101–111)
CO2: 28 mmol/L (ref 22–32)
CREATININE: 4.85 mg/dL — AB (ref 0.61–1.24)
Calcium: 8.7 mg/dL — ABNORMAL LOW (ref 8.9–10.3)
GFR calc Af Amer: 15 mL/min — ABNORMAL LOW (ref >60)
GFR calc non Af Amer: 13 mL/min — ABNORMAL LOW (ref >60)
GLUCOSE: 292 mg/dL — AB (ref 65–99)
Potassium: 5 mmol/L (ref 3.5–5.1)
Sodium: 137 mmol/L (ref 135–145)

## 2016-12-29 LAB — HIV ANTIBODY (ROUTINE TESTING W REFLEX): HIV Screen 4th Generation wRfx: NONREACTIVE

## 2016-12-29 LAB — GLUCOSE, CAPILLARY
GLUCOSE-CAPILLARY: 300 mg/dL — AB (ref 65–99)
GLUCOSE-CAPILLARY: 243 mg/dL — AB (ref 65–99)
GLUCOSE-CAPILLARY: 189 mg/dL — AB (ref 65–99)
Glucose-Capillary: 302 mg/dL — ABNORMAL HIGH (ref 65–99)
Glucose-Capillary: 236 mg/dL — ABNORMAL HIGH (ref 65–99)

## 2016-12-29 LAB — PARATHYROID HORMONE, INTACT (NO CA): PTH: 86 pg/mL — AB (ref 15–65)

## 2016-12-29 LAB — HEPATITIS B SURFACE ANTIGEN: HEP B S AG: NEGATIVE

## 2016-12-29 LAB — VITAMIN B12: Vitamin B-12: 705 pg/mL (ref 180–914)

## 2016-12-29 MED ORDER — MORPHINE SULFATE (PF) 2 MG/ML IV SOLN
2.0000 mg | Freq: Once | INTRAVENOUS | Status: AC
  Administered 2016-12-29: 2 mg via INTRAVENOUS
  Filled 2016-12-29: qty 1

## 2016-12-29 MED ORDER — INSULIN ASPART 100 UNIT/ML ~~LOC~~ SOLN
7.0000 [IU] | Freq: Once | SUBCUTANEOUS | Status: AC
  Administered 2016-12-29: 7 [IU] via SUBCUTANEOUS
  Filled 2016-12-29: qty 7

## 2016-12-29 MED ORDER — EPOETIN ALFA 10000 UNIT/ML IJ SOLN
4000.0000 [IU] | INTRAMUSCULAR | Status: DC
  Administered 2016-12-30: 4000 [IU] via INTRAVENOUS

## 2016-12-29 NOTE — Progress Notes (Signed)
[facility] - [place] at [facility]   PATIENT NAME: Marcus [name]    MR#:  [id number]  DATE OF BIRTH:  [birth year]  SUBJECTIVE:  CHIEF COMPLAINT:   Chief Complaint  Patient presents with  . Extremity Weakness   He is sent from hemodialysis center yesterday with bilateral lower extremity weakness.   Started on IV steroids, feels slightly better today but still having complained of pain in both lower extremities.   MRI showed possibly a stroke in for a 6 spinal cord, received his scheduled hemodialysis here.  REVIEW OF SYSTEMS:  CONSTITUTIONAL: No fever, fatigue or weakness.  EYES: No blurred or double vision.  EARS, NOSE, AND THROAT: No tinnitus or ear pain.  RESPIRATORY: No cough, shortness of breath, wheezing or hemoptysis.  CARDIOVASCULAR: No chest pain, orthopnea, edema.  GASTROINTESTINAL: No nausea, vomiting, diarrhea or abdominal pain.  GENITOURINARY: No dysuria, hematuria.  ENDOCRINE: No polyuria, nocturia,  HEMATOLOGY: No anemia, easy bruising or bleeding SKIN: No rash or lesion. MUSCULOSKELETAL: Bilateral leg pain.   NEUROLOGIC: No tingling, numbness, weakness.  PSYCHIATRY: No anxiety or depression.   ROS  DRUG ALLERGIES:   Allergies  Allergen Reactions  . Eicosapentaenoic Acid (Epa) Rash  . Fish-Derived Products Rash    VITALS:  Blood pressure (!) 157/64, pulse 72, temperature 98.3 F (36.8 C), temperature source Oral, resp. rate 14, height  (1.651 m), weight 68.3 kg (150 lb 9.2 oz), SpO2 96 %.  PHYSICAL EXAMINATION:  GENERAL:  51 y.o.-year-old patient lying in the bed with no acute distress.  EYES: Pupils equal, round, reactive to light and accommodation. No scleral icterus. Extraocular muscles intact.  HEENT: Head atraumatic, normocephalic. Oropharynx and nasopharynx clear.  NECK:  Supple, no jugular venous distention. No thyroid enlargement, no tenderness.  LUNGS: Normal breath sounds bilaterally, no wheezing, rales,rhonchi or  crepitation. No use of accessory muscles of respiration.  CARDIOVASCULAR: S1, S2 normal. No murmurs, rubs, or gallops.  ABDOMEN: Soft, nontender, nondistended. Bowel sounds present. No organomegaly or mass.  EXTREMITIES: No pedal edema, cyanosis, or clubbing.  NEUROLOGIC: Cranial nerves II through XII are intact. Muscle strength 5/5 in Upper extremities, 4/5 in lower extremities. Sensation intact. Gait not checked.  PSYCHIATRIC: The patient is alert and oriented x 3.  SKIN: No obvious rash, lesion, or ulcer.   Physical Exam LABORATORY PANEL:   CBC  Recent Labs Lab 12/29/16 0504  WBC 4.0  HGB 9.9*  HCT 28.9*  PLT 109*   ------------------------------------------------------------------------------------------------------------------  Chemistries   Recent Labs Lab 12/29/16 0504  NA 137  K 5.0  CL 95*  CO2 28  GLUCOSE 292*  BUN 33*  CREATININE 4.85*  CALCIUM 8.7*   ------------------------------------------------------------------------------------------------------------------  Cardiac Enzymes  Recent Labs Lab 12/28/16 0627  TROPONINI <0.03   ------------------------------------------------------------------------------------------------------------------  RADIOLOGY:  Ct Head Wo Contrast  Result Date: 12/28/2016 CLINICAL DATA:  Syncope. Initial encounter. EXAM: CT HEAD WITHOUT CONTRAST TECHNIQUE: Contiguous axial images were obtained from the base of the skull through the vertex without intravenous contrast. COMPARISON:  04/12/2016 FINDINGS: Brain: No evidence of acute infarction, hemorrhage, hydrocephalus, extra-axial collection or mass lesion/mass effect. Mild cerebral volume loss, stable. Vascular: Atherosclerotic calcification.  No acute finding. Skull: Chronic scattered scalp thickening. No definitive contusion. Negative for skull fracture. Sinuses/Orbits: No evidence of injury. IMPRESSION: No acute finding or change from prior. Electronically Signed   By:  Marnee Spring M.D.   On: 12/28/2016 08:31   Mr Brain Wo Contrast  Result Date: 12/28/2016 CLINICAL DATA:  51 year old  male with sudden onset of bilateral leg weakness. The patient currently is able to stand, and can slightly lift his legs against gravity, but is too weak to walk independently. Symptoms upon waking today, reportedly was normal yesterday. Dialysis patient. EXAM: MRI HEAD WITHOUT CONTRAST TECHNIQUE: Multiplanar, multiecho pulse sequences of the brain and surrounding structures were obtained without intravenous contrast. COMPARISON:  Thoracic and lumbar spine MRI from today reported separately. Head CT without contrast 0809 hours today. FINDINGS: Brain: No restricted diffusion to suggest acute infarction. No midline shift, mass effect, evidence of mass lesion, ventriculomegaly, extra-axial collection or acute intracranial hemorrhage. Cervicomedullary junction and pituitary are within normal limits. Moderate size chronic lacunar infarct of the right paracentral pons. Lower brainstem and cervicomedullary junction appear within normal limits. Elsewhere gray and white matter signal are within normal limits. No chronic cerebral blood products. Vascular: Major intracranial vascular flow voids are preserved. Skull and upper cervical spine: Appear within normal limits. Normal bone marrow signal. Sinuses/Orbits: Postoperative changes to both globes. Trace paranasal sinus mucosal thickening. Other: Mastoids are clear.  Negative scalp soft tissues. IMPRESSION: 1.  No acute intracranial abnormality. 2. 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 3. See thoracic spine MRI today reported separately. Electronically Signed   By: Odessa Fleming M.D.   On: 12/28/2016 12:42   Mr Thoracic Spine Wo Contrast  Result Date: 12/28/2016 CLINICAL DATA:  51 year old male with sudden onset of bilateral leg weakness. The patient currently is able to stand, and can slightly lift his legs against gravity, but is too weak to  walk independently. Symptoms upon waking today, reportedly was normal yesterday. Dialysis patient. EXAM: MRI THORACIC SPINE WITHOUT CONTRAST TECHNIQUE: Multiplanar, multisequence MR imaging of the thoracic spine was performed. No intravenous contrast was administered. COMPARISON:  Lumbar MRI from today reported separately. FINDINGS: Limited sagittal imaging the cervical spine: Negative; suggestion of cervical spinal stenosis. Thoracic spine segmentation: Normal, and appears concordant with the lumbar study today reported separately. Alignment: Within normal limits, mild straightening of thoracic kyphosis. Vertebrae: No marrow edema or evidence of acute osseous abnormality. Visualized bone marrow signal is within normal limits. Cord: There is no thoracic spinal cord expansion, and is difficult to discern abnormal spinal cord signal on sagittal T1 or T2 weighted images, however, on axial T2 (series 8, image 28) and sagittal STIR imaging there is widespread abnormal T2 and STIR signal throughout the thoracic spinal cord. The signal appears primarily within the central gray matter, but does not resemble a syrinx. The signal abnormality is most pronounced below the T7 vertebral body, and appears nearly holo cord just above the conus which is at T12. There is no associated thoracic spinal canal stenosis or cord mass effect. No abnormal flow voids within the thoracic thecal sac. Paraspinal and other soft tissues: Small layering and loculated bilateral pleural effusions. Otherwise negative visualized thoracic viscera. Visible upper abdominal viscera remarkable for decreased T2 signal in both the liver and spleen such as due to hemosiderosis. Disc levels: Mild for age thoracic spine degeneration. Minimal T7-T8 disc bulging. No other significant thoracic disc disease. Intermittent thoracic facet hypertrophy, but no thoracic spinal stenosis. IMPRESSION: 1. Widespread abnormal signal within the substance of the thoracic spinal  cord, appears to most affect the central gray matter, and is most apparent from T7 to the conus. No cord expansion, discrete cord tumor or spinal stenosis. Top differential considerations include spinal cord infarct and inflammatory myelitis. 2. No thoracic spinal stenosis or acute osseous abnormality. 3. Small layering  and loculated bilateral pleural effusions. 4. 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 5. Study discussed by telephone with Dr. Nita Sickle on 12/28/2016 at 1235 hours. Electronically Signed   By: Odessa Fleming M.D.   On: 12/28/2016 12:40   Mr Lumbar Spine Wo Contrast  Result Date: 12/28/2016 CLINICAL DATA:  Sudden onset of bilateral lower extremity weakness. Legs gave out. EXAM: MRI LUMBAR SPINE WITHOUT CONTRAST TECHNIQUE: Multiplanar, multisequence MR imaging of the lumbar spine was performed. No intravenous contrast was administered. COMPARISON:  None. FINDINGS: Segmentation: 5 non rib-bearing lumbar type vertebral bodies are present. Alignment: AP alignment is anatomic. Leftward curvature is centered at L2-3. Vertebrae: Mild endplate marrow changes are noted anteriorly at L2 and L3. Vertebral body heights and marrow signal are otherwise normal. Conus medullaris: Extends to the L1 level and appears normal. Paraspinal and other soft tissues: Limited imaging of the abdomen demonstrates marked bilateral renal atrophy and multiple small cysts, compatible with chronic dialysis. No other discrete lesions are present. There is no significant adenopathy. Disc levels: L1-2:  Negative. L2-3:  Mild disc bulging is present. L3-4: A broad-based disc protrusion and central annular tear present. Moderate facet hypertrophy is noted bilaterally. This results in moderate central and bilateral foraminal stenosis. L4-5: A leftward disc bulge is present. Facet hypertrophy is worse left than right. Mild left subarticular and foraminal narrowing is present. L5-S1: Mild facet hypertrophy is worse on the  left. There is no significant stenosis. IMPRESSION: 1. Moderate central and bilateral foraminal stenosis at L3-4 secondary to a broad-based disc protrusion and central annular tear in addition to bilateral moderate facet hypertrophy. 2. Mild left subarticular and foraminal narrowing at L4-5. Electronically Signed   By: Marin Roberts M.D.   On: 12/28/2016 10:09    ASSESSMENT AND PLAN:   Principal Problem:   Adjustment disorder with mixed anxiety and depressed mood Active Problems:   Weakness   Dysthymia   1. Bilateral lower extremity weakness with differential including transverse myelitis versus spinal cord infarct from hypotension. Continue Solu-Medrol 500 mg IV 3 times a day. Neurology consultation. Neuro checks every 4 hours Monitor blood pressure Monitor signs and symptoms of respiratory compromise if this is transverse myelitis. Significant improvement with IV steroid for 1 day.  2. ESRD and hemodialysis: Consulted nephrology for dialysis  3. Hyperkalemia: Patient will need dialysis  4 Diabetes: Patient will need high-dose sliding scale insulin with diabetes coordinator consultation.  5. Essential hypertension: Continue labetalol, Imdur, Norvasc Avapro, hydralazine, clonidine, Cardura and Coreg  6. Hyperlipidemia: Continue statin  7. Depression: Continue Prozac   As there was some concern of suicidal tendency few days ago, psychiatric consult was called in.  8. Hypothyroid: Continue Synthroid  9. CAD: Continue aspirin, beta blocker, statin and Plavix   All the records are reviewed and case discussed with Care Management/Social Workerr. Management plans discussed with the patient, family and they are in agreement.  CODE STATUS: DNR  TOTAL TIME TAKING CARE OF THIS PATIENT: 35 minutes.     POSSIBLE D/C IN 1-2 DAYS, DEPENDING ON CLINICAL CONDITION.   Altamese Dilling M.D on 12/29/2016   Between 7am to 6pm - Pager - [phone]  After 6pm go  to [email or web address] - Social research officer, government  [facility]  Office  [phone]  CC: Primary care physician; [facility] [place]  Note: This dictation was prepared with Dragon dictation along with smaller phrase technology. Any transcriptional errors that result from this process are unintentional.

## 2016-12-29 NOTE — Progress Notes (Signed)
[facility]  ROUNDING NOTE   Subjective:  Patient well known to [place] from prior admissions. Patient went to outpatient hemodialysis yesterday when he reported bilateral lower extremity weakness. He was subsequently sent here for evaluation. At admission his potassium was 5.3. Potassium now down to 5.0. Interview conducted with the assistance of an interpreter today. Patient had some concerns regarding his outpatient dialysis and the amount of fluid removal. Patient did undergo dialysis while here yesterday and tolerated this quite well.   Objective:  Vital signs in last 24 hours:  Temp:  [97.5 F (36.4 C)-98.8 F (37.1 C)] 98.8 F (37.1 C) (04/25 0955) Pulse Rate:  [58-91] 78 (04/25 1053) Resp:  [9-23] 16 (04/25 0955) BP: (165-201)/(50-82) 171/53 (04/25 1053) SpO2:  [94 %-98 %] 96 % (04/25 0955) Weight:  [68.3 kg (150 lb 9.2 oz)-69 kg (152 lb 1.9 oz)] 68.3 kg (150 lb 9.2 oz) (04/24 1959)  Weight change: -5.39 kg (-11 lb 14.1 oz) Filed Weights   12/28/16 0622 12/28/16 1637 12/28/16 1959  Weight: 74.4 kg (164 lb) 69 kg (152 lb 1.9 oz) 68.3 kg (150 lb 9.2 oz)    Intake/Output: I/O last 3 completed shifts: In: 120 [P.O.:120] Out: 1506 [Urine:3; Other:1500; Stool:3]   Intake/Output this shift:  Total I/O In: 240 [P.O.:240] Out: 0   Physical Exam: General: No acute distress  Head: Normocephalic, atraumatic. Moist oral mucosal membranes  Eyes: Anicteric  Neck: Supple, trachea midline  Lungs:  Clear to auscultation, normal effort  Heart: S1S2 no rubs  Abdomen:  Soft, nontender, bowel sounds present  Extremities:  peripheral edema.  Neurologic: Nonfocal, moving all four extremities  Skin: No lesions  Access: LUE AVF    Basic Metabolic Panel:  Recent Labs Lab 12/28/16 0627 12/28/16 1652 12/29/16 0504  NA 132*  --  137  K 5.3*  --  5.0  CL 94*  --  95*  CO2 26  --  28  GLUCOSE 76  --  292*  BUN 50*  --  33*  CREATININE 7.07*  --  4.85*  CALCIUM  9.2  --  8.7*  PHOS  --  5.7*  --     Liver Function Tests: No results for input(s): AST, ALT, ALKPHOS, BILITOT, PROT, ALBUMIN in the last 168 hours. No results for input(s): LIPASE, AMYLASE in the last 168 hours. No results for input(s): AMMONIA in the last 168 hours.  CBC:  Recent Labs Lab 12/28/16 0627 12/29/16 0504  WBC 8.7 4.0  NEUTROABS 6.1  --   HGB 9.5* 9.9*  HCT 27.4* 28.9*  MCV 96.9 96.1  PLT 114* 109*    Cardiac Enzymes:  Recent Labs Lab 12/28/16 0627  TROPONINI <0.03    BNP: Invalid input(s): POCBNP  CBG:  Recent Labs Lab 12/28/16 2103 12/28/16 2342 12/29/16 0413 12/29/16 0759 12/29/16 1137  GLUCAP 102* 253* 300* 302* 243*    Microbiology: Results for orders placed or performed during the hospital encounter of 12/28/16  MRSA PCR Screening     Status: None   Collection Time: 12/28/16  4:12 PM  Result Value Ref Range Status   MRSA by PCR NEGATIVE NEGATIVE Final    Comment:        The GeneXpert MRSA Assay (FDA approved for NASAL specimens only), is one component of a comprehensive MRSA colonization surveillance program. It is not intended to diagnose MRSA infection nor to guide or monitor treatment for MRSA infections.     Coagulation Studies: No results for input(s):  LABPROT, INR in the last 72 hours.  Urinalysis: No results for input(s): COLORURINE, LABSPEC, PHURINE, GLUCOSEU, HGBUR, BILIRUBINUR, KETONESUR, PROTEINUR, UROBILINOGEN, NITRITE, LEUKOCYTESUR in the last 72 hours.  Invalid input(s): APPERANCEUR    Imaging: Ct Head Wo Contrast  Result Date: 12/28/2016 CLINICAL DATA:  Syncope. Initial encounter. EXAM: CT HEAD WITHOUT CONTRAST TECHNIQUE: Contiguous axial images were obtained from the base of the skull through the vertex without intravenous contrast. COMPARISON:  04/12/2016 FINDINGS: Brain: No evidence of acute infarction, hemorrhage, hydrocephalus, extra-axial collection or mass lesion/mass effect. Mild cerebral volume  loss, stable. Vascular: Atherosclerotic calcification.  No acute finding. Skull: Chronic scattered scalp thickening. No definitive contusion. Negative for skull fracture. Sinuses/Orbits: No evidence of injury. IMPRESSION: No acute finding or change from prior. Electronically Signed   By: Marnee Spring M.D.   On: 12/28/2016 08:31   Mr Brain Wo Contrast  Result Date: 12/28/2016 CLINICAL DATA:  51 year old male with sudden onset of bilateral leg weakness. The patient currently is able to stand, and can slightly lift his legs against gravity, but is too weak to walk independently. Symptoms upon waking today, reportedly was normal yesterday. Dialysis patient. EXAM: MRI HEAD WITHOUT CONTRAST TECHNIQUE: Multiplanar, multiecho pulse sequences of the brain and surrounding structures were obtained without intravenous contrast. COMPARISON:  Thoracic and lumbar spine MRI from today reported separately. Head CT without contrast 0809 hours today. FINDINGS: Brain: No restricted diffusion to suggest acute infarction. No midline shift, mass effect, evidence of mass lesion, ventriculomegaly, extra-axial collection or acute intracranial hemorrhage. Cervicomedullary junction and pituitary are within normal limits. Moderate size chronic lacunar infarct of the right paracentral pons. Lower brainstem and cervicomedullary junction appear within normal limits. Elsewhere gray and white matter signal are within normal limits. No chronic cerebral blood products. Vascular: Major intracranial vascular flow voids are preserved. Skull and upper cervical spine: Appear within normal limits. Normal bone marrow signal. Sinuses/Orbits: Postoperative changes to both globes. Trace paranasal sinus mucosal thickening. Other: Mastoids are clear.  Negative scalp soft tissues. IMPRESSION: 1.  No acute intracranial abnormality. 2. 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 3. See thoracic spine MRI today reported separately. Electronically Signed    By: Odessa Fleming M.D.   On: 12/28/2016 12:42   Mr Thoracic Spine Wo Contrast  Result Date: 12/28/2016 CLINICAL DATA:  51 year old male with sudden onset of bilateral leg weakness. The patient currently is able to stand, and can slightly lift his legs against gravity, but is too weak to walk independently. Symptoms upon waking today, reportedly was normal yesterday. Dialysis patient. EXAM: MRI THORACIC SPINE WITHOUT CONTRAST TECHNIQUE: Multiplanar, multisequence MR imaging of the thoracic spine was performed. No intravenous contrast was administered. COMPARISON:  Lumbar MRI from today reported separately. FINDINGS: Limited sagittal imaging the cervical spine: Negative; suggestion of cervical spinal stenosis. Thoracic spine segmentation: Normal, and appears concordant with the lumbar study today reported separately. Alignment: Within normal limits, mild straightening of thoracic kyphosis. Vertebrae: No marrow edema or evidence of acute osseous abnormality. Visualized bone marrow signal is within normal limits. Cord: There is no thoracic spinal cord expansion, and is difficult to discern abnormal spinal cord signal on sagittal T1 or T2 weighted images, however, on axial T2 (series 8, image 28) and sagittal STIR imaging there is widespread abnormal T2 and STIR signal throughout the thoracic spinal cord. The signal appears primarily within the central gray matter, but does not resemble a syrinx. The signal abnormality is most pronounced below the T7 vertebral body, and appears  nearly holo cord just above the conus which is at T12. There is no associated thoracic spinal canal stenosis or cord mass effect. No abnormal flow voids within the thoracic thecal sac. Paraspinal and other soft tissues: Small layering and loculated bilateral pleural effusions. Otherwise negative visualized thoracic viscera. Visible upper abdominal viscera remarkable for decreased T2 signal in both the liver and spleen such as due to hemosiderosis.  Disc levels: Mild for age thoracic spine degeneration. Minimal T7-T8 disc bulging. No other significant thoracic disc disease. Intermittent thoracic facet hypertrophy, but no thoracic spinal stenosis. IMPRESSION: 1. Widespread abnormal signal within the substance of the thoracic spinal cord, appears to most affect the central gray matter, and is most apparent from T7 to the conus. No cord expansion, discrete cord tumor or spinal stenosis. Top differential considerations include spinal cord infarct and inflammatory myelitis. 2. No thoracic spinal stenosis or acute osseous abnormality. 3. Small layering and loculated bilateral pleural effusions. 4. 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 5. Study discussed by telephone with Dr. Nita Sickle on 12/28/2016 at 1235 hours. Electronically Signed   By: Odessa Fleming M.D.   On: 12/28/2016 12:40   Mr Lumbar Spine Wo Contrast  Result Date: 12/28/2016 CLINICAL DATA:  Sudden onset of bilateral lower extremity weakness. Legs gave out. EXAM: MRI LUMBAR SPINE WITHOUT CONTRAST TECHNIQUE: Multiplanar, multisequence MR imaging of the lumbar spine was performed. No intravenous contrast was administered. COMPARISON:  None. FINDINGS: Segmentation: 5 non rib-bearing lumbar type vertebral bodies are present. Alignment: AP alignment is anatomic. Leftward curvature is centered at L2-3. Vertebrae: Mild endplate marrow changes are noted anteriorly at L2 and L3. Vertebral body heights and marrow signal are otherwise normal. Conus medullaris: Extends to the L1 level and appears normal. Paraspinal and other soft tissues: Limited imaging of the abdomen demonstrates marked bilateral renal atrophy and multiple small cysts, compatible with chronic dialysis. No other discrete lesions are present. There is no significant adenopathy. Disc levels: L1-2:  Negative. L2-3:  Mild disc bulging is present. L3-4: A broad-based disc protrusion and central annular tear present. Moderate facet  hypertrophy is noted bilaterally. This results in moderate central and bilateral foraminal stenosis. L4-5: A leftward disc bulge is present. Facet hypertrophy is worse left than right. Mild left subarticular and foraminal narrowing is present. L5-S1: Mild facet hypertrophy is worse on the left. There is no significant stenosis. IMPRESSION: 1. Moderate central and bilateral foraminal stenosis at L3-4 secondary to a broad-based disc protrusion and central annular tear in addition to bilateral moderate facet hypertrophy. 2. Mild left subarticular and foraminal narrowing at L4-5. Electronically Signed   By: Marin Roberts M.D.   On: 12/28/2016 10:09     Medications:   . sodium chloride    . sodium chloride    . methylPREDNISolone (SOLU-MEDROL) injection Stopped (12/29/16 1127)   . amLODipine  10 mg Oral Daily  . aspirin EC  81 mg Oral Daily  . atorvastatin  80 mg Oral QHS  . calcitRIOL  1 mcg Oral Once per day on Mon Wed Fri  . calcium acetate  667 mg Oral TID WC  . carbamazepine  200 mg Oral BID  . carvedilol  25 mg Oral BID  . cloNIDine  0.1 mg Oral BID  . clopidogrel  75 mg Oral QHS  . doxazosin  1 mg Oral QHS  . FLUoxetine  40 mg Oral Daily  . gabapentin  100 mg Oral TID  . heparin  5,000 Units Subcutaneous Q8H  .  hydrALAZINE  100 mg Oral TID  . insulin aspart  0-20 Units Subcutaneous TID WC  . insulin glargine  5 Units Subcutaneous Daily  . irbesartan  300 mg Oral QPC supper  . isosorbide mononitrate  90 mg Oral Daily  . labetalol  300 mg Oral BID  . levothyroxine  100 mcg Oral QAC breakfast  . pantoprazole  40 mg Oral Daily  . sodium chloride flush  3 mL Intravenous Q12H  . traZODone  25 mg Oral QHS   sodium chloride, sodium chloride, acetaminophen **OR** acetaminophen, alteplase, bisacodyl, heparin, hydrALAZINE, HYDROcodone-acetaminophen, lidocaine (PF), lidocaine-prilocaine, ondansetron **OR** ondansetron (ZOFRAN) IV, pentafluoroprop-tetrafluoroeth,  senna-docusate  Assessment/ Plan:  51 y.o. male  with uncontrolled diabetes mellitus type II, hypertension, hyperlipidemia, hypothyroidism, BPH, coronary artery disease status post stent, ESRD on HD, Anemia of CKD, SHPTH.   [facility] TTS  1. End stage renal disease TTHS: Patient completed hemodialysis yesterday. No acute indication for dialysis today. We will plan for dialysis again tomorrow.  2. Anemia of CKD:   hemoglobin currently 9.9. We will start the patient on Epogen 4000 units IV with dialysis.  3.  Secondary hyperparathyroidism: Phosphorus currently 5.7. Repeat phosphorus value tomorrow.  4. Hypertension: Continue amlodipine, carvedilol, clonidine, hydralazine, Imdur, and labetalol.  5. Lower extremity weakness. Changes noted on thoracic spine.  Recommend urology consultation.    LOS: 1 Kingsley Herandez 4/25/201812:03 PM

## 2016-12-29 NOTE — Evaluation (Signed)
Physical Therapy Evaluation Patient Details Name: Marcus [name] MRN: [id number] DOB: April 14, 1966 Today's Date: 12/29/2016   History of Present Illness  Pt is a spanish speaking 51 y.o. male admitted with B LE weakness, numbness, and tingling. MR brain imaging showed chronic lacunar infarct of R central pons and thoracic spine MR imaging showed widespread abnormal signal within substance of thoracic spinal cord affecting central gray matter from T7 to conus (suspected spinal infarct or inflammatory myelitis). Pt is presently being treated for ESRD on hemodialysis, and has PMH of CAD, DM, depression, HTN hypothyroidism, and s/p L heart catheterization.   Clinical Impression  Pt was resting in bed upon entry. Pt was very pleasant and agreeable to PT evaluation. Pt presents with L LE weakness and B ankle and foot tingling that is different from baseline. Pt was modified independent with bed mobility, CGA for transfers, min assist to CGA for ambulation. Pt will benefit from skilled PT to increase strength, improve balance, and improve overall functional mobility. Recommend HHPT and 24/7 assist at d/c to address deficits mentioned above and for safety at home.     Follow Up Recommendations Home health PT;Supervision/Assistance - 24 hour    Equipment Recommendations  Rolling walker with 5" wheels;Wheelchair (measurements PT) (Pt has 2WRW, [id number], and w/c at home (need to confirm he can have w/c returned from dialysis))    Recommendations for Other Services       Precautions / Restrictions Precautions Precautions: Fall Restrictions Weight Bearing Restrictions: No      Mobility  Bed Mobility Overal bed mobility: Modified Independent             General bed mobility comments: Pt utilized bed rail to sit EOB w/o assist  Transfers Overall transfer level: Modified independent Equipment used: Rolling walker (2 wheeled)             General transfer comment: Pt stood and pivoted to the  chair using RW while remaining steady on his feet w/o LOB  Ambulation/Gait Ambulation/Gait assistance: Min guard;Min assist Ambulation Distance (Feet): 140 Feet Assistive device: Rolling walker (2 wheeled)     Gait velocity interpretation: Below normal speed for age/gender General Gait Details: Pt ambulates with R LE externally rotated (pt reports this is baseline), with decreased stride length, and intermittently staggers to the L requiring min assist to maintain balance, otherwise pt requires CGA for safety   Stairs            Wheelchair Mobility    Modified Rankin (Stroke Patients Only)       Balance Overall balance assessment: Needs assistance Sitting-balance support: No upper extremity supported;Feet supported Sitting balance-Leahy Scale: Fair Sitting balance - Comments: Pt leaned posteriorly with LE strength assessment, v/c's provided for patient to limit trunk motion and pt able to correct posterior lean   Standing balance support: Bilateral upper extremity supported Standing balance-Leahy Scale: Fair                               Pertinent Vitals/Pain Pain Assessment: Faces Faces Pain Scale: Hurts a little bit Pain Location: B ankles and feet Pain Descriptors / Indicators: Tingling (pt described the sensation as pins and needles) Pain Intervention(s): Limited activity within patient's tolerance;Monitored during session    Home Living Family/patient expects to be discharged to:: Private residence Living Arrangements: Spouse/significant other Available Help at Discharge: Family Type of Home: House Home Access: Ramped entrance     Home Layout:  One level Home Equipment: Walker - 2 wheels;Walker - 4 wheels;Cane - single point;Shower seat;Grab bars - toilet Additional Comments: Pt reports that his wife recently became blind (~1week ago) and that he assists her at home.    Prior Function Level of Independence: Independent         Comments: Pt  reports that he used a rollator or w/c at baseline. He uses the rollator more, but when he is not feeling well he uses the w/c. Pt concerned b/c he left his w/c at dialysis d/t coming over via EMS to the hospital.     Hand Dominance        Extremity/Trunk Assessment   Upper Extremity Assessment Upper Extremity Assessment: Overall WFL for tasks assessed (B UE strength 5/5; good grip strength; sensation intact (not formally tested))    Lower Extremity Assessment Lower Extremity Assessment: RLE deficits/detail;LLE deficits/detail RLE Deficits / Details: Strength: 5/5 hip flex, knee flexion/extension, ankle DF/PF; decreased light touch in great toe (pt reports this is baseline); proprioception intact; coordination intact; appropriate amount of tone present LLE Deficits / Details: Strength: 4-/5 hip flex, knee flexion/extension, ankle DF/PF; decreased light touch in for L4, L5, and S1 dermatomes (pt reports this is baseline); proprioception difficult to assess d/t pt c/o pain/tingling in L ankle and foot; coordination intact; appropriate amount of tone present    Cervical / Trunk Assessment Cervical / Trunk Assessment: Normal  Communication   Communication: Prefers language other than [place];Interpreter utilized (Pt is spanish speaking; Spanish interpreter Marcus [name] present for entire PT session)  Cognition Arousal/Alertness: Awake/alert Behavior During Therapy: WFL for tasks assessed/performed Overall Cognitive Status: Within Functional Limits for tasks assessed                                        General Comments      Exercises     Assessment/Plan    PT Assessment Patient needs continued PT services  PT Problem List Decreased strength;Decreased mobility;Decreased balance       PT Treatment Interventions DME instruction;Therapeutic activities;Gait training;Therapeutic exercise;Patient/family education;Balance training;Functional mobility  training;Neuromuscular re-education;Wheelchair mobility training    PT Goals (Current goals can be found in the Care Plan section)  Acute Rehab PT Goals Patient Stated Goal: to go home and take care of his wife PT Goal Formulation: With patient Time For Goal Achievement: 12/29/16 Potential to Achieve Goals: Good    Frequency 7X/week   Barriers to discharge        Co-evaluation               End of Session Equipment Utilized During Treatment: Gait belt Activity Tolerance: Patient tolerated treatment well Patient left: in chair;with chair alarm set;with call bell/phone within reach Nurse Communication: Mobility status (pt requesting a bath and telephone to order dinner.) PT Visit Diagnosis: Unsteadiness on feet (R26.81);Other symptoms and signs involving the nervous system (R29.898);Muscle weakness (generalized) (M62.81)    Time: 1610-9604 PT Time Calculation (min) (ACUTE ONLY): 63 min   Charges:         PT G Codes:         Karlis Cregg, SPT 12/29/2016, 5:25 PM

## 2016-12-29 NOTE — Consult Note (Signed)
Reason for Consult: b/l leg weakness  Referring Physician: Dr. Madelon Lips  CC: b/l leg weakness   HPI: Marcus [name] is an 51 y.o. male  a known history of ESRD on hemodialysis, CAD status post stents and diabetes who presents with bilateral lower extremity weakness. Patient reports that he has been in his usual state of health. yesteray morning when he woke up both of his legs were weak and he was unable to walk. He went to dialysis and upon arrival to the dialysis center patient was evaluated rather dialysis nurse and due to his profound bilateral lower extremity weakness was sent to the emergency room for further evaluation. On MRI T spine pt has abnormal signal intensity from T7 to conus.     Past Medical History:  Diagnosis Date  . BPH (benign prostatic hyperplasia)   . CAD (coronary artery disease) of artery bypass graft    a. s/p multipe PCI to LCx and OMI; Last cath in 07/2014 w/ POBA to 70% stenosis in LCx   . Chronic diastolic CHF (congestive heart failure) (HCC)    a. EF 55-60% by echo in 08/2015; Grade 2 DD noted.  . Depression   . Diabetes mellitus without complication (HCC)   . Dialysis patient [facility])    a. Tue/Th/Sat Schedule  . High cholesterol   . Hypertension   . Renal disorder   . Thyroid disease     Past Surgical History:  Procedure Laterality Date  . CARDIAC CATHETERIZATION    . DIALYSIS FISTULA CREATION      History reviewed. No pertinent family history.  Social History:  reports that he has quit smoking. He has never used smokeless tobacco. He reports that he does not drink alcohol or use drugs.  Allergies  Allergen Reactions  . Eicosapentaenoic Acid (Epa) Rash  . Fish-Derived Products Rash    Medications: I have reviewed the patient's current medications.  ROS: History obtained from the patient  General ROS: negative for - chills, fatigue, fever, night sweats, weight gain or weight loss Psychological ROS: negative for - behavioral disorder,  hallucinations, memory difficulties, mood swings or suicidal ideation Ophthalmic ROS: negative for - blurry vision, double vision, eye pain or loss of vision ENT ROS: negative for - epistaxis, nasal discharge, oral lesions, sore throat, tinnitus or vertigo Allergy and Immunology ROS: negative for - hives or itchy/watery eyes Hematological and Lymphatic ROS: negative for - bleeding problems, bruising or swollen lymph nodes Endocrine ROS: negative for - galactorrhea, hair pattern changes, polydipsia/polyuria or temperature intolerance Respiratory ROS: negative for - cough, hemoptysis, shortness of breath or wheezing Cardiovascular ROS: negative for - chest pain, dyspnea on exertion, edema or irregular heartbeat Gastrointestinal ROS: negative for - abdominal pain, diarrhea, hematemesis, nausea/vomiting or stool incontinence Genito-Urinary ROS: ESRD on HD Musculoskeletal ROS: negative for - joint swelling or muscular weakness Neurological ROS: as noted in HPI Dermatological ROS: negative for rash and skin lesion changes  Physical Examination: Blood pressure (!) 157/64, pulse 72, temperature 98.3 F (36.8 C), temperature source Oral, resp. rate 14, height  (1.651 m), weight 68.3 kg (150 lb 9.2 oz), SpO2 96 %.   Neurological Examination   Mental Status: Alert, oriented, thought content appropriate.  Speech fluent without evidence of aphasia.  Able to follow 3 step commands without difficulty. Cranial Nerves: II: Discs flat bilaterally; Visual fields grossly normal, pupils equal, round, reactive to light and accommodation III,IV, VI: ptosis not present, extra-ocular motions intact bilaterally V,VII: smile symmetric, facial light touch sensation  normal bilaterally VIII: hearing normal bilaterally IX,X: gag reflex present XI: bilateral shoulder shrug XII: midline tongue extension Motor: Right : Upper extremity   5/5    Left:     Upper extremity   5/5  Lower extremity   4+/5     Lower  extremity   4+/5 Tone and bulk:normal tone throughout; no atrophy noted Sensory: Pinprick and light touch intact throughout, bilaterally Deep Tendon Reflexes: 1+ and symmetric throughout Plantars: Right: downgoing   Left: downgoing Cerebellar: normal finger-to-nose, normal rapid alternating movements and normal heel-to-shin test Gait: not testd      Laboratory Studies:   Basic Metabolic Panel:  Recent Labs Lab 12/28/16 0627 12/28/16 1652 12/29/16 0504  NA 132*  --  137  K 5.3*  --  5.0  CL 94*  --  95*  CO2 26  --  28  GLUCOSE 76  --  292*  BUN 50*  --  33*  CREATININE 7.07*  --  4.85*  CALCIUM 9.2  --  8.7*  PHOS  --  5.7*  --     Liver Function Tests: No results for input(s): AST, ALT, ALKPHOS, BILITOT, PROT, ALBUMIN in the last 168 hours. No results for input(s): LIPASE, AMYLASE in the last 168 hours. No results for input(s): AMMONIA in the last 168 hours.  CBC:  Recent Labs Lab 12/28/16 0627 12/29/16 0504  WBC 8.7 4.0  NEUTROABS 6.1  --   HGB 9.5* 9.9*  HCT 27.4* 28.9*  MCV 96.9 96.1  PLT 114* 109*    Cardiac Enzymes:  Recent Labs Lab 12/28/16 0627  TROPONINI <0.03    BNP: Invalid input(s): POCBNP  CBG:  Recent Labs Lab 12/28/16 2103 12/28/16 2342 12/29/16 0413 12/29/16 0759 12/29/16 1137  GLUCAP 102* 253* 300* 302* 243*    Microbiology: Results for orders placed or performed during the hospital encounter of 12/28/16  MRSA PCR Screening     Status: None   Collection Time: 12/28/16  4:12 PM  Result Value Ref Range Status   MRSA by PCR NEGATIVE NEGATIVE Final    Comment:        The GeneXpert MRSA Assay (FDA approved for NASAL specimens only), is one component of a comprehensive MRSA colonization surveillance program. It is not intended to diagnose MRSA infection nor to guide or monitor treatment for MRSA infections.     Coagulation Studies: No results for input(s): LABPROT, INR in the last 72 hours.  Urinalysis: No  results for input(s): COLORURINE, LABSPEC, PHURINE, GLUCOSEU, HGBUR, BILIRUBINUR, KETONESUR, PROTEINUR, UROBILINOGEN, NITRITE, LEUKOCYTESUR in the last 168 hours.  Invalid input(s): APPERANCEUR  Lipid Panel:  No results found for: CHOL, TRIG, HDL, CHOLHDL, VLDL, LDLCALC  HgbA1C:  Lab Results  Component Value Date   HGBA1C 9.1 (H) 04/14/2016    Urine Drug Screen:  No results found for: LABOPIA, COCAINSCRNUR, LABBENZ, AMPHETMU, THCU, LABBARB  Alcohol Level: No results for input(s): ETH in the last 168 hours.   Ct Head Wo Contrast  Result Date: 12/28/2016 CLINICAL DATA:  Syncope. Initial encounter. EXAM: CT HEAD WITHOUT CONTRAST TECHNIQUE: Contiguous axial images were obtained from the base of the skull through the vertex without intravenous contrast. COMPARISON:  04/12/2016 FINDINGS: Brain: No evidence of acute infarction, hemorrhage, hydrocephalus, extra-axial collection or mass lesion/mass effect. Mild cerebral volume loss, stable. Vascular: Atherosclerotic calcification.  No acute finding. Skull: Chronic scattered scalp thickening. No definitive contusion. Negative for skull fracture. Sinuses/Orbits: No evidence of injury. IMPRESSION: No acute finding or change from  prior. Electronically Signed   By: Marnee Spring M.D.   On: 12/28/2016 08:31   Mr Brain Wo Contrast  Result Date: 12/28/2016 CLINICAL DATA:  51 year old male with sudden onset of bilateral leg weakness. The patient currently is able to stand, and can slightly lift his legs against gravity, but is too weak to walk independently. Symptoms upon waking today, reportedly was normal yesterday. Dialysis patient. EXAM: MRI HEAD WITHOUT CONTRAST TECHNIQUE: Multiplanar, multiecho pulse sequences of the brain and surrounding structures were obtained without intravenous contrast. COMPARISON:  Thoracic and lumbar spine MRI from today reported separately. Head CT without contrast 0809 hours today. FINDINGS: Brain: No restricted diffusion to  suggest acute infarction. No midline shift, mass effect, evidence of mass lesion, ventriculomegaly, extra-axial collection or acute intracranial hemorrhage. Cervicomedullary junction and pituitary are within normal limits. Moderate size chronic lacunar infarct of the right paracentral pons. Lower brainstem and cervicomedullary junction appear within normal limits. Elsewhere gray and white matter signal are within normal limits. No chronic cerebral blood products. Vascular: Major intracranial vascular flow voids are preserved. Skull and upper cervical spine: Appear within normal limits. Normal bone marrow signal. Sinuses/Orbits: Postoperative changes to both globes. Trace paranasal sinus mucosal thickening. Other: Mastoids are clear.  Negative scalp soft tissues. IMPRESSION: 1.  No acute intracranial abnormality. 2. 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 3. See thoracic spine MRI today reported separately. Electronically Signed   By: Odessa Fleming M.D.   On: 12/28/2016 12:42   Mr Thoracic Spine Wo Contrast  Result Date: 12/28/2016 CLINICAL DATA:  51 year old male with sudden onset of bilateral leg weakness. The patient currently is able to stand, and can slightly lift his legs against gravity, but is too weak to walk independently. Symptoms upon waking today, reportedly was normal yesterday. Dialysis patient. EXAM: MRI THORACIC SPINE WITHOUT CONTRAST TECHNIQUE: Multiplanar, multisequence MR imaging of the thoracic spine was performed. No intravenous contrast was administered. COMPARISON:  Lumbar MRI from today reported separately. FINDINGS: Limited sagittal imaging the cervical spine: Negative; suggestion of cervical spinal stenosis. Thoracic spine segmentation: Normal, and appears concordant with the lumbar study today reported separately. Alignment: Within normal limits, mild straightening of thoracic kyphosis. Vertebrae: No marrow edema or evidence of acute osseous abnormality. Visualized bone marrow  signal is within normal limits. Cord: There is no thoracic spinal cord expansion, and is difficult to discern abnormal spinal cord signal on sagittal T1 or T2 weighted images, however, on axial T2 (series 8, image 28) and sagittal STIR imaging there is widespread abnormal T2 and STIR signal throughout the thoracic spinal cord. The signal appears primarily within the central gray matter, but does not resemble a syrinx. The signal abnormality is most pronounced below the T7 vertebral body, and appears nearly holo cord just above the conus which is at T12. There is no associated thoracic spinal canal stenosis or cord mass effect. No abnormal flow voids within the thoracic thecal sac. Paraspinal and other soft tissues: Small layering and loculated bilateral pleural effusions. Otherwise negative visualized thoracic viscera. Visible upper abdominal viscera remarkable for decreased T2 signal in both the liver and spleen such as due to hemosiderosis. Disc levels: Mild for age thoracic spine degeneration. Minimal T7-T8 disc bulging. No other significant thoracic disc disease. Intermittent thoracic facet hypertrophy, but no thoracic spinal stenosis. IMPRESSION: 1. Widespread abnormal signal within the substance of the thoracic spinal cord, appears to most affect the central gray matter, and is most apparent from T7 to the conus. No cord  expansion, discrete cord tumor or spinal stenosis. Top differential considerations include spinal cord infarct and inflammatory myelitis. 2. No thoracic spinal stenosis or acute osseous abnormality. 3. Small layering and loculated bilateral pleural effusions. 4. 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 5. Study discussed by telephone with Dr. Nita Sickle on 12/28/2016 at 1235 hours. Electronically Signed   By: Odessa Fleming M.D.   On: 12/28/2016 12:40   Mr Lumbar Spine Wo Contrast  Result Date: 12/28/2016 CLINICAL DATA:  Sudden onset of bilateral lower extremity weakness. Legs  gave out. EXAM: MRI LUMBAR SPINE WITHOUT CONTRAST TECHNIQUE: Multiplanar, multisequence MR imaging of the lumbar spine was performed. No intravenous contrast was administered. COMPARISON:  None. FINDINGS: Segmentation: 5 non rib-bearing lumbar type vertebral bodies are present. Alignment: AP alignment is anatomic. Leftward curvature is centered at L2-3. Vertebrae: Mild endplate marrow changes are noted anteriorly at L2 and L3. Vertebral body heights and marrow signal are otherwise normal. Conus medullaris: Extends to the L1 level and appears normal. Paraspinal and other soft tissues: Limited imaging of the abdomen demonstrates marked bilateral renal atrophy and multiple small cysts, compatible with chronic dialysis. No other discrete lesions are present. There is no significant adenopathy. Disc levels: L1-2:  Negative. L2-3:  Mild disc bulging is present. L3-4: A broad-based disc protrusion and central annular tear present. Moderate facet hypertrophy is noted bilaterally. This results in moderate central and bilateral foraminal stenosis. L4-5: A leftward disc bulge is present. Facet hypertrophy is worse left than right. Mild left subarticular and foraminal narrowing is present. L5-S1: Mild facet hypertrophy is worse on the left. There is no significant stenosis. IMPRESSION: 1. Moderate central and bilateral foraminal stenosis at L3-4 secondary to a broad-based disc protrusion and central annular tear in addition to bilateral moderate facet hypertrophy. 2. Mild left subarticular and foraminal narrowing at L4-5. Electronically Signed   By: Marin Roberts M.D.   On: 12/28/2016 10:09     Assessment/Plan:  51 y.o. male  a known history of ESRD on hemodialysis, CAD status post stents and diabetes who presents with bilateral lower extremity weakness. Patient reports that he has been in his usual state of health. yesteray morning when he woke up both of his legs were weak and he was unable to walk. He went to  dialysis and upon arrival to the dialysis center patient was evaluated rather dialysis nurse and due to his profound bilateral lower extremity weakness was sent to the emergency room for further evaluation. On MRI T spine pt has abnormal signal intensity from T7 to conus.   Deferential includes vascular infarct vs transverse myelitis This AM pt is much improved and was able to ambulate with limited assistance B12 to be sent I don't think this is infarct even though that territory is watershed area as significant improvement Pt started on solumedrol 500 q12 for 3 days Finish steroids Pt/ot Pt does not have sensory level and bowel/bladder intact.  D/c planning  12/29/2016, 1:30 PM

## 2016-12-29 NOTE — Progress Notes (Addendum)
Inpatient Diabetes Program Recommendations  AACE/ADA: New Consensus Statement on Inpatient Glycemic Control (2015)  Target Ranges:  Prepandial:   less than 140 mg/dL      Peak postprandial:   less than 180 mg/dL (1-2 hours)      Critically ill patients:  140 - 180 mg/dL   Results for Marcus [name], Marcus [name] (MRN [id number]) as of 12/29/2016 07:28  Ref. Range 12/28/2016 07:58 12/28/2016 15:23 12/28/2016 21:03 12/28/2016 23:42 12/29/2016 04:13  Glucose-Capillary Latest Ref Range: 65 - 99 mg/dL 657 (H) 88 846 (H) 962 (H) 300 (H)   Review of Glycemic Control  Diabetes history: DM Outpatient Diabetes medications: NPH 10 units QAM, NPH 10 units QHS Current orders for Inpatient glycemic control: Lantus 5 units daily, Novolog 0-20 units TID with meals  Inpatient Diabetes Program Recommendations: Insulin-Basal: Please consider increasing Lantus to 5 units BID. Correction (SSI): Please consider decreasing Novolog correction scale to Moderate scale (Novolog 0-15 units) and adding Novolog 0-5 units QHS for bedtime correction. Insulin - Meal Coverage: If steroids are continued, please consider ordering Novolog 4 units TID with meals for meal coverage. A1C: Per Care Everywhere, last A1C was 7.2% on 12/14/16 which is reflective of patient's reported glucose averaging around 150-160 mg/dl.  NOTE: Noted consult for Diabetes Coordinator. Chart reviewed.   Addendum 12/29/16@14 :00- Spoke with patient (using Jacqui, Orthoptist) about diabetes and home regimen for diabetes control. Patient reports that he is followed by doctor at [facility] for diabetes management and currently he takes NPH 10 units BID (in the morning and at bedtime) as an outpatient for diabetes control.   Patient states that he checks his glucose 2 times per day and that it is usually in the 150-160 mg/dl range. Patient reports lowest glucose 156 mg/dl and highest glucose 952 mg/dl over the past 1 week. Patient reports that he is able to get all of  his testing supplies and insulin from [facility].  Inquired about prior A1C and patient reports that he does not know what an A1C is. Explained what an A1C is and discussed glucose and A1C goals. Patient states that he tries to take good care of himself. Praised patient for keeping DM fairly well controlled and encouraged him to continue to do so.  Patient verbalized understanding of information discussed and he states that he has no further questions at this time related to diabetes.  Thanks, Orlando Penner, RN, MSN, CDE Diabetes Coordinator Inpatient Diabetes Program [phone] (Team Pager from 8am to 5pm)

## 2016-12-29 NOTE — Consult Note (Signed)
[facility] Face-to-Face Psychiatry Consult   Reason for Consult:  Consult for 51 year old man who is in the hospital for concerned about acute weakness in his legs. Concern was raised about suicidal ideation. Referring Physician:  Buck Mam Patient Identification: Donovyn Guidice MRN:  [id number] Principal Diagnosis: Adjustment disorder with mixed anxiety and depressed mood Diagnosis:   Patient Active Problem List   Diagnosis Date Noted  . Adjustment disorder with mixed anxiety and depressed mood [F43.23] 12/29/2016  . Dysthymia [F34.1] 12/29/2016  . Weakness [R53.1] 12/28/2016  . Hyperkalemia [E87.5] 04/02/2016  . CAD in native artery [I25.10] 10/22/2015  . Chronic diastolic heart failure ([place]) [I50.32] 10/22/2015  . Syncope [R55] 10/22/2015  . ESRD (end stage renal disease) on dialysis ([place]) [N18.6, Z99.2] 10/22/2015  . CAP (community acquired pneumonia) [J18.9] 10/22/2015  . Aspiration pneumonia of right lung ([place]) [J69.0]   . Hyperglycemia [R73.9]   . Syncope and collapse [R55]   . Hypoxia [R09.02] 10/21/2015    Total Time spent with patient: 1 hour  Subjective:   Nescatunga Shellhammer is a 51 y.o. male patient admitted with "I don't know why I'm here".  HPI:  Patient interviewed. Chart reviewed. I interviewed the patient with the assistance of a hospital provided Spanish language interpreter. 51 year old man was getting dialysis when he reported that he had bilateral weakness of his legs. 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 On screening by nursing he answered positives him to some questions about suicidality which triggered suicide precautions and psychiatric consult. Patient tells me that his mood feels bad all of the time but he blames it all on his financial situation. He says he has chronic severe financial problems because he is not able to work. He says he feels down all the time.  Feels hopeless about his situation. On the other hand he denies having any thoughts of doing anything to kill himself. He claims to me that the last time he ever seriously thought about it was 2 years ago when he went and stood on some railroad tracks. He had told the nurses allegedly that it had been just a couple weeks ago but when I confronted him with that he denied it. Patient says that he more or less sleeps okay. Appetite is okay. Denies psychotic symptoms. He says that he is compliant with all his prescribed medicine which includes medicine for depression.  Social history: Patient lives with his wife. Wife also reportedly is disabled. Apparently they are able to still live independently but patient says her financial problems are quite severe.  Medical history: Patient is on chronic dialysis. Has multiple medical issues. He complained of acute leg weakness on admission and there was concern about some kind of spinal cord lesion which evidently is still being worked up.  Substance abuse history: Patient denies any current use of alcohol or drugs. Says he hasn't used alcohol in years.  Past Psychiatric History: Patient has been treated chronically with Prozac. I saw this patient for a similar consult about 3 years ago. At that time he was moderately depressed and a little histrionic about it but was at that time denying suicidality as well. No evidence that he has past psychiatric admissions or actually tried to kill himself.  Risk to Self: Is patient at risk for suicide?: No Risk to Others:   Prior Inpatient Therapy:   Prior Outpatient Therapy:  Past Medical History:  Past Medical History:  Diagnosis Date  . BPH (benign prostatic hyperplasia)   . CAD (coronary artery disease) of artery bypass graft    a. s/p multipe PCI to LCx and OMI; Last cath in 07/2014 w/ POBA to 70% stenosis in LCx   . Chronic diastolic CHF (congestive heart failure) (HCC)    a. EF 55-60% by echo in 08/2015; Grade  2 DD noted.  . Depression   . Diabetes mellitus without complication ([place])   . Dialysis patient [facility])    a. Tue/Th/Sat Schedule  . High cholesterol   . Hypertension   . Renal disorder   . Thyroid disease     Past Surgical History:  Procedure Laterality Date  . CARDIAC CATHETERIZATION    . DIALYSIS FISTULA CREATION     Family History: History reviewed. No pertinent family history. Family Psychiatric  History: Does not know of any Social History:  History  Alcohol Use No     History  Drug Use No    Social History   Social History  . Marital status: Married    Spouse name: N/A  . Number of children: N/A  . Years of education: N/A   Social History Main Topics  . Smoking status: Former Research scientist (life sciences)  . Smokeless tobacco: Never Used  . Alcohol use No  . Drug use: No  . Sexual activity: Not Asked   Other Topics Concern  . None   Social History Narrative  . None   Additional Social History:    Allergies:   Allergies  Allergen Reactions  . Eicosapentaenoic Acid (Epa) Rash  . Fish-Derived Products Rash    Labs:  Results for orders placed or performed during the hospital encounter of 12/28/16 (from the past 48 hour(s))  CBC with Differential/Platelet     Status: Abnormal   Collection Time: 12/28/16  6:27 AM  Result Value Ref Range   WBC 8.7 3.8 - 10.6 K/uL   RBC 2.83 (L) 4.40 - 5.90 MIL/uL   Hemoglobin 9.5 (L) 13.0 - 18.0 g/dL   HCT 27.4 (L) 40.0 - 52.0 %   MCV 96.9 80.0 - 100.0 fL   MCH 33.4 26.0 - 34.0 pg   MCHC 34.5 32.0 - 36.0 g/dL   RDW 17.0 (H) 11.5 - 14.5 %   Platelets 114 (L) 150 - 440 K/uL   Neutrophils Relative % 70 %   Neutro Abs 6.1 1.4 - 6.5 K/uL   Lymphocytes Relative 10 %   Lymphs Abs 0.8 (L) 1.0 - 3.6 K/uL   Monocytes Relative 6 %   Monocytes Absolute 0.5 0.2 - 1.0 K/uL   Eosinophils Relative 13 %   Eosinophils Absolute 1.2 (H) 0 - 0.7 K/uL   Basophils Relative 1 %   Basophils Absolute 0.1 0 - 0.1 K/uL  Basic metabolic panel     Status:  Abnormal   Collection Time: 12/28/16  6:27 AM  Result Value Ref Range   Sodium 132 (L) 135 - 145 mmol/L   Potassium 5.3 (H) 3.5 - 5.1 mmol/L   Chloride 94 (L) 101 - 111 mmol/L   CO2 26 22 - 32 mmol/L   Glucose, Bld 76 65 - 99 mg/dL   BUN 50 (H) 6 - 20 mg/dL   Creatinine, Ser 7.07 (H) 0.61 - 1.24 mg/dL   Calcium 9.2 8.9 - 10.3 mg/dL   GFR calc non Af Amer 8 (L) >60 mL/min   GFR calc Af Amer 9 (L) >60 mL/min  Comment: (NOTE) The eGFR has been calculated using the CKD EPI equation. This calculation has not been validated in all clinical situations. eGFR's persistently <60 mL/min signify possible Chronic Kidney Disease.    Anion gap 12 5 - 15  Troponin I     Status: None   Collection Time: 12/28/16  6:27 AM  Result Value Ref Range   Troponin I <0.03 <0.03 ng/mL  Glucose, capillary     Status: Abnormal   Collection Time: 12/28/16  7:58 AM  Result Value Ref Range   Glucose-Capillary 113 (H) 65 - 99 mg/dL  Glucose, capillary     Status: None   Collection Time: 12/28/16  3:23 PM  Result Value Ref Range   Glucose-Capillary 88 65 - 99 mg/dL  MRSA PCR Screening     Status: None   Collection Time: 12/28/16  4:12 PM  Result Value Ref Range   MRSA by PCR NEGATIVE NEGATIVE    Comment:        The GeneXpert MRSA Assay (FDA approved for NASAL specimens only), is one component of a comprehensive MRSA colonization surveillance program. It is not intended to diagnose MRSA infection nor to guide or monitor treatment for MRSA infections.   Phosphorus     Status: Abnormal   Collection Time: 12/28/16  4:52 PM  Result Value Ref Range   Phosphorus 5.7 (H) 2.5 - 4.6 mg/dL  Parathyroid hormone, intact (no Ca)     Status: Abnormal   Collection Time: 12/28/16  4:52 PM  Result Value Ref Range   PTH 86 (H) 15 - 65 pg/mL    Comment: (NOTE) Performed At: [facility] [address] [place], [place] [id number] Lindon Romp MD [id number]   HIV antibody (Routine Testing)      Status: None   Collection Time: 12/28/16  4:52 PM  Result Value Ref Range   HIV Screen 4th Generation wRfx Non Reactive Non Reactive    Comment: (NOTE) Performed At: [facility] [address] [place], [place] [id number] Lindon Romp MD [id number]   TSH     Status: None   Collection Time: 12/28/16  4:52 PM  Result Value Ref Range   TSH 2.766 0.350 - 4.500 uIU/mL    Comment: Performed by a 3rd Generation assay with a functional sensitivity of <=0.01 uIU/mL.  Hepatitis B surface antigen     Status: None   Collection Time: 12/28/16  4:52 PM  Result Value Ref Range   Hepatitis B Surface Ag Negative Negative    Comment: (NOTE) Performed At: [facility] [address] [place], [place] [id number] Lindon Romp MD [id number]   Glucose, capillary     Status: Abnormal   Collection Time: 12/28/16  9:03 PM  Result Value Ref Range   Glucose-Capillary 102 (H) 65 - 99 mg/dL  Glucose, capillary     Status: Abnormal   Collection Time: 12/28/16 11:42 PM  Result Value Ref Range   Glucose-Capillary 253 (H) 65 - 99 mg/dL  Glucose, capillary     Status: Abnormal   Collection Time: 12/29/16  4:13 AM  Result Value Ref Range   Glucose-Capillary 300 (H) 65 - 99 mg/dL  Basic metabolic panel     Status: Abnormal   Collection Time: 12/29/16  5:04 AM  Result Value Ref Range   Sodium 137 135 - 145 mmol/L   Potassium 5.0 3.5 - 5.1 mmol/L   Chloride 95 (L) 101 - 111 mmol/L   CO2 28 22 - 32  mmol/L   Glucose, Bld 292 (H) 65 - 99 mg/dL   BUN 33 (H) 6 - 20 mg/dL   Creatinine, Ser 4.85 (H) 0.61 - 1.24 mg/dL   Calcium 8.7 (L) 8.9 - 10.3 mg/dL   GFR calc non Af Amer 13 (L) >60 mL/min   GFR calc Af Amer 15 (L) >60 mL/min    Comment: (NOTE) The eGFR has been calculated using the CKD EPI equation. This calculation has not been validated in all clinical situations. eGFR's persistently <60 mL/min signify possible Chronic Kidney Disease.    Anion gap 14 5 - 15  CBC     Status:  Abnormal   Collection Time: 12/29/16  5:04 AM  Result Value Ref Range   WBC 4.0 3.8 - 10.6 K/uL   RBC 3.01 (L) 4.40 - 5.90 MIL/uL   Hemoglobin 9.9 (L) 13.0 - 18.0 g/dL   HCT 28.9 (L) 40.0 - 52.0 %   MCV 96.1 80.0 - 100.0 fL   MCH 33.0 26.0 - 34.0 pg   MCHC 34.4 32.0 - 36.0 g/dL   RDW 16.7 (H) 11.5 - 14.5 %   Platelets 109 (L) 150 - 440 K/uL  Glucose, capillary     Status: Abnormal   Collection Time: 12/29/16  7:59 AM  Result Value Ref Range   Glucose-Capillary 302 (H) 65 - 99 mg/dL  Glucose, capillary     Status: Abnormal   Collection Time: 12/29/16 11:37 AM  Result Value Ref Range   Glucose-Capillary 243 (H) 65 - 99 mg/dL    Current Facility-Administered Medications  Medication Dose Route Frequency Provider Last Rate Last Dose  . 0.9 %  sodium chloride infusion  100 mL Intravenous PRN Munsoor Lateef, MD      . 0.9 %  sodium chloride infusion  100 mL Intravenous PRN Munsoor Lateef, MD      . acetaminophen (TYLENOL) tablet 650 mg  650 mg Oral Q6H PRN Bettey Costa, MD   650 mg at 12/29/16 0526   Or  . acetaminophen (TYLENOL) suppository 650 mg  650 mg Rectal Q6H PRN Sital Mody, MD      . alteplase (CATHFLO ACTIVASE) injection 2 mg  2 mg Intracatheter Once PRN Munsoor Lateef, MD      . amLODipine (NORVASC) tablet 10 mg  10 mg Oral Daily Sital Mody, MD   10 mg at 12/29/16 1057  . aspirin EC tablet 81 mg  81 mg Oral Daily Sital Mody, MD   81 mg at 12/29/16 1057  . atorvastatin (LIPITOR) tablet 80 mg  80 mg Oral QHS Bettey Costa, MD   80 mg at 12/28/16 2231  . bisacodyl (DULCOLAX) EC tablet 5 mg  5 mg Oral Daily PRN Bettey Costa, MD      . calcitRIOL (ROCALTROL) capsule 1 mcg  1 mcg Oral Once per day on Mon Wed Fri Bettey Costa, MD   1 mcg at 12/29/16 1056  . calcium acetate (PHOSLO) capsule 667 mg  667 mg Oral TID WC Bettey Costa, MD   667 mg at 12/29/16 1237  . carbamazepine (TEGRETOL XR) 12 hr tablet 200 mg  200 mg Oral BID Bettey Costa, MD   200 mg at 12/29/16 1057  . carvedilol (COREG) tablet  25 mg  25 mg Oral BID Bettey Costa, MD   25 mg at 12/29/16 1057  . cloNIDine (CATAPRES) tablet 0.1 mg  0.1 mg Oral BID Bettey Costa, MD   0.1 mg at 12/29/16 1105  . clopidogrel (PLAVIX) tablet  75 mg  75 mg Oral QHS Bettey Costa, MD   75 mg at 12/28/16 2231  . doxazosin (CARDURA) tablet 1 mg  1 mg Oral QHS Bettey Costa, MD   1 mg at 12/28/16 2231  . [START ON 12/30/2016] epoetin alfa (EPOGEN,PROCRIT) injection 4,000 Units  4,000 Units Intravenous Q T,Th,Sa-HD Munsoor Lateef, MD      . FLUoxetine (PROZAC) capsule 40 mg  40 mg Oral Daily Sital Mody, MD   40 mg at 12/29/16 1056  . gabapentin (NEURONTIN) capsule 100 mg  100 mg Oral TID Bettey Costa, MD   100 mg at 12/29/16 1056  . heparin injection 1,000 Units  1,000 Units Dialysis PRN Munsoor Lateef, MD      . heparin injection 5,000 Units  5,000 Units Subcutaneous Q8H Bettey Costa, MD   5,000 Units at 12/29/16 0527  . hydrALAZINE (APRESOLINE) injection 10 mg  10 mg Intravenous Q6H PRN Bettey Costa, MD   10 mg at 12/28/16 1825  . hydrALAZINE (APRESOLINE) tablet 100 mg  100 mg Oral TID Bettey Costa, MD   100 mg at 12/29/16 1056  . HYDROcodone-acetaminophen (NORCO/VICODIN) 5-325 MG per tablet 1-2 tablet  1-2 tablet Oral Q4H PRN Bettey Costa, MD   2 tablet at 12/29/16 0526  . insulin aspart (novoLOG) injection 0-20 Units  0-20 Units Subcutaneous TID WC Bettey Costa, MD   7 Units at 12/29/16 1236  . insulin glargine (LANTUS) injection 5 Units  5 Units Subcutaneous Daily Bettey Costa, MD   5 Units at 12/29/16 1057  . irbesartan (AVAPRO) tablet 300 mg  300 mg Oral QPC supper Bettey Costa, MD      . isosorbide mononitrate (IMDUR) 24 hr tablet 90 mg  90 mg Oral Daily Sital Mody, MD   90 mg at 12/29/16 1056  . labetalol (NORMODYNE) tablet 300 mg  300 mg Oral BID Bettey Costa, MD   300 mg at 12/29/16 1055  . levothyroxine (SYNTHROID, LEVOTHROID) tablet 100 mcg  100 mcg Oral QAC breakfast Bettey Costa, MD   100 mcg at 12/29/16 0828  . lidocaine (PF) (XYLOCAINE) 1 % injection 5 mL  5 mL  Intradermal PRN Munsoor Lateef, MD      . lidocaine-prilocaine (EMLA) cream 1 application  1 application Topical PRN Munsoor Lateef, MD      . methylPREDNISolone sodium succinate (SOLU-MEDROL) 500 mg in sodium chloride 0.9 % 50 mL IVPB  500 mg Intravenous TID Rudene Re, MD   Stopped at 12/29/16 1127  . ondansetron (ZOFRAN) tablet 4 mg  4 mg Oral Q6H PRN Bettey Costa, MD       Or  . ondansetron (ZOFRAN) injection 4 mg  4 mg Intravenous Q6H PRN Bettey Costa, MD   4 mg at 12/29/16 0053  . pantoprazole (PROTONIX) EC tablet 40 mg  40 mg Oral Daily Bettey Costa, MD   40 mg at 12/29/16 1057  . pentafluoroprop-tetrafluoroeth (GEBAUERS) aerosol 1 application  1 application Topical PRN Munsoor Lateef, MD      . senna-docusate (Senokot-S) tablet 1 tablet  1 tablet Oral QHS PRN Bettey Costa, MD      . sodium chloride flush (NS) 0.9 % injection 3 mL  3 mL Intravenous Q12H Sital Mody, MD   3 mL at 12/29/16 1058  . traZODone (DESYREL) tablet 25 mg  25 mg Oral QHS Bettey Costa, MD   25 mg at 12/28/16 2231    Musculoskeletal: Strength & Muscle Tone: decreased Gait & Station: unable to stand Patient leans:  N/A  Psychiatric Specialty Exam: Physical Exam  Nursing note and vitals reviewed. Constitutional: He appears well-developed and well-nourished.  HENT:  Head: Normocephalic and atraumatic.  Eyes: Conjunctivae are normal. Pupils are equal, round, and reactive to light.  Neck: Normal range of motion.  Cardiovascular: Regular rhythm and normal heart sounds.   Respiratory: Effort normal. No respiratory distress.  GI: Soft.  Musculoskeletal: Normal range of motion.  Neurological: He is alert.  Skin: Skin is warm and dry.  Psychiatric: His speech is normal and behavior is normal. Thought content is not paranoid. He expresses impulsivity. He exhibits a depressed mood. He expresses no homicidal and no suicidal ideation. He exhibits normal recent memory.    Review of Systems  HENT: Negative.   Eyes:  Negative.   Respiratory: Negative.   Cardiovascular: Negative.   Gastrointestinal: Negative.   Musculoskeletal: Negative.   Skin: Negative.   Neurological: Positive for weakness.  Psychiatric/Behavioral: Positive for depression. Negative for hallucinations, memory loss, substance abuse and suicidal ideas. The patient is not nervous/anxious and does not have insomnia.     Blood pressure (!) 157/64, pulse 72, temperature 98.3 F (36.8 C), temperature source Oral, resp. rate 14, height '5\' 5"'$  (1.651 m), weight 68.3 kg (150 lb 9.2 oz), SpO2 96 %.Body mass index is 25.06 kg/m.  General Appearance: Disheveled  Eye Contact:  Fair  Speech:  Slow  Volume:  Decreased  Mood:  Dysphoric  Affect:  Congruent  Thought Process:  Goal Directed  Orientation:  Full (Time, Place, and Person)  Thought Content:  Logical  Suicidal Thoughts:  No  Homicidal Thoughts:  No  Memory:  Immediate;   Good Recent;   Fair Remote;   Fair  Judgement:  Fair  Insight:  Fair  Psychomotor Activity:  Decreased  Concentration:  Concentration: Fair  Recall:  [employer] of Knowledge:  Fair  Language:  Fair  Akathisia:  No  Handed:  Right  AIMS (if indicated):     Assets:  Desire for Improvement Resilience Social Support  ADL's:  Impaired  Cognition:  WNL  Sleep:        Treatment Plan Summary: Medication management and Plan 51 year old man with chronic mild depression. Patient is denying any suicidal ideation. There is no evidence that he had been planning on her trying to do anything to hurt himself. Affect and history appears to be stable from what it was before. Patient does not require suicide precautions. Ordered discontinued. Does not require any change to his antidepressant medicine. I did request a social work consult since he is complaining of severe social deprivation just in case there are any resources he may have overlooked that could be recommended. Otherwise no change to treatment plan. Follow-up as  needed.  Disposition: Patient does not meet criteria for psychiatric inpatient admission. Supportive therapy provided about ongoing stressors.  Alethia Berthold, MD 12/29/2016 1:53 PM

## 2016-12-29 NOTE — Progress Notes (Signed)
Notified MD that pt is still in pain and no meds due at this time. Per MD give 2 mg IV morphine 1 time dose and MD will round on patient.

## 2016-12-29 NOTE — Care Management (Signed)
[facility] HD liaison notified of Admission 

## 2016-12-30 LAB — GLUCOSE, CAPILLARY
GLUCOSE-CAPILLARY: 306 mg/dL — AB (ref 65–99)
GLUCOSE-CAPILLARY: 318 mg/dL — AB (ref 65–99)
Glucose-Capillary: 334 mg/dL — ABNORMAL HIGH (ref 65–99)

## 2016-12-30 LAB — LEVETIRACETAM LEVEL: LEVETIRACETAM: NOT DETECTED ug/mL (ref 10.0–40.0)

## 2016-12-30 MED ORDER — HYDROCODONE-ACETAMINOPHEN 5-325 MG PO TABS: 1.0000 | ORAL_TABLET | Freq: Four times a day (QID) | ORAL | 0 refills | Status: DC | PRN

## 2016-12-30 MED ORDER — HYDROCODONE-ACETAMINOPHEN 5-325 MG PO TABS: 1.0000 | ORAL_TABLET | Freq: Four times a day (QID) | ORAL | 0 refills | Status: AC | PRN

## 2016-12-30 NOTE — Progress Notes (Signed)
Post hd assessment 

## 2016-12-30 NOTE — Progress Notes (Signed)
Pre hd vitals 

## 2016-12-30 NOTE — Progress Notes (Signed)
Hd start 

## 2016-12-30 NOTE — Care Management (Signed)
Patient to discharge home today.  [name] HD liaison notified of plan for discharge.  Patient follows up at [facility].  Patient not to discharge on any new medication.  PT has assessed patient and recommends home health PT, however he does not qualify as he is uninsured.  Patient has been provided [facility] previous [facility].  Patient was about to ambulate 140 with PT.  Patient has walker, cane, and shower seat in the home.  No RNCM needs identified.  RNCM signing off

## 2016-12-30 NOTE — Progress Notes (Signed)
  End of hd 

## 2016-12-30 NOTE — Progress Notes (Signed)
Discharge instructions reviewed by previous nurse with patient and daughter. IV solumedrol given and SL d/c'd. Pt discharged papers signed and white folder given to daughter. Pt had tolerated HS well this evening. RX given pt daughter. Wife and family present at discharge. Assisted to truck via wheel chair and RN, no complaints.

## 2016-12-30 NOTE — Clinical Social Work Note (Signed)
Patient's nurse informed CSW that patient did not wish to speak with CSW prior to discharge. York Spaniel MSW,LCSW [phone]

## 2016-12-30 NOTE — Progress Notes (Signed)
Post hd vitals 

## 2016-12-30 NOTE — Consult Note (Signed)
Patient significantly improved and is close to baseline.    Today is day 3 for steroids.     Past Medical History:  Diagnosis Date  . BPH (benign prostatic hyperplasia)   . CAD (coronary artery disease) of artery bypass graft    a. s/p multipe PCI to LCx and OMI; Last cath in 07/2014 w/ POBA to 70% stenosis in LCx   . Chronic diastolic CHF (congestive heart failure) (HCC)    a. EF 55-60% by echo in 08/2015; Grade 2 DD noted.  . Depression   . Diabetes mellitus without complication (HCC)   . Dialysis patient [facility])    a. Tue/Th/Sat Schedule  . High cholesterol   . Hypertension   . Renal disorder   . Thyroid disease     Past Surgical History:  Procedure Laterality Date  . CARDIAC CATHETERIZATION    . DIALYSIS FISTULA CREATION      History reviewed. No pertinent family history.  Social History:  reports that he has quit smoking. He has never used smokeless tobacco. He reports that he does not drink alcohol or use drugs.  Allergies  Allergen Reactions  . Eicosapentaenoic Acid (Epa) Rash  . Fish-Derived Products Rash    Medications: I have reviewed the patient's current medications.  ROS: History obtained from the patient  General ROS: negative for - chills, fatigue, fever, night sweats, weight gain or weight loss Psychological ROS: negative for - behavioral disorder, hallucinations, memory difficulties, mood swings or suicidal ideation Ophthalmic ROS: negative for - blurry vision, double vision, eye pain or loss of vision ENT ROS: negative for - epistaxis, nasal discharge, oral lesions, sore throat, tinnitus or vertigo Allergy and Immunology ROS: negative for - hives or itchy/watery eyes Hematological and Lymphatic ROS: negative for - bleeding problems, bruising or swollen lymph nodes Endocrine ROS: negative for - galactorrhea, hair pattern changes, polydipsia/polyuria or temperature intolerance Respiratory ROS: negative for - cough, hemoptysis, shortness of breath or  wheezing Cardiovascular ROS: negative for - chest pain, dyspnea on exertion, edema or irregular heartbeat Gastrointestinal ROS: negative for - abdominal pain, diarrhea, hematemesis, nausea/vomiting or stool incontinence Genito-Urinary ROS: ESRD on HD Musculoskeletal ROS: negative for - joint swelling or muscular weakness Neurological ROS: as noted in HPI Dermatological ROS: negative for rash and skin lesion changes  Physical Examination: Blood pressure 132/61, pulse 65, temperature 97.7 F (36.5 C), temperature source Oral, resp. rate 18, height  (1.651 m), weight 68.3 kg (150 lb 9.2 oz), SpO2 92 %.   Neurological Examination   Mental Status: Alert, oriented, thought content appropriate.  Speech fluent without evidence of aphasia.  Able to follow 3 step commands without difficulty. Cranial Nerves: II: Discs flat bilaterally; Visual fields grossly normal, pupils equal, round, reactive to light and accommodation III,IV, VI: ptosis not present, extra-ocular motions intact bilaterally V,VII: smile symmetric, facial light touch sensation normal bilaterally VIII: hearing normal bilaterally IX,X: gag reflex present XI: bilateral shoulder shrug XII: midline tongue extension Motor: Right : Upper extremity   5/5    Left:     Upper extremity   5/5  Lower extremity   5/5     Lower extremity   5/5 Tone and bulk:normal tone throughout; no atrophy noted Sensory: Pinprick and light touch intact throughout, bilaterally Deep Tendon Reflexes: 1+ and symmetric throughout Plantars: Right: downgoing   Left: downgoing Cerebellar: normal finger-to-nose, normal rapid alternating movements and normal heel-to-shin test Gait: not testd      Laboratory Studies:   Basic Metabolic  Panel:  Recent Labs Lab 12/28/16 0627 12/28/16 1652 12/29/16 0504  NA 132*  --  137  K 5.3*  --  5.0  CL 94*  --  95*  CO2 26  --  28  GLUCOSE 76  --  292*  BUN 50*  --  33*  CREATININE 7.07*  --  4.85*  CALCIUM  9.2  --  8.7*  PHOS  --  5.7*  --     Liver Function Tests: No results for input(s): AST, ALT, ALKPHOS, BILITOT, PROT, ALBUMIN in the last 168 hours. No results for input(s): LIPASE, AMYLASE in the last 168 hours. No results for input(s): AMMONIA in the last 168 hours.  CBC:  Recent Labs Lab 12/28/16 0627 12/29/16 0504  WBC 8.7 4.0  NEUTROABS 6.1  --   HGB 9.5* 9.9*  HCT 27.4* 28.9*  MCV 96.9 96.1  PLT 114* 109*    Cardiac Enzymes:  Recent Labs Lab 12/28/16 0627  TROPONINI <0.03    BNP: Invalid input(s): POCBNP  CBG:  Recent Labs Lab 12/29/16 0759 12/29/16 1137 12/29/16 1636 12/29/16 2122 12/30/16 0756  GLUCAP 302* 243* 189* 236* 306*    Microbiology: Results for orders placed or performed during the hospital encounter of 12/28/16  MRSA PCR Screening     Status: None   Collection Time: 12/28/16  4:12 PM  Result Value Ref Range Status   MRSA by PCR NEGATIVE NEGATIVE Final    Comment:        The GeneXpert MRSA Assay (FDA approved for NASAL specimens only), is one component of a comprehensive MRSA colonization surveillance program. It is not intended to diagnose MRSA infection nor to guide or monitor treatment for MRSA infections.     Coagulation Studies: No results for input(s): LABPROT, INR in the last 72 hours.  Urinalysis: No results for input(s): COLORURINE, LABSPEC, PHURINE, GLUCOSEU, HGBUR, BILIRUBINUR, KETONESUR, PROTEINUR, UROBILINOGEN, NITRITE, LEUKOCYTESUR in the last 168 hours.  Invalid input(s): APPERANCEUR  Lipid Panel:  No results found for: CHOL, TRIG, HDL, CHOLHDL, VLDL, LDLCALC  HgbA1C:  Lab Results  Component Value Date   HGBA1C 9.1 (H) 04/14/2016    Urine Drug Screen:  No results found for: LABOPIA, COCAINSCRNUR, LABBENZ, AMPHETMU, THCU, LABBARB  Alcohol Level: No results for input(s): ETH in the last 168 hours.   Mr Brain 71 Contrast  Result Date: 12/28/2016 CLINICAL DATA:  51 year old male with sudden onset of  bilateral leg weakness. The patient currently is able to stand, and can slightly lift his legs against gravity, but is too weak to walk independently. Symptoms upon waking today, reportedly was normal yesterday. Dialysis patient. EXAM: MRI HEAD WITHOUT CONTRAST TECHNIQUE: Multiplanar, multiecho pulse sequences of the brain and surrounding structures were obtained without intravenous contrast. COMPARISON:  Thoracic and lumbar spine MRI from today reported separately. Head CT without contrast 0809 hours today. FINDINGS: Brain: No restricted diffusion to suggest acute infarction. No midline shift, mass effect, evidence of mass lesion, ventriculomegaly, extra-axial collection or acute intracranial hemorrhage. Cervicomedullary junction and pituitary are within normal limits. Moderate size chronic lacunar infarct of the right paracentral pons. Lower brainstem and cervicomedullary junction appear within normal limits. Elsewhere gray and white matter signal are within normal limits. No chronic cerebral blood products. Vascular: Major intracranial vascular flow voids are preserved. Skull and upper cervical spine: Appear within normal limits. Normal bone marrow signal. Sinuses/Orbits: Postoperative changes to both globes. Trace paranasal sinus mucosal thickening. Other: Mastoids are clear.  Negative scalp soft tissues. IMPRESSION:  1.  No acute intracranial abnormality. 2. 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 3. See thoracic spine MRI today reported separately. Electronically Signed   By: Odessa Fleming M.D.   On: 12/28/2016 12:42   Mr Thoracic Spine Wo Contrast  Result Date: 12/28/2016 CLINICAL DATA:  51 year old male with sudden onset of bilateral leg weakness. The patient currently is able to stand, and can slightly lift his legs against gravity, but is too weak to walk independently. Symptoms upon waking today, reportedly was normal yesterday. Dialysis patient. EXAM: MRI THORACIC SPINE WITHOUT CONTRAST  TECHNIQUE: Multiplanar, multisequence MR imaging of the thoracic spine was performed. No intravenous contrast was administered. COMPARISON:  Lumbar MRI from today reported separately. FINDINGS: Limited sagittal imaging the cervical spine: Negative; suggestion of cervical spinal stenosis. Thoracic spine segmentation: Normal, and appears concordant with the lumbar study today reported separately. Alignment: Within normal limits, mild straightening of thoracic kyphosis. Vertebrae: No marrow edema or evidence of acute osseous abnormality. Visualized bone marrow signal is within normal limits. Cord: There is no thoracic spinal cord expansion, and is difficult to discern abnormal spinal cord signal on sagittal T1 or T2 weighted images, however, on axial T2 (series 8, image 28) and sagittal STIR imaging there is widespread abnormal T2 and STIR signal throughout the thoracic spinal cord. The signal appears primarily within the central gray matter, but does not resemble a syrinx. The signal abnormality is most pronounced below the T7 vertebral body, and appears nearly holo cord just above the conus which is at T12. There is no associated thoracic spinal canal stenosis or cord mass effect. No abnormal flow voids within the thoracic thecal sac. Paraspinal and other soft tissues: Small layering and loculated bilateral pleural effusions. Otherwise negative visualized thoracic viscera. Visible upper abdominal viscera remarkable for decreased T2 signal in both the liver and spleen such as due to hemosiderosis. Disc levels: Mild for age thoracic spine degeneration. Minimal T7-T8 disc bulging. No other significant thoracic disc disease. Intermittent thoracic facet hypertrophy, but no thoracic spinal stenosis. IMPRESSION: 1. Widespread abnormal signal within the substance of the thoracic spinal cord, appears to most affect the central gray matter, and is most apparent from T7 to the conus. No cord expansion, discrete cord tumor or  spinal stenosis. Top differential considerations include spinal cord infarct and inflammatory myelitis. 2. No thoracic spinal stenosis or acute osseous abnormality. 3. Small layering and loculated bilateral pleural effusions. 4. 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 5. Study discussed by telephone with Dr. Nita Sickle on 12/28/2016 at 1235 hours. Electronically Signed   By: Odessa Fleming M.D.   On: 12/28/2016 12:40     Assessment/Plan:  51 y.o. male  a known history of ESRD on hemodialysis, CAD status post stents and diabetes who presents with bilateral lower extremity weakness. Patient reports that he has been in his usual state of health. yesteray morning when he woke up both of his legs were weak and he was unable to walk. He went to dialysis and upon arrival to the dialysis center patient was evaluated rather dialysis nurse and due to his profound bilateral lower extremity weakness was sent to the emergency room for further evaluation. On MRI T spine pt has abnormal signal intensity from T7 to conus.   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  Pt does not have a sensory level.  12/30/2016, 11:11 AM

## 2016-12-30 NOTE — Progress Notes (Signed)
Inpatient Diabetes Program Recommendations  AACE/ADA: New Consensus Statement on Inpatient Glycemic Control (2015)  Target Ranges:  Prepandial:   less than 140 mg/dL      Peak postprandial:   less than 180 mg/dL (1-2 hours)      Critically ill patients:  140 - 180 mg/dL   Lab Results  Component Value Date   GLUCAP 306 (H) 12/30/2016   HGBA1C 9.1 (H) 04/14/2016  A1C: Per Care Everywhere, last A1C was 7.2% on 12/14/16   Review of Glycemic Control  Results for Marcus, [name] (MRN [id number]) as of 12/30/2016 08:12  Ref. Range 12/29/2016 07:59 12/29/2016 11:37 12/29/2016 16:36 12/29/2016 21:22 12/30/2016 07:56  Glucose-Capillary Latest Ref Range: 65 - 99 mg/dL 829 (H) 562 (H) 130 (H) 236 (H) 306 (H)    Diabetes history: DM Outpatient Diabetes medications: NPH 10 units QAM, NPH 10 units QHS Current orders for Inpatient glycemic control: Lantus 5 units daily, Novolog 0-20 units TID with meals * solumedrol   Inpatient Diabetes Program Recommendations: Please consider increasing Lantus to 5 units BID.  Please consider decreasing Novolog correction scale to Moderate scale (Novolog 0-15 units) and adding Novolog 0-5 units QHS for bedtime correction.  If steroids are continued, please consider ordering Novolog 4 units TID with meals for meal coverage.   Marcus Racer, RN, BA, MHA, CDE Diabetes Coordinator Inpatient Diabetes Program  [phone] (Team Pager) [phone] [facility] Office) 12/30/2016 8:14 AM

## 2016-12-30 NOTE — Progress Notes (Signed)
Pre hd assessment  

## 2016-12-30 NOTE — Discharge Summary (Addendum)
[facility] [place] [facility]   PATIENT NAME: Marcus [name]    MR#:  [id number]  DATE OF BIRTH:  1966/07/19  DATE OF ADMISSION:  12/28/2016 ADMITTING PHYSICIAN: Adrian Saran, MD  DATE OF DISCHARGE: 12/30/2016  PRIMARY CARE PHYSICIAN: [facility] [place]    ADMISSION DIAGNOSIS:  Acute transverse myelitis (HCC) [G37.3] Bilateral leg weakness [R29.898] Weakness of lower extremity [R29.898]  DISCHARGE DIAGNOSIS:  Principal Problem:   Adjustment disorder with mixed anxiety and depressed mood Active Problems:   Weakness   Dysthymia   Transverse myelitis.  SECONDARY DIAGNOSIS:   Past Medical History:  Diagnosis Date  . BPH (benign prostatic hyperplasia)   . CAD (coronary artery disease) of artery bypass graft    a. s/p multipe PCI to LCx and OMI; Last cath in 07/2014 w/ POBA to 70% stenosis in LCx   . Chronic diastolic CHF (congestive heart failure) (HCC)    a. EF 55-60% by echo in 08/2015; Grade 2 DD noted.  . Depression   . Diabetes mellitus without complication (HCC)   . Dialysis patient [facility])    a. Tue/Th/Sat Schedule  . High cholesterol   . Hypertension   . Renal disorder   . Thyroid disease     HOSPITAL COURSE:   1. Bilateral lower extremity weakness with differential including transverse myelitis versus spinal cord infarct from hypotension. Continue Solu-Medrol 500 mg IV 3 times a day. Neurology consultation appreciated. Neuro checks every 4 hours Monitor blood pressure Monitor signs and symptoms of respiratory compromise if this is transverse myelitis. Significant improvement with IV steroid for 2 day.  walked 140 steps with PT yesterday, and even better today.  As per CM- he have 2 walkers and a wheelchair at home, also have bedside commode.  2. ESRD and hemodialysis: Consulted nephrology for dialysis  3. Hyperkalemia: Patient will need dialysis  4Diabetes: Patient will need high-dose sliding scale insulin with  diabetes coordinator consultation.  5. Essential hypertension: Continue labetalol, Imdur, Norvasc Avapro, hydralazine, clonidine,Cardura and Coreg  6. Hyperlipidemia: Continue statin  7. Depression: Continue Prozac   As there was some concern of suicidal tendency few days ago, psychiatric consult was called in.   As per them, not suicidal now- and advised to continue all home meds.  8. Hypothyroid: Continue Synthroid  9. CAD: Continue aspirin, beta blocker, statin and Plavix   DISCHARGE CONDITIONS:   Stable.  CONSULTS OBTAINED:  Treatment Team:  Pauletta Browns, MD Mady Haagensen, MD Audery Amel, MD  DRUG ALLERGIES:   Allergies  Allergen Reactions  . Eicosapentaenoic Acid (Epa) Rash  . Fish-Derived Products Rash    DISCHARGE MEDICATIONS:   Current Discharge Medication List    START taking these medications   Details  HYDROcodone-acetaminophen (NORCO/VICODIN) 5-325 MG tablet Take 1 tablet by mouth every 6 (six) hours as needed for moderate pain. Qty: 20 tablet, Refills: 0      CONTINUE these medications which have NOT CHANGED   Details  amLODipine (NORVASC) 10 MG tablet Take 10 mg by mouth daily.    aspirin EC 81 MG tablet Take 81 mg by mouth daily.    atorvastatin (LIPITOR) 80 MG tablet Take 80 mg by mouth at bedtime.    calcitRIOL (ROCALTROL) 0.5 MCG capsule Take 1 mcg by mouth 3 (three) times a week. Take 1 mcg by mouth 3 (three) times a week with dialysis.    calcium acetate (PHOSLO) 667 MG capsule Take 667 mg by mouth 3 (three)  times daily with meals.     carbamazepine (CARBATROL) 200 MG 12 hr capsule Take 200 mg by mouth 2 (two) times daily.    carvedilol (COREG) 25 MG tablet Take 25 mg by mouth 2 (two) times daily.    cloNIDine (CATAPRES) 0.1 MG tablet Take 0.1 mg by mouth 2 (two) times daily.    clopidogrel (PLAVIX) 75 MG tablet Take 75 mg by mouth at bedtime.    doxazosin (CARDURA) 1 MG tablet Take 1 mg by mouth at bedtime.     FLUoxetine (PROZAC) 40 MG capsule Take 40 mg by mouth daily.    gabapentin (NEURONTIN) 100 MG capsule Take 100 mg by mouth 3 (three) times daily.     hydrALAZINE (APRESOLINE) 100 MG tablet Take 1 tablet (100 mg total) by mouth 3 (three) times daily. Qty: 90 tablet, Refills: 0    insulin NPH Human (HUMULIN N,NOVOLIN N) 100 UNIT/ML injection Inject 5-10 Units into the skin 2 (two) times daily. 5 units in the morning and 10 units at night.    isosorbide mononitrate (IMDUR) 60 MG 24 hr tablet Take 90 mg by mouth daily.    levothyroxine (SYNTHROID, LEVOTHROID) 100 MCG tablet Take 100 mcg by mouth daily before breakfast.    nitroGLYCERIN (NITROSTAT) 0.4 MG SL tablet Take 0.4 mg by mouth every 5 (five) minutes as needed.    omeprazole (PRILOSEC) 40 MG capsule Take 40 mg by mouth daily.    traZODone (DESYREL) 50 MG tablet Take 25 mg by mouth at bedtime.    !! insulin aspart protamine- aspart (NOVOLOG MIX 70/30) (70-30) 100 UNIT/ML injection Inject 0.05 mLs (5 Units total) into the skin daily with breakfast. Qty: 10 mL, Refills: 11    !! insulin aspart protamine- aspart (NOVOLOG MIX 70/30) (70-30) 100 UNIT/ML injection Inject 0.1 mLs (10 Units total) into the skin daily with supper. Qty: 10 mL, Refills: 11    irbesartan (AVAPRO) 300 MG tablet Take 1 tablet (300 mg total) by mouth daily after supper. Qty: 60 tablet, Refills: 0     !! - Potential duplicate medications found. Please discuss with provider.    STOP taking these medications     labetalol (NORMODYNE) 300 MG tablet          DISCHARGE INSTRUCTIONS:    Follow with PMD in 1-2 weeks.  If you experience worsening of your admission symptoms, develop shortness of breath, life threatening emergency, suicidal or homicidal thoughts you must seek medical attention immediately by calling 911 or calling your MD immediately  if symptoms less severe.  You Must read complete instructions/literature along with all the possible adverse  reactions/side effects for all the Medicines you take and that have been prescribed to you. Take any new Medicines after you have completely understood and accept all the possible adverse reactions/side effects.   Please note  You were cared for by a hospitalist during your hospital stay. If you have any questions about your discharge medications or the care you received while you were in the hospital after you are discharged, you can call the unit and asked to speak with the hospitalist on call if the hospitalist that took care of you is not available. Once you are discharged, your primary care physician will handle any further medical issues. Please note that NO REFILLS for any discharge medications will be authorized once you are discharged, as it is imperative that you return to your primary care physician (or establish a relationship with a primary care physician if  you do not have one) for your aftercare needs so that they can reassess your need for medications and monitor your lab values.    Today   CHIEF COMPLAINT:   Chief Complaint  Patient presents with  . Extremity Weakness    HISTORY OF PRESENT ILLNESS:  Marcus [name]  is a 51 y.o. male with a known history of ESRD on hemodialysis, CAD status post stents and diabetes who presents with bilateral lower extremity weakness. Patient reports that he has been in his usual state of health. This morning when he woke up both of his legs were weak and he was unable to walk. He went to dialysis and upon arrival to the dialysis center patient was evaluated rather dialysis nurse and due to his profound bilateral lower extremity weakness was sent to the emergency room for further evaluation. A she denies any recent falls, several anesthesia or numbness or tingling of lower extremities. No bowel or bladder incontinence.  He was evaluated by neurosurgery while in the emergency room and case was discussed by the ER physician to the neurologist.  MRI of the lumbar spine showing possible thoracic central cord infarct versus transverse myelitis. Neurologist recommended starting Solu-Medrol 500 mg IV 3 times a day. Currently patient is not having any symptoms of shortness of breath or breathing issues.  When the neurosurgeon came to evaluate the patient emergency room it appeared that patient had full strength of his lower extremity. It is questionable if patient's blood pressure was low causing a thoracic central cord infarct. He has also received 500 mg of IV Solu-Medrol.   VITAL SIGNS:  Blood pressure 132/61, pulse 65, temperature 97.7 F (36.5 C), temperature source Oral, resp. rate 18, height  (1.651 m), weight 68.3 kg (150 lb 9.2 oz), SpO2 92 %.  I/O:    Intake/Output Summary (Last 24 hours) at 12/30/16 1332 Last data filed at 12/30/16 0900  Gross per 24 hour  Intake              829 ml  Output                0 ml  Net              829 ml    PHYSICAL EXAMINATION:   GENERAL:  51 y.o.-year-old patient lying in the bed with no acute distress.  EYES: Pupils equal, round, reactive to light and accommodation. No scleral icterus. Extraocular muscles intact.  HEENT: Head atraumatic, normocephalic. Oropharynx and nasopharynx clear.  NECK:  Supple, no jugular venous distention. No thyroid enlargement, no tenderness.  LUNGS: Normal breath sounds bilaterally, no wheezing, rales,rhonchi or crepitation. No use of accessory muscles of respiration.  CARDIOVASCULAR: S1, S2 normal. No murmurs, rubs, or gallops.  ABDOMEN: Soft, nontender, nondistended. Bowel sounds present. No organomegaly or mass.  EXTREMITIES: No pedal edema, cyanosis, or clubbing.  NEUROLOGIC: Cranial nerves II through XII are intact. Muscle strength 5/5 in Upper extremities, 5/5 in lower extremities. Sensation intact. Gait not checked.  PSYCHIATRIC: The patient is alert and oriented x 3.  SKIN: No obvious rash, lesion, or ulcer.    DATA REVIEW:   CBC  Recent  Labs Lab 12/29/16 0504  WBC 4.0  HGB 9.9*  HCT 28.9*  PLT 109*    Chemistries   Recent Labs Lab 12/29/16 0504  NA 137  K 5.0  CL 95*  CO2 28  GLUCOSE 292*  BUN 33*  CREATININE 4.85*  CALCIUM 8.7*    Cardiac  Enzymes  Recent Labs Lab 12/28/16 0627  TROPONINI <0.03    Microbiology Results  Results for orders placed or performed during the hospital encounter of 12/28/16  MRSA PCR Screening     Status: None   Collection Time: 12/28/16  4:12 PM  Result Value Ref Range Status   MRSA by PCR NEGATIVE NEGATIVE Final    Comment:        The GeneXpert MRSA Assay (FDA approved for NASAL specimens only), is one component of a comprehensive MRSA colonization surveillance program. It is not intended to diagnose MRSA infection nor to guide or monitor treatment for MRSA infections.     RADIOLOGY:  No results found.  EKG:   Orders placed or performed during the hospital encounter of 12/28/16  . EKG 12-Lead  . EKG 12-Lead      Management plans discussed with the patient, family and they are in agreement.  CODE STATUS:     Code Status Orders        Start     Ordered   12/28/16 1555  Do not attempt resuscitation (DNR)  Continuous    Question Answer Comment  In the event of cardiac or respiratory ARREST Do not call a "code blue"   In the event of cardiac or respiratory ARREST Do not perform Intubation, CPR, defibrillation or ACLS   In the event of cardiac or respiratory ARREST Use medication by any route, position, wound care, and other measures to relive pain and suffering. May use oxygen, suction and manual treatment of airway obstruction as needed for comfort.      12/28/16 1554    Code Status History    Date Active Date Inactive Code Status Order ID Comments User Context   04/13/2016 11:19 AM 04/13/2016  3:05 PM Full Code [id number]  Caryn Bee, RN Inpatient   04/12/2016  4:53 PM 04/13/2016 11:19 AM Full Code [id number]  Katha Hamming, MD ED    04/02/2016  8:57 PM 04/03/2016 10:40 PM Full Code [id number]  Ramonita Lab, MD Inpatient   10/21/2015 10:27 AM 10/23/2015  8:21 PM Full Code [id number]  Adrian Saran, MD Inpatient      TOTAL TIME TAKING CARE OF THIS PATIENT: 35 minutes.    Altamese Dilling M.D on 12/30/2016 at 1:32 PM  Between 7am to 6pm - Pager - [phone]  After 6pm go to [email or web address] - Social research officer, government  [facility]  Office  [phone]  CC: Primary care physician; [facility] [place] [facility] [place]   Note: This dictation was prepared with Dragon dictation along with smaller phrase technology. Any transcriptional errors that result from this process are unintentional.

## 2016-12-30 NOTE — Progress Notes (Signed)
[facility]  ROUNDING NOTE   Subjective:  Patient due for hemodialysis today.   Orders have been prepared. His lower extremity weakness is significantly improved.   Objective:  Vital signs in last 24 hours:  Temp:  [97.7 F (36.5 C)] 97.7 F (36.5 C) (04/25 2029) Pulse Rate:  [65-68] 65 (04/25 2029) Resp:  [18] 18 (04/25 2029) BP: (116-145)/(48-67) 132/61 (04/25 2029) SpO2:  [92 %-94 %] 92 % (04/25 2029)  Weight change:  Filed Weights   12/28/16 0622 12/28/16 1637 12/28/16 1959  Weight: 74.4 kg (164 lb) 69 kg (152 lb 1.9 oz) 68.3 kg (150 lb 9.2 oz)    Intake/Output: I/O last 3 completed shifts: In: 999 [P.O.:840; IV Piggyback:159] Out: 1506 [Urine:3; Other:1500; Stool:3]   Intake/Output this shift:  Total I/O In: 240 [P.O.:240] Out: 0   Physical Exam: General: No acute distress  Head: Normocephalic, atraumatic. Moist oral mucosal membranes  Eyes: Anicteric  Neck: Supple, trachea midline  Lungs:  Clear to auscultation, normal effort  Heart: S1S2 no rubs  Abdomen:  Soft, nontender, bowel sounds present  Extremities: Trace peripheral edema.  Neurologic: Nonfocal, moving all four extremities  Skin: No lesions  Access: LUE AVF    Basic Metabolic Panel:  Recent Labs Lab 12/28/16 0627 12/28/16 1652 12/29/16 0504  NA 132*  --  137  K 5.3*  --  5.0  CL 94*  --  95*  CO2 26  --  28  GLUCOSE 76  --  292*  BUN 50*  --  33*  CREATININE 7.07*  --  4.85*  CALCIUM 9.2  --  8.7*  PHOS  --  5.7*  --     Liver Function Tests: No results for input(s): AST, ALT, ALKPHOS, BILITOT, PROT, ALBUMIN in the last 168 hours. No results for input(s): LIPASE, AMYLASE in the last 168 hours. No results for input(s): AMMONIA in the last 168 hours.  CBC:  Recent Labs Lab 12/28/16 0627 12/29/16 0504  WBC 8.7 4.0  NEUTROABS 6.1  --   HGB 9.5* 9.9*  HCT 27.4* 28.9*  MCV 96.9 96.1  PLT 114* 109*    Cardiac Enzymes:  Recent Labs Lab 12/28/16 0627   TROPONINI <0.03    BNP: Invalid input(s): POCBNP  CBG:  Recent Labs Lab 12/29/16 1636 12/29/16 2122 12/30/16 0756 12/30/16 1203 12/30/16 1312  GLUCAP 189* 236* 306* 334* 318*    Microbiology: Results for orders placed or performed during the hospital encounter of 12/28/16  MRSA PCR Screening     Status: None   Collection Time: 12/28/16  4:12 PM  Result Value Ref Range Status   MRSA by PCR NEGATIVE NEGATIVE Final    Comment:        The GeneXpert MRSA Assay (FDA approved for NASAL specimens only), is one component of a comprehensive MRSA colonization surveillance program. It is not intended to diagnose MRSA infection nor to guide or monitor treatment for MRSA infections.     Coagulation Studies: No results for input(s): LABPROT, INR in the last 72 hours.  Urinalysis: No results for input(s): COLORURINE, LABSPEC, PHURINE, GLUCOSEU, HGBUR, BILIRUBINUR, KETONESUR, PROTEINUR, UROBILINOGEN, NITRITE, LEUKOCYTESUR in the last 72 hours.  Invalid input(s): APPERANCEUR    Imaging: No results found.   Medications:   . sodium chloride    . sodium chloride    . methylPREDNISolone (SOLU-MEDROL) injection 500 mg (12/30/16 1112)   . amLODipine  10 mg Oral Daily  . aspirin EC  81 mg Oral Daily  .  atorvastatin  80 mg Oral QHS  . calcitRIOL  1 mcg Oral Once per day on Mon Wed Fri  . calcium acetate  667 mg Oral TID WC  . carbamazepine  200 mg Oral BID  . carvedilol  25 mg Oral BID  . cloNIDine  0.1 mg Oral BID  . clopidogrel  75 mg Oral QHS  . doxazosin  1 mg Oral QHS  . epoetin (EPOGEN/PROCRIT) injection  4,000 Units Intravenous Q T,Th,Sa-HD  . FLUoxetine  40 mg Oral Daily  . gabapentin  100 mg Oral TID  . heparin  5,000 Units Subcutaneous Q8H  . hydrALAZINE  100 mg Oral TID  . insulin aspart  0-20 Units Subcutaneous TID WC  . insulin glargine  5 Units Subcutaneous Daily  . irbesartan  300 mg Oral QPC supper  . isosorbide mononitrate  90 mg Oral Daily  .  labetalol  300 mg Oral BID  . levothyroxine  100 mcg Oral QAC breakfast  . pantoprazole  40 mg Oral Daily  . sodium chloride flush  3 mL Intravenous Q12H  . traZODone  25 mg Oral QHS   sodium chloride, sodium chloride, acetaminophen **OR** acetaminophen, alteplase, bisacodyl, heparin, hydrALAZINE, HYDROcodone-acetaminophen, lidocaine (PF), lidocaine-prilocaine, ondansetron **OR** ondansetron (ZOFRAN) IV, pentafluoroprop-tetrafluoroeth, senna-docusate  Assessment/ Plan:  51 y.o. male  with uncontrolled diabetes mellitus type II, hypertension, hyperlipidemia, hypothyroidism, BPH, coronary artery disease status post stent, ESRD on HD, Anemia of CKD, SHPTH.   [name]  1. End stage renal disease TTHS: patient due for hemodialysis today.  Orders have been prepared.  2. Anemia of CKD:   administer Epogen 4000 units IV with dialysis today.  3.  Secondary hyperparathyroidism: Repeat phosphorus with dialysis today.  4. Hypertension: Blood pressure currently well-controlled at 132/61. Continue amlodipine, carvedilol, clonidine, hydralazine, Imdur, and labetalol.  5. Lower extremity weakness. Changes noted on thoracic spine.  Weakness has significantly improved since admission.  Plan per neurology and hospitalist.   LOS: 2 Marcus [name] 4/26/20182:03 PM

## 2017-03-17 ENCOUNTER — Emergency Department
Admission: EM | Admit: 2017-03-17 | Discharge: 2017-03-17 | Disposition: A | Discharge status: Home / Self Care | Payer: Self-pay | Attending: Emergency Medicine | Admitting: Emergency Medicine

## 2017-03-17 ENCOUNTER — Emergency Department: Payer: Self-pay

## 2017-03-17 ENCOUNTER — Encounter: Payer: Self-pay | Admitting: Emergency Medicine

## 2017-03-17 DIAGNOSIS — Z794 Long term (current) use of insulin: Secondary | ICD-10-CM | POA: Insufficient documentation

## 2017-03-17 DIAGNOSIS — Z79899 Other long term (current) drug therapy: Secondary | ICD-10-CM | POA: Insufficient documentation

## 2017-03-17 DIAGNOSIS — H409 Unspecified glaucoma: Secondary | ICD-10-CM

## 2017-03-17 DIAGNOSIS — Z7902 Long term (current) use of antithrombotics/antiplatelets: Secondary | ICD-10-CM | POA: Insufficient documentation

## 2017-03-17 DIAGNOSIS — I2510 Atherosclerotic heart disease of native coronary artery without angina pectoris: Secondary | ICD-10-CM | POA: Insufficient documentation

## 2017-03-17 DIAGNOSIS — R0789 Other chest pain: Secondary | ICD-10-CM

## 2017-03-17 DIAGNOSIS — I5032 Chronic diastolic (congestive) heart failure: Secondary | ICD-10-CM | POA: Insufficient documentation

## 2017-03-17 DIAGNOSIS — H1131 Conjunctival hemorrhage, right eye: Secondary | ICD-10-CM

## 2017-03-17 DIAGNOSIS — E079 Disorder of thyroid, unspecified: Secondary | ICD-10-CM | POA: Insufficient documentation

## 2017-03-17 DIAGNOSIS — Z7982 Long term (current) use of aspirin: Secondary | ICD-10-CM | POA: Insufficient documentation

## 2017-03-17 DIAGNOSIS — Z992 Dependence on renal dialysis: Secondary | ICD-10-CM | POA: Insufficient documentation

## 2017-03-17 DIAGNOSIS — Z87891 Personal history of nicotine dependence: Secondary | ICD-10-CM | POA: Insufficient documentation

## 2017-03-17 DIAGNOSIS — E119 Type 2 diabetes mellitus without complications: Secondary | ICD-10-CM | POA: Insufficient documentation

## 2017-03-17 LAB — BASIC METABOLIC PANEL
ANION GAP: 13 (ref 5–15)
BUN: 19 mg/dL (ref 6–20)
CALCIUM: 9 mg/dL (ref 8.9–10.3)
CO2: 30 mmol/L (ref 22–32)
Chloride: 98 mmol/L — ABNORMAL LOW (ref 101–111)
Creatinine, Ser: 2.9 mg/dL — ABNORMAL HIGH (ref 0.61–1.24)
GFR, EST AFRICAN AMERICAN: 27 mL/min — AB (ref >60)
GFR, EST NON AFRICAN AMERICAN: 23 mL/min — AB (ref >60)
GLUCOSE: 112 mg/dL — AB (ref 65–99)
POTASSIUM: 3.7 mmol/L (ref 3.5–5.1)
Sodium: 141 mmol/L (ref 135–145)

## 2017-03-17 LAB — CBC
HEMATOCRIT: 35.1 % — AB (ref 40.0–52.0)
HEMOGLOBIN: 11.8 g/dL — AB (ref 13.0–18.0)
MCH: 33.7 pg (ref 26.0–34.0)
MCHC: 33.8 g/dL (ref 32.0–36.0)
MCV: 99.8 fL (ref 80.0–100.0)
Platelets: 147 10*3/uL — ABNORMAL LOW (ref 150–440)
RBC: 3.51 MIL/uL — AB (ref 4.40–5.90)
RDW: 18.8 % — ABNORMAL HIGH (ref 11.5–14.5)
WBC: 5.9 10*3/uL (ref 3.8–10.6)

## 2017-03-17 LAB — ETHANOL: Alcohol, Ethyl (B): <5 mg/dL (ref <5)

## 2017-03-17 LAB — SALICYLATE LEVEL

## 2017-03-17 LAB — ACETAMINOPHEN LEVEL

## 2017-03-17 LAB — TROPONIN I

## 2017-03-17 MED ORDER — LATANOPROST 0.005 % OP SOLN: 1.0000 [drp] | Freq: Every day | OPHTHALMIC | 0 refills | Status: AC

## 2017-03-17 MED ORDER — ONDANSETRON 4 MG PO TBDP
ORAL_TABLET | ORAL | Status: AC
  Filled 2017-03-17: qty 1

## 2017-03-17 MED ORDER — ACETAMINOPHEN 500 MG PO TABS
1000.0000 mg | ORAL_TABLET | Freq: Once | ORAL | Status: AC
  Administered 2017-03-17: 1000 mg via ORAL
  Filled 2017-03-17: qty 2

## 2017-03-17 MED ORDER — TETRACAINE HCL 0.5 % OP SOLN
2.0000 [drp] | Freq: Once | OPHTHALMIC | Status: AC
  Administered 2017-03-17: 2 [drp] via OPHTHALMIC
  Filled 2017-03-17: qty 4

## 2017-03-17 MED ORDER — FENTANYL CITRATE (PF) 100 MCG/2ML IJ SOLN
50.0000 ug | Freq: Once | INTRAMUSCULAR | Status: AC
  Administered 2017-03-17: 50 ug via INTRAVENOUS
  Filled 2017-03-17: qty 2

## 2017-03-17 MED ORDER — ONDANSETRON 4 MG PO TBDP
8.0000 mg | ORAL_TABLET | Freq: Once | ORAL | Status: AC
  Administered 2017-03-17: 8 mg via ORAL
  Filled 2017-03-17: qty 2

## 2017-03-17 MED ORDER — LATANOPROST 0.005 % OP SOLN
1.0000 [drp] | Freq: Every day | OPHTHALMIC | Status: DC
  Filled 2017-03-17: qty 2.5

## 2017-03-17 NOTE — Progress Notes (Signed)
Patient sent from chronic unit with arterial and venous needles accessed,both lines capped and clamped.Deaccessed sites,pressure held for 10minutes until Merrill Lynchhemastasis achieved,sites dressed with gauze/taped.Patient tolerated well.

## 2017-03-17 NOTE — ED Notes (Signed)
Interpreter arrived to discharge pt and pt daughter was on the phone with pt spouse - she was very agitated that her father was being discharged in "severe" pain and that we needed to treat his eye pain - she states he got no treatment yesterday and he cannot be discharged - Dr Lamont Snowballifenbark notified and went to see pt

## 2017-03-17 NOTE — ED Notes (Signed)
Patient with SI. Patient states, "I don't deserve to be in this world". Patient denies plan. Patient verbally contracted with this RN to not hurt himself while he is here.

## 2017-03-17 NOTE — ED Triage Notes (Signed)
Patient is spanish speaking. Interpreter paged. Per EMS, patient comes from dialysis with c/o CP and right eye pain. Patient finished 1/2 of his treatment, patient is still accessed. Patient right eye is blood shot. Bruising noted. [facility] Patient states he was discharged with rx for pain and drops for his eyes but he has not had them filled.

## 2017-03-17 NOTE — ED Notes (Signed)
Dialysis arrived to room to deaccess pt

## 2017-03-17 NOTE — Discharge Instructions (Signed)
Please go back to dialysis today to complete the rest of your treatment. Make sure you begin taking all of your glaucoma medications and keep your appointment with a glaucoma specialist this coming Monday as scheduled.  It was a pleasure to take care of you today, and thank you for coming to our emergency department.  If you have any questions or concerns before leaving please ask the nurse to grab me and I'm more than happy to go through your aftercare instructions again.  If you were prescribed any opioid pain medication today such as Norco, Vicodin, Percocet, morphine, hydrocodone, or oxycodone please make sure you do not drive when you are taking this medication as it can alter your ability to drive safely.  If you have any concerns once you are home that you are not improving or are in fact getting worse before you can make it to your follow-up appointment, please do not hesitate to call 911 and come back for further evaluation.  Merrily BrittleNeil Cristan Scherzer MD  Results for orders placed or performed during the hospital encounter of 03/17/17  Basic metabolic panel  Result Value Ref Range   Sodium 141 135 - 145 mmol/L   Potassium 3.7 3.5 - 5.1 mmol/L   Chloride 98 (L) 101 - 111 mmol/L   CO2 30 22 - 32 mmol/L   Glucose, Bld 112 (H) 65 - 99 mg/dL   BUN 19 6 - 20 mg/dL   Creatinine, Ser 1.612.90 (H) 0.61 - 1.24 mg/dL   Calcium 9.0 8.9 - 09.610.3 mg/dL   GFR calc non Af Amer 23 (L) >60 mL/min   GFR calc Af Amer 27 (L) >60 mL/min   Anion gap 13 5 - 15  CBC  Result Value Ref Range   WBC 5.9 3.8 - 10.6 K/uL   RBC 3.51 (L) 4.40 - 5.90 MIL/uL   Hemoglobin 11.8 (L) 13.0 - 18.0 g/dL   HCT 04.535.1 (L) 40.940.0 - 81.152.0 %   MCV 99.8 80.0 - 100.0 fL   MCH 33.7 26.0 - 34.0 pg   MCHC 33.8 32.0 - 36.0 g/dL   RDW 91.418.8 (H) 78.211.5 - 95.614.5 %   Platelets 147 (L) 150 - 440 K/uL  Troponin I  Result Value Ref Range   Troponin I <0.03 <0.03 ng/mL   Dg Chest 2 View  Result Date: 03/17/2017 CLINICAL DATA:  Chest pain, RIGHT eye  pain, just finished dialysis, history hypertension, coronary artery disease, diabetes mellitus, chronic systolic CHF EXAM: CHEST  2 VIEW COMPARISON:  04/12/2016 FINDINGS: Enlargement of cardiac silhouette with pulmonary vascular congestion. Coronary stents noted. Mediastinal contours normal. Atherosclerotic calcification aortic arch. Mild diffuse pulmonary infiltrates bilaterally consistent with CHF/fluid overload. Tiny RIGHT pleural effusion. No pneumothorax. Bones demineralized. IMPRESSION: Diffuse infiltrates likely pulmonary edema question CHF versus fluid overload. Electronically Signed   By: Ulyses SouthwardMark  Boles M.D.   On: 03/17/2017 10:08

## 2017-03-17 NOTE — ED Provider Notes (Signed)
[facility] Emergency Department Provider Note  ____________________________________________   First MD Initiated Contact with Patient 03/17/17 [phone]     (approximate)  I have reviewed the triage vital signs and the nursing notes.   HISTORY  Chief Complaint Chest Pain    HPI Marcus [name] is a 51 y.o. male who comes to the emergency department via EMS with atypical chest pain. He was receiving hemodialysis today and was about 2 or 2-1/2 hours into it when he noted sharp left sided chest pain. Not associated with shortness of breath. He mentioned it to the people at the dialysis center who called 911. He asked them not to call 911 and said he did not want to go to the hospital, however they insisted so he came.He denies current change in his vision. He denies headache. He denies nausea or vomiting. He does have a significant past medical history of coronary artery bypass graft although he says this chest pain does not feel the same.   03/16/17 Ophtho note:  SCH OD: -- Noted after an episode of retractable vomiting secondary to viral gastroenteritis -- Presented to the ED with HA, nausea and abdominal pain - sx has gotten better with fluids and pain medications -- Denies temporal headaches or tenderness to palpation, denies jaw claudication or proximal muscle weakness -- VA stable, IOP noted to be 28, 33 on arrival and then (21 and 24 ) on recheck -- [facility] 360, no corneal edema noted, DFE with PDR OU s/p PRP -- [facility] likely from straining and retching, otherwise stable eye exam -- Elevated IOP - hx of normal tension glaucoma - see plan below  PDR OU -- -- OS: s/p PPV with Endolaser 07/29/2014. Stable with improved VA. -- OD: Stable after moderate PRP (02/15/2013) with 1217 spots. Seems quiescent with regressed NVD OD. Space to fill-in ST and all around as needed. -- OCT OU: Flat retina with loss of foveal contour OU and ERM OD.  -- Last AIC is 12.0 on  11/18/14 -- Discussed tight glucose control, tight blood pressure control, and favorable levels of cholesterol for retinal as well as overall health.   PCIOL OU: -- Good position   Hx of normal tension glaucoma OU: -- Recommended starting cosopt on prior glaucoma evaluation but patient never started -- Taking coreg so will start him on Latanoprost for now -- Never got HVF due to cataracts but now he had cataract sx, so he could get HVF on the next visit in Glaucoma CAP -- Start Latanoprost OU -- RTC to Glaucoma CAP on Monday for formal evaluation       Past Medical History:  Diagnosis Date  . BPH (benign prostatic hyperplasia)   . CAD (coronary artery disease) of artery bypass graft    a. s/p multipe PCI to LCx and OMI; Last cath in 07/2014 w/ POBA to 70% stenosis in LCx   . Chronic diastolic CHF (congestive heart failure) (HCC)    a. EF 55-60% by echo in 08/2015; Grade 2 DD noted.  . Depression   . Diabetes mellitus without complication (HCC)   . Dialysis patient [facility])    a. Tue/Th/Sat Schedule  . High cholesterol   . Hypertension   . Renal disorder   . Thyroid disease     Patient Active Problem List   Diagnosis Date Noted  . Adjustment disorder with mixed anxiety and depressed mood 12/29/2016  . Dysthymia 12/29/2016  . Weakness 12/28/2016  . Hyperkalemia 04/02/2016  . CAD  in native artery 10/22/2015  . Chronic diastolic heart failure (HCC) 10/22/2015  . Syncope 10/22/2015  . ESRD (end stage renal disease) on dialysis (HCC) 10/22/2015  . CAP (community acquired pneumonia) 10/22/2015  . Aspiration pneumonia of right lung (HCC)   . Hyperglycemia   . Syncope and collapse   . Hypoxia 10/21/2015    Past Surgical History:  Procedure Laterality Date  . CARDIAC CATHETERIZATION    . DIALYSIS FISTULA CREATION      Prior to Admission medications   Medication Sig Start Date End Date Taking? Authorizing Provider  hydrALAZINE (APRESOLINE) 50 MG tablet Take 50 mg by mouth  3 (three) times daily.   Yes [provider]  isosorbide dinitrate (ISORDIL) 30 MG tablet Take 60 mg by mouth daily.   Yes [provider]  lidocaine (LIDODERM) 5 % Place 1 patch onto the skin daily. Remove & Discard patch within 12 hours or as directed by MD   Yes [provider]  sevelamer carbonate (RENVELA) 800 MG tablet Take 800 mg by mouth every 8 (eight) hours.   Yes [provider]  amLODipine (NORVASC) 10 MG tablet Take 10 mg by mouth daily.    [provider]  aspirin EC 81 MG tablet Take 81 mg by mouth daily.    [provider]  atorvastatin (LIPITOR) 80 MG tablet Take 80 mg by mouth at bedtime.    [provider]  calcitRIOL (ROCALTROL) 0.5 MCG capsule Take 1 mcg by mouth 3 (three) times a week. Take 1 mcg by mouth 3 (three) times a week with dialysis. 10/04/16 10/04/17  [provider]  calcium acetate (PHOSLO) 667 MG capsule Take 667 mg by mouth 3 (three) times daily with meals.     [provider]  carbamazepine (CARBATROL) 100 MG 12 hr capsule Take 300 mg by mouth 2 (two) times daily.  12/17/16 12/17/17  [provider]  carvedilol (COREG) 25 MG tablet Take 25 mg by mouth 2 (two) times daily.    [provider]  cloNIDine (CATAPRES) 0.2 MG tablet Take 0.2 mg by mouth 2 (two) times daily.     [provider]  clopidogrel (PLAVIX) 75 MG tablet Take 75 mg by mouth at bedtime.    [provider]  doxazosin (CARDURA) 1 MG tablet Take 1 mg by mouth at bedtime.    [provider]  FLUoxetine (PROZAC) 40 MG capsule Take 40 mg by mouth daily.    [provider]  gabapentin (NEURONTIN) 100 MG capsule Take 300 mg by mouth at bedtime.     [provider]  hydrALAZINE (APRESOLINE) 100 MG tablet Take 1 tablet (100 mg total) by mouth 3 (three) times daily. Patient not taking: Reported on 03/17/2017 04/17/16   Adrian Saran, MD  HYDROcodone-acetaminophen  (NORCO/VICODIN) 5-325 MG tablet Take 1 tablet by mouth every 6 (six) hours as needed for moderate pain. Patient not taking: Reported on 03/17/2017 12/30/16   Altamese Dilling, MD  insulin aspart protamine- aspart (NOVOLOG MIX 70/30) (70-30) 100 UNIT/ML injection Inject 0.05 mLs (5 Units total) into the skin daily with breakfast. Patient not taking: Reported on 12/28/2016 10/24/15   Hower, Cletis Athens, MD  insulin aspart protamine- aspart (NOVOLOG MIX 70/30) (70-30) 100 UNIT/ML injection Inject 0.1 mLs (10 Units total) into the skin daily with supper. Patient not taking: Reported on 12/28/2016 10/24/15   Hower, Cletis Athens, MD  insulin NPH Human (HUMULIN N,NOVOLIN N) 100 UNIT/ML injection Inject 3 Units into the  skin 2 (two) times daily. 5 units in the morning and 10 units at night. 09/21/16   [provider]  irbesartan (AVAPRO) 300 MG tablet Take 1 tablet (300 mg total) by mouth daily after supper. Patient not taking: Reported on 12/28/2016 04/17/16   Adrian SaranMody, Sital, MD  latanoprost (XALATAN) 0.005 % ophthalmic solution Place 1 drop into both eyes daily. 03/17/17 03/17/18  Merrily Brittleifenbark, Brax Walen, MD  levothyroxine (SYNTHROID, LEVOTHROID) 100 MCG tablet Take 100 mcg by mouth daily before breakfast.    [provider]  nitroGLYCERIN (NITROSTAT) 0.4 MG SL tablet Take 0.4 mg by mouth every 5 (five) minutes as needed. 10/24/12   [provider]  omeprazole (PRILOSEC) 40 MG capsule Take 40 mg by mouth daily.    [provider]  traZODone (DESYREL) 50 MG tablet Take 25 mg by mouth at bedtime.    [provider]    Allergies Eicosapentaenoic acid (epa) and Fish-derived products  No family history on file.  Social History Social History  Substance Use Topics  . Smoking status: Former Games developermoker  . Smokeless tobacco: Never Used  . Alcohol use No    Review of Systems Constitutional: No fever/chills Eyes: Positive visual changes. ENT: No sore throat. Cardiovascular: Positive  chest pain. Respiratory: Denies shortness of breath. Gastrointestinal: No abdominal pain.  No nausea, no vomiting.  No diarrhea.  No constipation. Genitourinary: Negative for dysuria. Musculoskeletal: Negative for back pain. Skin: Negative for rash. Neurological: Negative for headaches, focal weakness or numbness.   ____________________________________________   PHYSICAL EXAM:  VITAL SIGNS: ED Triage Vitals  Enc Vitals Group     BP --      Pulse --      Resp 03/17/17 0917 17     Temp 03/17/17 0917 97.6 F (36.4 C)     Temp Source 03/17/17 0917 Oral     SpO2 --      Weight 03/17/17 0918 150 lb (68 kg)     Height 03/17/17 0918 5\' 4"  (1.626 m)     Head Circumference --      Peak Flow --      Pain Score 03/17/17 0917 10     Pain Loc --      Pain Edu? --      Excl. in GC? --     Constitutional: Alert and oriented 4 chronically ill-appearing but in no acute distress Eyes: Right eye with significant subconjunctival hemorrhage and bloody chemosis pupils equal round and reactive to light bilaterally Head: Atraumatic. Nose: No congestion/rhinnorhea. Mouth/Throat: No trismus Neck: No stridor.   Cardiovascular: Normal rate, regular rhythm. Grossly normal heart sounds.  Good peripheral circulation. Respiratory: Normal respiratory effort.  No retractions. Faint crackles at bases Gastrointestinal: Soft nontender Musculoskeletal: 1+ pitting edema to mid shin bilaterally legs equal in size   Fistula to left upper extremity accessed Neurologic:  Normal speech and language. No gross focal neurologic deficits are appreciated. Skin:  No rash noted. Bronze skin consistent with end-stage renal disease Psychiatric: Mood and affect are normal. Speech and behavior are normal.    ____________________________________________   DIFFERENTIAL includes but not limited to  Acute coronary syndrome, pulmonary edema, metabolic derangement, some conjunctival hemorrhage,  glaucoma ____________________________________________   LABS (all labs ordered are listed, but only abnormal results are displayed)  Labs Reviewed  BASIC METABOLIC PANEL - Abnormal; Notable for the following:       Result Value   Chloride 98 (*)    Glucose, Bld 112 (*)  Creatinine, Ser 2.90 (*)    GFR calc non Af Amer 23 (*)    GFR calc Af Amer 27 (*)    All other components within normal limits  CBC - Abnormal; Notable for the following:    RBC 3.51 (*)    Hemoglobin 11.8 (*)    HCT 35.1 (*)    RDW 18.8 (*)    Platelets 147 (*)    All other components within normal limits  ACETAMINOPHEN LEVEL - Abnormal; Notable for the following:    Acetaminophen (Tylenol), Serum <10 (*)    All other components within normal limits  TROPONIN I  ETHANOL  SALICYLATE LEVEL  URINE DRUG SCREEN, QUALITATIVE ([facility] ONLY)    No signs of acute ischemia __________________________________________  EKG  ED ECG REPORT I, Merrily Brittle, the attending physician, personally viewed and interpreted this ECG.  Date: 03/17/2017 EKG Time:  Rate: 74 Rhythm: normal sinus rhythm QRS Axis: normal Intervals: Prolonged QTC ST/T Wave abnormalities: normal Narrative Interpretation: LVH prolonged QTC abnormal EKG with nonischemic  ____________________________________________  RADIOLOGY  Chest x-ray with some fluid overload ____________________________________________   PROCEDURES  Procedure(s) performed: no  Procedures  Critical Care performed: no  Observation: no ____________________________________________   INITIAL IMPRESSION / ASSESSMENT AND PLAN / ED COURSE  Pertinent labs & imaging results that were available during my care of the patient were reviewed by me and considered in my medical decision making (see chart for details).  On arrival the patient is well-appearing and asking to go home immediately. He said he did not want to come to the emergency department. We'll he does have  a significant cardiac history is chest pain today is atypical. He currently has no headache and no eye pain. Labs are pending.     ----------------------------------------- 12:38 PM on 03/17/2017 -----------------------------------------  After discharge and before he was able to leave the hospital the patient began having severe aching pain in his right eye unlike pain she was having earlier today. His wife arrived at bedside and she said he has had multiple intermittent episodes similar to this recently. They were unable to fill his prescription for glaucoma medication yesterday. I will check the pressure in his eyes now as well as given opioid pain medication. ____________________________________________   ----------------------------------------- 1:35 PM on 03/17/2017 -----------------------------------------  The patient's intraocular pressures 28 on the right and 26 on the left which is consistent with yesterday. His pain is adequately controlled. On further discussion with the wife and daughter and patient they thought that the eyedrops he was given yesterday were just artificial tears so they did not pick them up. They did not understand that he was to take latanoprost for his glaucoma.  I will provide them a written prescription now.  FINAL CLINICAL IMPRESSION(S) / ED DIAGNOSES  Final diagnoses:  Atypical chest pain  Glaucoma of right eye, unspecified glaucoma type  Subconjunctival bleed, right      NEW MEDICATIONS STARTED DURING THIS VISIT:  New Prescriptions   LATANOPROST (XALATAN) 0.005 % OPHTHALMIC SOLUTION    Place 1 drop into both eyes daily.     Note:  This document was prepared using Dragon voice recognition software and may include unintentional dictation errors.     Merrily Brittle, MD 03/17/17 1415

## 2017-03-17 NOTE — ED Notes (Signed)
Per Ambrose MantleKwana (nurse at dialysis) pt only had an hour of dialysis left so he can go home today and then return to dialysis on Saturday - provider notified [facility] dialysis notified to come and deaccess pt

## 2017-03-17 NOTE — ED Notes (Signed)
Spoke with Dr. Lamont Snowballifenbark in regards to patients discharge plans. Patient remains with dialysis access. Patient is to be discharged back to dialysis to complete his treatment. Called dialysis center. No answer.  [phone].

## 2017-03-17 NOTE — ED Notes (Addendum)
When attempted to get pt dressed for discharge he began to vomit - Dr Rifenbark noLamont Snowballtified - he came to assess pt and VO for zofran 8mg  ODT given

## 2017-03-17 NOTE — ED Notes (Signed)
Per dialysis pt is deaccessed - Interpreter requested for discharge

## 2017-03-17 NOTE — ED Notes (Signed)
Per Dr Lamont Snowballifenbark pt is ready to leave without receiving eye gtts from pharmacy - pt is given rx for this medication

## 2017-03-17 NOTE — ED Notes (Signed)
Pharmacy emailed to send xalatan

## 2017-03-17 NOTE — ED Notes (Signed)
Pt waiting in room 5-10 min to see if vomiting subsides

## 2017-03-24 ENCOUNTER — Emergency Department
Admission: EM | Admit: 2017-03-24 | Discharge: 2017-03-24 | Disposition: A | Discharge status: Home / Self Care | Payer: Self-pay | Attending: Emergency Medicine | Admitting: Emergency Medicine

## 2017-03-24 DIAGNOSIS — R079 Chest pain, unspecified: Secondary | ICD-10-CM | POA: Insufficient documentation

## 2017-03-24 DIAGNOSIS — Z5321 Procedure and treatment not carried out due to patient leaving prior to being seen by health care provider: Secondary | ICD-10-CM | POA: Insufficient documentation

## 2017-03-24 NOTE — ED Notes (Addendum)
Interpreter Maryjane Hurtertto in triage room w/ this RN Pt reported to ED w/ c/o CP and HTN after dialysis today. Pt sts that he did full dialysis treatment. Pt stated that CP began after dialysis, L side CP that radiates to L arm and back and n/v. While this RN was asking questions and obtaining VS, pt became aggravated and stood up. Per Maryjane Hurtertto interpreter, pt stted that he was having emergency, that he didn't have time to answer "stupid" questions. This RN attempted to explain to pt that these were basic questions need to understand pts healthcare needs. Pts wife requested pt stay. Pt refused. Pt ambulatory w/ walker to ED lobby.

## 2017-04-26 ENCOUNTER — Emergency Department: Payer: Medicaid Other

## 2017-04-26 ENCOUNTER — Inpatient Hospital Stay
Admission: EM | Admit: 2017-04-26 | Discharge: 2017-04-27 | DRG: 291 | Disposition: A | Discharge status: Home / Self Care | Payer: Medicaid Other | Attending: Internal Medicine | Admitting: Internal Medicine

## 2017-04-26 ENCOUNTER — Encounter: Payer: Self-pay | Admitting: *Deleted

## 2017-04-26 DIAGNOSIS — Z794 Long term (current) use of insulin: Secondary | ICD-10-CM | POA: Diagnosis not present

## 2017-04-26 DIAGNOSIS — J811 Chronic pulmonary edema: Secondary | ICD-10-CM | POA: Diagnosis present

## 2017-04-26 DIAGNOSIS — N400 Enlarged prostate without lower urinary tract symptoms: Secondary | ICD-10-CM | POA: Diagnosis present

## 2017-04-26 DIAGNOSIS — E1122 Type 2 diabetes mellitus with diabetic chronic kidney disease: Secondary | ICD-10-CM | POA: Diagnosis present

## 2017-04-26 DIAGNOSIS — F329 Major depressive disorder, single episode, unspecified: Secondary | ICD-10-CM | POA: Diagnosis present

## 2017-04-26 DIAGNOSIS — E039 Hypothyroidism, unspecified: Secondary | ICD-10-CM | POA: Diagnosis present

## 2017-04-26 DIAGNOSIS — Z79899 Other long term (current) drug therapy: Secondary | ICD-10-CM

## 2017-04-26 DIAGNOSIS — N186 End stage renal disease: Secondary | ICD-10-CM | POA: Diagnosis present

## 2017-04-26 DIAGNOSIS — Z992 Dependence on renal dialysis: Secondary | ICD-10-CM

## 2017-04-26 DIAGNOSIS — E7800 Pure hypercholesterolemia, unspecified: Secondary | ICD-10-CM | POA: Diagnosis present

## 2017-04-26 DIAGNOSIS — E785 Hyperlipidemia, unspecified: Secondary | ICD-10-CM | POA: Diagnosis present

## 2017-04-26 DIAGNOSIS — Z7982 Long term (current) use of aspirin: Secondary | ICD-10-CM

## 2017-04-26 DIAGNOSIS — I5043 Acute on chronic combined systolic (congestive) and diastolic (congestive) heart failure: Secondary | ICD-10-CM | POA: Diagnosis present

## 2017-04-26 DIAGNOSIS — I161 Hypertensive emergency: Secondary | ICD-10-CM | POA: Diagnosis present

## 2017-04-26 DIAGNOSIS — R52 Pain, unspecified: Secondary | ICD-10-CM

## 2017-04-26 DIAGNOSIS — I5032 Chronic diastolic (congestive) heart failure: Secondary | ICD-10-CM

## 2017-04-26 DIAGNOSIS — Z87891 Personal history of nicotine dependence: Secondary | ICD-10-CM

## 2017-04-26 DIAGNOSIS — Z955 Presence of coronary angioplasty implant and graft: Secondary | ICD-10-CM | POA: Diagnosis not present

## 2017-04-26 DIAGNOSIS — N2581 Secondary hyperparathyroidism of renal origin: Secondary | ICD-10-CM | POA: Diagnosis present

## 2017-04-26 DIAGNOSIS — I25810 Atherosclerosis of coronary artery bypass graft(s) without angina pectoris: Secondary | ICD-10-CM | POA: Diagnosis present

## 2017-04-26 DIAGNOSIS — Z91048 Other nonmedicinal substance allergy status: Secondary | ICD-10-CM

## 2017-04-26 DIAGNOSIS — I5023 Acute on chronic systolic (congestive) heart failure: Secondary | ICD-10-CM

## 2017-04-26 DIAGNOSIS — I132 Hypertensive heart and chronic kidney disease with heart failure and with stage 5 chronic kidney disease, or end stage renal disease: Secondary | ICD-10-CM | POA: Diagnosis present

## 2017-04-26 DIAGNOSIS — J810 Acute pulmonary edema: Secondary | ICD-10-CM

## 2017-04-26 DIAGNOSIS — Z91018 Allergy to other foods: Secondary | ICD-10-CM

## 2017-04-26 DIAGNOSIS — D631 Anemia in chronic kidney disease: Secondary | ICD-10-CM | POA: Diagnosis present

## 2017-04-26 DIAGNOSIS — Z79891 Long term (current) use of opiate analgesic: Secondary | ICD-10-CM

## 2017-04-26 DIAGNOSIS — Z9981 Dependence on supplemental oxygen: Secondary | ICD-10-CM

## 2017-04-26 DIAGNOSIS — Z7902 Long term (current) use of antithrombotics/antiplatelets: Secondary | ICD-10-CM

## 2017-04-26 DIAGNOSIS — Z79890 Hormone replacement therapy (postmenopausal): Secondary | ICD-10-CM

## 2017-04-26 DIAGNOSIS — J9601 Acute respiratory failure with hypoxia: Secondary | ICD-10-CM | POA: Diagnosis present

## 2017-04-26 HISTORY — DX: Dependence on other enabling machines and devices: Z99.89

## 2017-04-26 LAB — CBC WITH DIFFERENTIAL/PLATELET
BASOS ABS: 0 10*3/uL (ref 0–0.1)
Basophils Relative: 1 %
Eosinophils Absolute: 1.4 10*3/uL — ABNORMAL HIGH (ref 0–0.7)
Eosinophils Relative: 19 %
HEMATOCRIT: 35.2 % — AB (ref 40.0–52.0)
Hemoglobin: 12.3 g/dL — ABNORMAL LOW (ref 13.0–18.0)
LYMPHS PCT: 8 %
Lymphs Abs: 0.6 10*3/uL — ABNORMAL LOW (ref 1.0–3.6)
MCH: 34.2 pg — ABNORMAL HIGH (ref 26.0–34.0)
MCHC: 34.9 g/dL (ref 32.0–36.0)
MCV: 98.1 fL (ref 80.0–100.0)
Monocytes Absolute: 0.5 10*3/uL (ref 0.2–1.0)
Monocytes Relative: 7 %
NEUTROS ABS: 5 10*3/uL (ref 1.4–6.5)
Neutrophils Relative %: 65 %
PLATELETS: 104 10*3/uL — AB (ref 150–440)
RBC: 3.59 MIL/uL — AB (ref 4.40–5.90)
RDW: 16.7 % — ABNORMAL HIGH (ref 11.5–14.5)
WBC: 7.5 10*3/uL (ref 3.8–10.6)

## 2017-04-26 LAB — COMPREHENSIVE METABOLIC PANEL
ALBUMIN: 4 g/dL (ref 3.5–5.0)
ALT: 13 U/L — AB (ref 17–63)
AST: 23 U/L (ref 15–41)
Alkaline Phosphatase: 130 U/L — ABNORMAL HIGH (ref 38–126)
Anion gap: 11 (ref 5–15)
BILIRUBIN TOTAL: 0.7 mg/dL (ref 0.3–1.2)
BUN: 18 mg/dL (ref 6–20)
CHLORIDE: 96 mmol/L — AB (ref 101–111)
CO2: 33 mmol/L — ABNORMAL HIGH (ref 22–32)
CREATININE: 2.89 mg/dL — AB (ref 0.61–1.24)
Calcium: 8.7 mg/dL — ABNORMAL LOW (ref 8.9–10.3)
GFR calc Af Amer: 27 mL/min — ABNORMAL LOW (ref >60)
GFR, EST NON AFRICAN AMERICAN: 23 mL/min — AB (ref >60)
GLUCOSE: 192 mg/dL — AB (ref 65–99)
Potassium: 3.6 mmol/L (ref 3.5–5.1)
Sodium: 140 mmol/L (ref 135–145)
Total Protein: 8.1 g/dL (ref 6.5–8.1)

## 2017-04-26 LAB — SEDIMENTATION RATE: Sed Rate: 40 mm/hr — ABNORMAL HIGH (ref 0–20)

## 2017-04-26 LAB — CK: Total CK: 355 U/L (ref 49–397)

## 2017-04-26 LAB — TROPONIN I: Troponin I: 0.03 ng/mL (ref <0.03)

## 2017-04-26 LAB — HEMOGLOBIN A1C
Hgb A1c MFr Bld: 6.7 % — ABNORMAL HIGH (ref 4.8–5.6)
Mean Plasma Glucose: 145.59 mg/dL

## 2017-04-26 LAB — MAGNESIUM: Magnesium: 2.2 mg/dL (ref 1.7–2.4)

## 2017-04-26 LAB — GLUCOSE, CAPILLARY: GLUCOSE-CAPILLARY: 140 mg/dL — AB (ref 65–99)

## 2017-04-26 MED ORDER — OXYCODONE HCL 5 MG PO TABS
5.0000 mg | ORAL_TABLET | ORAL | Status: DC | PRN
  Administered 2017-04-26: 5 mg via ORAL
  Filled 2017-04-26: qty 1

## 2017-04-26 MED ORDER — ACETAMINOPHEN 325 MG PO TABS: 650.0000 mg | ORAL_TABLET | Freq: Four times a day (QID) | ORAL | Status: DC | PRN

## 2017-04-26 MED ORDER — INSULIN ASPART 100 UNIT/ML ~~LOC~~ SOLN: 0.0000 [IU] | Freq: Every day | SUBCUTANEOUS | Status: DC

## 2017-04-26 MED ORDER — CLONIDINE HCL 0.1 MG PO TABS
0.2000 mg | ORAL_TABLET | Freq: Two times a day (BID) | ORAL | Status: DC
  Administered 2017-04-26 – 2017-04-27 (×2): 0.2 mg via ORAL
  Filled 2017-04-26 (×2): qty 2

## 2017-04-26 MED ORDER — CARBAMAZEPINE ER 100 MG PO TB12
300.0000 mg | ORAL_TABLET | Freq: Two times a day (BID) | ORAL | Status: DC
  Administered 2017-04-26 – 2017-04-27 (×2): 300 mg via ORAL
  Filled 2017-04-26 (×3): qty 3

## 2017-04-26 MED ORDER — DOXAZOSIN MESYLATE 1 MG PO TABS
1.0000 mg | ORAL_TABLET | Freq: Every day | ORAL | Status: DC
  Administered 2017-04-26: 1 mg via ORAL
  Filled 2017-04-26 (×2): qty 1

## 2017-04-26 MED ORDER — ONDANSETRON HCL 4 MG/2ML IJ SOLN: 4.0000 mg | Freq: Four times a day (QID) | INTRAMUSCULAR | Status: DC | PRN

## 2017-04-26 MED ORDER — LEVOTHYROXINE SODIUM 100 MCG PO TABS
100.0000 ug | ORAL_TABLET | Freq: Every day | ORAL | Status: DC
  Administered 2017-04-27: 100 ug via ORAL
  Filled 2017-04-26: qty 1

## 2017-04-26 MED ORDER — ASPIRIN EC 81 MG PO TBEC
81.0000 mg | DELAYED_RELEASE_TABLET | Freq: Every day | ORAL | Status: DC
  Administered 2017-04-27: 81 mg via ORAL
  Filled 2017-04-26: qty 1

## 2017-04-26 MED ORDER — CALCITRIOL 0.5 MCG PO CAPS
1.0000 ug | ORAL_CAPSULE | ORAL | Status: DC
  Administered 2017-04-27: 1 ug via ORAL
  Filled 2017-04-26: qty 2

## 2017-04-26 MED ORDER — CLONIDINE HCL 0.1 MG PO TABS
0.2000 mg | ORAL_TABLET | Freq: Once | ORAL | Status: AC
  Administered 2017-04-26: 0.2 mg via ORAL
  Filled 2017-04-26: qty 2

## 2017-04-26 MED ORDER — SEVELAMER CARBONATE 800 MG PO TABS
800.0000 mg | ORAL_TABLET | Freq: Three times a day (TID) | ORAL | Status: DC
  Administered 2017-04-27 (×2): 800 mg via ORAL
  Filled 2017-04-26 (×2): qty 1

## 2017-04-26 MED ORDER — INSULIN DETEMIR 100 UNIT/ML ~~LOC~~ SOLN
5.0000 [IU] | Freq: Two times a day (BID) | SUBCUTANEOUS | Status: DC
  Administered 2017-04-26 – 2017-04-27 (×2): 5 [IU] via SUBCUTANEOUS
  Filled 2017-04-26 (×3): qty 0.05

## 2017-04-26 MED ORDER — CARVEDILOL 25 MG PO TABS
25.0000 mg | ORAL_TABLET | Freq: Two times a day (BID) | ORAL | Status: DC
  Administered 2017-04-26 – 2017-04-27 (×2): 25 mg via ORAL
  Filled 2017-04-26 (×2): qty 1

## 2017-04-26 MED ORDER — NITROGLYCERIN 0.4 MG SL SUBL: 0.4000 mg | SUBLINGUAL_TABLET | SUBLINGUAL | Status: DC | PRN

## 2017-04-26 MED ORDER — ONDANSETRON HCL 4 MG PO TABS: 4.0000 mg | ORAL_TABLET | Freq: Four times a day (QID) | ORAL | Status: DC | PRN

## 2017-04-26 MED ORDER — HYDRALAZINE HCL 50 MG PO TABS
50.0000 mg | ORAL_TABLET | Freq: Three times a day (TID) | ORAL | Status: DC
  Administered 2017-04-26 – 2017-04-27 (×2): 50 mg via ORAL
  Filled 2017-04-26 (×2): qty 1

## 2017-04-26 MED ORDER — ACETAMINOPHEN 650 MG RE SUPP: 650.0000 mg | Freq: Four times a day (QID) | RECTAL | Status: DC | PRN

## 2017-04-26 MED ORDER — CALCIUM ACETATE 667 MG PO CAPS
667.0000 mg | ORAL_CAPSULE | Freq: Three times a day (TID) | ORAL | Status: DC
  Administered 2017-04-27 (×2): 667 mg via ORAL
  Filled 2017-04-26 (×5): qty 1

## 2017-04-26 MED ORDER — FLUOXETINE HCL 20 MG PO CAPS
40.0000 mg | ORAL_CAPSULE | Freq: Every day | ORAL | Status: DC
  Administered 2017-04-27: 40 mg via ORAL
  Filled 2017-04-26 (×2): qty 2

## 2017-04-26 MED ORDER — TRAZODONE HCL 50 MG PO TABS
25.0000 mg | ORAL_TABLET | Freq: Every day | ORAL | Status: DC
  Administered 2017-04-26: 25 mg via ORAL
  Filled 2017-04-26: qty 1

## 2017-04-26 MED ORDER — HYDROMORPHONE HCL 1 MG/ML IJ SOLN
0.5000 mg | Freq: Once | INTRAMUSCULAR | Status: AC
  Administered 2017-04-26: 0.5 mg via INTRAVENOUS
  Filled 2017-04-26: qty 1

## 2017-04-26 MED ORDER — HYDRALAZINE HCL 20 MG/ML IJ SOLN: 10.0000 mg | Freq: Four times a day (QID) | INTRAMUSCULAR | Status: DC | PRN

## 2017-04-26 MED ORDER — ATORVASTATIN CALCIUM 20 MG PO TABS
80.0000 mg | ORAL_TABLET | Freq: Every day | ORAL | Status: DC
  Administered 2017-04-26: 80 mg via ORAL
  Filled 2017-04-26: qty 4

## 2017-04-26 MED ORDER — GABAPENTIN 300 MG PO CAPS
300.0000 mg | ORAL_CAPSULE | Freq: Every day | ORAL | Status: DC
  Administered 2017-04-26: 300 mg via ORAL
  Filled 2017-04-26: qty 1

## 2017-04-26 MED ORDER — HEPARIN SODIUM (PORCINE) 5000 UNIT/ML IJ SOLN: 5000.0000 [IU] | Freq: Three times a day (TID) | INTRAMUSCULAR | Status: DC

## 2017-04-26 MED ORDER — ONDANSETRON HCL 4 MG/2ML IJ SOLN
4.0000 mg | Freq: Once | INTRAMUSCULAR | Status: AC
  Administered 2017-04-26: 4 mg via INTRAVENOUS
  Filled 2017-04-26: qty 2

## 2017-04-26 MED ORDER — LATANOPROST 0.005 % OP SOLN
1.0000 [drp] | Freq: Every day | OPHTHALMIC | Status: DC
  Administered 2017-04-26 – 2017-04-27 (×2): 1 [drp] via OPHTHALMIC
  Filled 2017-04-26: qty 2.5

## 2017-04-26 MED ORDER — PANTOPRAZOLE SODIUM 40 MG PO TBEC
40.0000 mg | DELAYED_RELEASE_TABLET | Freq: Every day | ORAL | Status: DC
  Administered 2017-04-27: 40 mg via ORAL
  Filled 2017-04-26: qty 1

## 2017-04-26 MED ORDER — AMLODIPINE BESYLATE 10 MG PO TABS
10.0000 mg | ORAL_TABLET | Freq: Every day | ORAL | Status: DC
  Administered 2017-04-27: 10 mg via ORAL
  Filled 2017-04-26: qty 1

## 2017-04-26 MED ORDER — CLOPIDOGREL BISULFATE 75 MG PO TABS
75.0000 mg | ORAL_TABLET | Freq: Every day | ORAL | Status: DC
  Administered 2017-04-26: 75 mg via ORAL
  Filled 2017-04-26: qty 1

## 2017-04-26 MED ORDER — INSULIN ASPART 100 UNIT/ML ~~LOC~~ SOLN: 0.0000 [IU] | Freq: Three times a day (TID) | SUBCUTANEOUS | Status: DC

## 2017-04-26 MED ORDER — POLYETHYLENE GLYCOL 3350 17 G PO PACK: 17.0000 g | PACK | Freq: Every day | ORAL | Status: DC | PRN

## 2017-04-26 MED ORDER — SODIUM CHLORIDE 0.9% FLUSH
3.0000 mL | Freq: Two times a day (BID) | INTRAVENOUS | Status: DC
  Administered 2017-04-27: 3 mL via INTRAVENOUS

## 2017-04-26 MED ORDER — ALBUTEROL SULFATE (2.5 MG/3ML) 0.083% IN NEBU: 2.5000 mg | INHALATION_SOLUTION | RESPIRATORY_TRACT | Status: DC | PRN

## 2017-04-26 MED ORDER — ISOSORBIDE DINITRATE 30 MG PO TABS
60.0000 mg | ORAL_TABLET | Freq: Every day | ORAL | Status: DC
  Administered 2017-04-27: 60 mg via ORAL
  Filled 2017-04-26 (×2): qty 2

## 2017-04-26 NOTE — ED Notes (Signed)
Patient here post dialysis treatment c/o pain "all over".

## 2017-04-26 NOTE — ED Notes (Signed)
Pt signed consent form for dialysis.

## 2017-04-26 NOTE — ED Notes (Signed)
Report given to [facility]. Pt is in Dialysis at the moment, will take him to the floor as soon as he returns.

## 2017-04-26 NOTE — ED Provider Notes (Signed)
[facility] Emergency Department Provider Note   ____________________________________________   First MD Initiated Contact with Patient 04/26/17 1238     (approximate)  I have reviewed the triage vital signs and the nursing notes.   HISTORY  Chief Complaint Weakness    HPI Marcus [name] is a 51 y.o. male who comes in complaining of pain after dialysis. comes in complaining of pain after dialysis. Patient reports usually he can tolerate 3 hours of dialysis and after that he gets a lot more pain. His whole body hurts everything including his feet especially his feet. Been going on for at least the last 2 weeks probably before. His blood pressure today has been elevated remained elevated.   Past Medical History:  Diagnosis Date  . BPH (benign prostatic hyperplasia)   . CAD (coronary artery disease) of artery bypass graft    a. s/p multipe PCI to LCx and OMI; Last cath in 07/2014 w/ POBA to 70% stenosis in LCx   . Chronic diastolic CHF (congestive heart failure) (HCC)    a. EF 55-60% by echo in 08/2015; Grade 2 DD noted.  . CPAP (continuous positive airway pressure) dependence   . Depression   . Diabetes mellitus without complication (HCC)   . Dialysis patient [facility])    a. Tue/Th/Sat Schedule  . High cholesterol   . Hypertension   . Renal disorder   . Thyroid disease     Patient Active Problem List   Diagnosis Date Noted  . Adjustment disorder with mixed anxiety and depressed mood 12/29/2016  . Dysthymia 12/29/2016  . Weakness 12/28/2016  . Hyperkalemia 04/02/2016  . CAD in native artery 10/22/2015  . Chronic diastolic heart failure (HCC) 10/22/2015  . Syncope 10/22/2015  . ESRD (end stage renal disease) on dialysis (HCC) 10/22/2015  . CAP (community acquired pneumonia) 10/22/2015  . Aspiration pneumonia of right lung (HCC)   . Hyperglycemia   . Syncope and collapse   . Hypoxia 10/21/2015    Past Surgical History:  Procedure Laterality  Date  . CARDIAC CATHETERIZATION    . DIALYSIS FISTULA CREATION      Prior to Admission medications   Medication Sig Start Date End Date Taking? Authorizing Provider  amLODipine (NORVASC) 10 MG tablet Take 10 mg by mouth daily.    [provider]  aspirin EC 81 MG tablet Take 81 mg by mouth daily.    [provider]  atorvastatin (LIPITOR) 80 MG tablet Take 80 mg by mouth at bedtime.    [provider]  calcitRIOL (ROCALTROL) 0.5 MCG capsule Take 1 mcg by mouth 3 (three) times a week. Take 1 mcg by mouth 3 (three) times a week with dialysis. 10/04/16 10/04/17  [provider]  calcium acetate (PHOSLO) 667 MG capsule Take 667 mg by mouth 3 (three) times daily with meals.     [provider]  carbamazepine (CARBATROL) 100 MG 12 hr capsule Take 300 mg by mouth 2 (two) times daily.  12/17/16 12/17/17  [provider]  carvedilol (COREG) 25 MG tablet Take 25 mg by mouth 2 (two) times daily.    [provider]  cloNIDine (CATAPRES) 0.2 MG tablet Take 0.2 mg by mouth 2 (two) times daily.     [provider]  clopidogrel (PLAVIX) 75 MG tablet Take 75 mg by mouth at bedtime.    [provider]  doxazosin (CARDURA) 1 MG tablet Take 1 mg by mouth at bedtime.    [provider]  FLUoxetine (PROZAC) 40 MG capsule Take 40 mg by mouth daily.    [provider]  gabapentin (NEURONTIN) 100 MG capsule Take 300 mg by mouth at bedtime.     [provider]  hydrALAZINE (APRESOLINE) 100 MG tablet Take 1 tablet (100 mg total) by mouth 3 (three) times daily. Patient not taking: Reported on 03/17/2017 04/17/16   Adrian Saran, MD  hydrALAZINE (APRESOLINE) 50 MG tablet Take 50 mg by mouth 3 (three) times daily.    [provider]  HYDROcodone-acetaminophen (NORCO/VICODIN) 5-325 MG tablet Take 1 tablet by mouth every 6 (six) hours as needed for moderate pain. Patient not taking: Reported on 03/17/2017 12/30/16    Altamese Dilling, MD  insulin aspart protamine- aspart (NOVOLOG MIX 70/30) (70-30) 100 UNIT/ML injection Inject 0.05 mLs (5 Units total) into the skin daily with breakfast. Patient not taking: Reported on 12/28/2016 10/24/15   Hower, Cletis [place], MD  insulin aspart protamine- aspart (NOVOLOG MIX 70/30) (70-30) 100 UNIT/ML injection Inject 0.1 mLs (10 Units total) into the skin daily with supper. Patient not taking: Reported on 12/28/2016 10/24/15   Hower, Cletis [place], MD  insulin NPH Human (HUMULIN N,NOVOLIN N) 100 UNIT/ML injection Inject 3 Units into the skin 2 (two) times daily. 5 units in the morning and 10 units at night. 09/21/16   [provider]  irbesartan (AVAPRO) 300 MG tablet Take 1 tablet (300 mg total) by mouth daily after supper. Patient not taking: Reported on 12/28/2016 04/17/16   Adrian Saran, MD  isosorbide dinitrate (ISORDIL) 30 MG tablet Take 60 mg by mouth daily.    [provider]  latanoprost (XALATAN) 0.005 % ophthalmic solution Place 1 drop into both eyes daily. 03/17/17 03/17/18  Merrily Brittle, MD  levothyroxine (SYNTHROID, LEVOTHROID) 100 MCG tablet Take 100 mcg by mouth daily before breakfast.    [provider]  lidocaine (LIDODERM) 5 % Place 1 patch onto the skin daily. Remove & Discard patch within 12 hours or as directed by MD    [provider]  nitroGLYCERIN (NITROSTAT) 0.4 MG SL tablet Take 0.4 mg by mouth every 5 (five) minutes as needed. 10/24/12   [provider]  omeprazole (PRILOSEC) 40 MG capsule Take 40 mg by mouth daily.    [provider]  sevelamer carbonate (RENVELA) 800 MG tablet Take 800 mg by mouth every 8 (eight) hours.    [provider]  traZODone (DESYREL) 50 MG tablet Take 25 mg by mouth at bedtime.    [provider]    Allergies Eicosapentaenoic acid (epa) and Fish-derived products  History reviewed. No pertinent family history.  Social History Social History  Substance Use  Topics  . Smoking status: Former Games developer  . Smokeless tobacco: Never Used  . Alcohol use No    Review of Systems  Constitutional: No fever/chills Eyes: No visual changes. ENT: No sore throat. Cardiovascular: Patient says everything hurts including his chest Respiratory: Denies shortness of breath. Gastrointestinal: Patient says everything hurts including his chest Genitourinary: Negative for dysuria. Musculoskeletalback pain. Skin: Negative for rash. Neurological: Negative for headaches, focal weakness  ____________________________________________   PHYSICAL EXAM:  VITAL SIGNS: ED Triage Vitals  Enc Vitals Group     BP 04/26/17 1151 (!) 210/70     Pulse Rate 04/26/17 1151 80     Resp 04/26/17 1151 16     Temp 04/26/17 1151 98.3 F (36.8 C)     Temp Source 04/26/17 1151 Oral  SpO2 04/26/17 1151 93 %     Weight 04/26/17 1200 160 lb (72.6 kg)     Height 04/26/17 1200 5' (1.524 m)     Head Circumference --      Peak Flow --      Pain Score 04/26/17 1159 8     Pain Loc --      Pain Edu? --      Excl. in GC? --      Constitutional: Alert and oriented. Eyes: Conjunctivae are normal. P. Head: Atraumatic. Nose: No congestion/rhinnorhea. Mouth/Throat: Mucous membranes are moist.  Oropharynx non-erythematous. Neck: No stridor. Cardiovascular: Normal rate, regular rhythm. Grossly normal heart sounds.  Good peripheral circulation. Respiratory: Normal respiratory effort.  No retractions. Lungs CTAB. Gastrointestinal: Soft and nontender. No distention. No abdominal bruits. No CVA tenderness. }Musculoskeletal: No lower extremity tenderness nor edema.  No joint effusions. Neurologic:  Normal speech and language. No gross focal neurologic deficits are appreciated. Skin:  Skin is warm, dry and intact. No rash noted.   ____________________________________________   LABS (all labs ordered are listed, but only abnormal results are displayed)  Labs Reviewed  CBC WITH  DIFFERENTIAL/PLATELET - Abnormal; Notable for the following:       Result Value   RBC 3.59 (*)    Hemoglobin 12.3 (*)    HCT 35.2 (*)    MCH 34.2 (*)    RDW 16.7 (*)    Platelets 104 (*)    Lymphs Abs 0.6 (*)    Eosinophils Absolute 1.4 (*)    All other components within normal limits  COMPREHENSIVE METABOLIC PANEL - Abnormal; Notable for the following:    Chloride 96 (*)    CO2 33 (*)    Glucose, Bld 192 (*)    Creatinine, Ser 2.89 (*)    Calcium 8.7 (*)    ALT 13 (*)    Alkaline Phosphatase 130 (*)    GFR calc non Af Amer 23 (*)    GFR calc Af Amer 27 (*)    All other components within normal limits  MAGNESIUM  TROPONIN I  CK  SEDIMENTATION RATE   ____________________________________________  EKG  EKG read and interpreted by me shows sinus rhythm rate of 81 right axis slight ST segment depression in V5 and V6 ____________________________________________  RADIOLOGY  Dg Chest 2 View  Result Date: 04/26/2017 CLINICAL DATA:  Pain after dialysis. EXAM: CHEST  2 VIEW COMPARISON:  March 17, 2017 FINDINGS: Stable cardiomegaly. Streaky opacity in left retrocardiac region on the frontal view only is likely atelectasis. Increased interstitial markings suggest edema. There are probably tiny effusions. No nodules or masses. No acute infiltrates. IMPRESSION: Probable tiny effusions and edema. Streaky opacity in left retrocardiac region is likely atelectasis. Electronically Signed   By: Gerome Sam III M.D   On: 04/26/2017 16:06   US Venous Img Lower Bilateral  Result Date: 04/26/2017 CLINICAL DATA:  Bilateral leg pain for 2 weeks. Pain occurs while having dialysis. EXAM: BILATERAL LOWER EXTREMITY VENOUS DOPPLER ULTRASOUND TECHNIQUE: Gray-scale sonography with graded compression, as well as color Doppler and duplex ultrasound were performed to evaluate the lower extremity deep venous systems from the level of the common femoral vein and including the common femoral, femoral, profunda  femoral, popliteal and calf veins including the posterior tibial, peroneal and gastrocnemius veins when visible. The superficial great saphenous vein was also interrogated. Spectral Doppler was utilized to evaluate flow at rest and with distal augmentation maneuvers in the common femoral, femoral and popliteal  veins. COMPARISON:  None. FINDINGS: RIGHT LOWER EXTREMITY Common Femoral Vein: No evidence of thrombus. Normal compressibility, respiratory phasicity and response to augmentation. Saphenofemoral Junction: No evidence of thrombus. Normal compressibility and flow on color Doppler imaging. Profunda Femoral Vein: No evidence of thrombus. Normal compressibility and flow on color Doppler imaging. Femoral Vein: No evidence of acute thrombus. Normal compressibility, respiratory phasicity and response to augmentation. Small peripheral echogenic areas in the mid femoral vein are most compatible with calcifications and could be related to remote DVT. Popliteal Vein: No evidence of thrombus. Normal compressibility, respiratory phasicity and response to augmentation. Calf Veins: No evidence of thrombus. Normal compressibility and flow on color Doppler imaging. Other Findings: Elongated fluid collection in the right popliteal fossa measures 4.1 x 1.0 x 2.8 cm. No significant vascular flow within this cystic structure. Findings are compatible with a Baker's cyst. LEFT LOWER EXTREMITY Common Femoral Vein: No evidence of thrombus. Normal compressibility, respiratory phasicity and response to augmentation. Saphenofemoral Junction: No evidence of thrombus. Normal compressibility and flow on color Doppler imaging. Profunda Femoral Vein: No evidence of thrombus. Normal compressibility and flow on color Doppler imaging. Femoral Vein: No evidence of thrombus. Normal compressibility, respiratory phasicity and response to augmentation. Peripheral echoes in the mid left femoral vein probably represent calcifications. Popliteal Vein: No  evidence of thrombus. Normal compressibility, respiratory phasicity and response to augmentation. Calf Veins: No evidence of thrombus. Normal compressibility and flow on color Doppler imaging. Other Findings: Small cystic collection in the left popliteal fossa. Structure measures 2.2 x 0.9 x 2.8 cm. IMPRESSION: No evidence for an acute DVT in the lower extremities. Small peripheral calcifications and echogenic structures in the femoral veins bilaterally. 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 Bilateral Baker's cysts. Electronically Signed   By: Richarda Overlie M.D.   On: 04/26/2017 14:21   Chest x-ray was not read as CHF by radiology but Dr. Thedore Mins and I looked at it and it does look like CHF to [place]. Patient is coughing as well Dr. Thedore Mins will get him in the hospital and probably re-dialyze him. ____________________________________________   PROCEDURES  Procedure(s) performed:   Procedures  Critical Care performed:  ____________________________________________   INITIAL IMPRESSION / ASSESSMENT AND PLAN / ED COURSE  Pertinent labs & imaging results that were available during my care of the patient were reviewed by me and considered in my medical decision making (see chart for details).    Clinical Course as of Apr 26 1633  Tue Apr 26, 2017  1542 WBC: 7.5 [PM]    Clinical Course User Index [PM] Arnaldo Natal, MD     ____________________________________________   FINAL CLINICAL IMPRESSION(S) / ED DIAGNOSES  Final diagnoses:  Pain  Acute on chronic systolic congestive heart failure (HCC)      NEW MEDICATIONS STARTED DURING THIS VISIT:  New Prescriptions   No medications on file     Note:  This document was prepared using Dragon voice recognition software and may include unintentional dictation errors.    Arnaldo Natal, MD 04/26/17 [phone]

## 2017-04-26 NOTE — Progress Notes (Signed)
Post HD  

## 2017-04-26 NOTE — ED Triage Notes (Addendum)
Pt to ED from dialysis where he completed a four hour session. Pt reports full body pain and increased pain in both legs and feet. Pt reprots for the past 2 weeks he had started having bilateral leg pain at the 3rd hour of dialysis. Pts neurologist verbalized to pt that he needed to have an [place] to rule out DVT. Pt came to ED. Dilaysis performed blood testing at the Neurologists request. (CBC w/ differential, CMP, RPR, TSH, Vitamin B12, Myasthenia Gravis). No SOB reported. No swelling or weight gain noted. Pulse intact. Color appropriate.

## 2017-04-26 NOTE — Progress Notes (Signed)
[facility], [place] 04/26/17  Subjective:   Patient sent from dialysis for LE doppler to evaluate LE edema b/l Doppler is negative but patient has significantly high BP and is coughing clear sputum I contacted his HD center. They say that they are not sure about his compliance with BP meds He had treatment today but often c/o LE pain and cramping 3 hrs in to treatment States his EDW is 64 kg  Objective:  Vital signs in last 24 hours:  Temp:  [98.3 F (36.8 C)] 98.3 F (36.8 C) (08/21 1151) Pulse Rate:  [80-81] 81 (08/21 1530) Resp:  [16] 16 (08/21 1400) BP: (208-230)/(70-94) 230/94 (08/21 1530) SpO2:  [93 %-94 %] 94 % (08/21 1530) Weight:  [72.6 kg (160 lb)] 72.6 kg (160 lb) (08/21 1200)  Weight change:  Filed Weights   04/26/17 1200  Weight: 72.6 kg (160 lb)    Intake/Output:   No intake or output data in the 24 hours ending 04/26/17 1636   Physical Exam: General: Mild distress from illness  HEENT Injected conjunctiva  Neck Distended neck veins  Pulm/lungs B/l diffuse crackles  CVS/Heart Regular, tachycardic  Abdomen:  Soft, non tender  Extremities: + b/l edema  Neurologic: Alert, able to follow commands  Skin: No edema  Access Left forearm AVF       Basic Metabolic Panel:   Recent Labs Lab 04/26/17 1211  NA 140  K 3.6  CL 96*  CO2 33*  GLUCOSE 192*  BUN 18  CREATININE 2.89*  CALCIUM 8.7*  MG 2.2     CBC:  Recent Labs Lab 04/26/17 1211  WBC 7.5  NEUTROABS 5.0  HGB 12.3*  HCT 35.2*  MCV 98.1  PLT 104*     Lab Results  Component Value Date   HEPBSAG Negative 12/28/2016   HEPBSAB Reactive 04/02/2016      Microbiology:  No results found for this or any previous visit (from the past 240 hour(s)).  Coagulation Studies: No results for input(s): LABPROT, INR in the last 72 hours.  Urinalysis: No results for input(s): COLORURINE, LABSPEC, PHURINE, GLUCOSEU, HGBUR, BILIRUBINUR, KETONESUR, PROTEINUR,  UROBILINOGEN, NITRITE, LEUKOCYTESUR in the last 72 hours.  Invalid input(s): APPERANCEUR    Imaging: Dg Chest 2 View  Result Date: 04/26/2017 CLINICAL DATA:  Pain after dialysis. EXAM: CHEST  2 VIEW COMPARISON:  March 17, 2017 FINDINGS: Stable cardiomegaly. Streaky opacity in left retrocardiac region on the frontal view only is likely atelectasis. Increased interstitial markings suggest edema. There are probably tiny effusions. No nodules or masses. No acute infiltrates. IMPRESSION: Probable tiny effusions and edema. Streaky opacity in left retrocardiac region is likely atelectasis. Electronically Signed   By: Gerome Sam III M.D   On: 04/26/2017 16:06   US Venous Img Lower Bilateral  Result Date: 04/26/2017 CLINICAL DATA:  Bilateral leg pain for 2 weeks. Pain occurs while having dialysis. EXAM: BILATERAL LOWER EXTREMITY VENOUS DOPPLER ULTRASOUND TECHNIQUE: Gray-scale sonography with graded compression, as well as color Doppler and duplex ultrasound were performed to evaluate the lower extremity deep venous systems from the level of the common femoral vein and including the common femoral, femoral, profunda femoral, popliteal and calf veins including the posterior tibial, peroneal and gastrocnemius veins when visible. The superficial great saphenous vein was also interrogated. Spectral Doppler was utilized to evaluate flow at rest and with distal augmentation maneuvers in the common femoral, femoral and popliteal veins. COMPARISON:  None. FINDINGS: RIGHT LOWER EXTREMITY Common Femoral Vein: No evidence  of thrombus. Normal compressibility, respiratory phasicity and response to augmentation. Saphenofemoral Junction: No evidence of thrombus. Normal compressibility and flow on color Doppler imaging. Profunda Femoral Vein: No evidence of thrombus. Normal compressibility and flow on color Doppler imaging. Femoral Vein: No evidence of acute thrombus. Normal compressibility, respiratory phasicity and response  to augmentation. Small peripheral echogenic areas in the mid femoral vein are most compatible with calcifications and could be related to remote DVT. Popliteal Vein: No evidence of thrombus. Normal compressibility, respiratory phasicity and response to augmentation. Calf Veins: No evidence of thrombus. Normal compressibility and flow on color Doppler imaging. Other Findings: Elongated fluid collection in the right popliteal fossa measures 4.1 x 1.0 x 2.8 cm. No significant vascular flow within this cystic structure. Findings are compatible with a Baker's cyst. LEFT LOWER EXTREMITY Common Femoral Vein: No evidence of thrombus. Normal compressibility, respiratory phasicity and response to augmentation. Saphenofemoral Junction: No evidence of thrombus. Normal compressibility and flow on color Doppler imaging. Profunda Femoral Vein: No evidence of thrombus. Normal compressibility and flow on color Doppler imaging. Femoral Vein: No evidence of thrombus. Normal compressibility, respiratory phasicity and response to augmentation. Peripheral echoes in the mid left femoral vein probably represent calcifications. Popliteal Vein: No evidence of thrombus. Normal compressibility, respiratory phasicity and response to augmentation. Calf Veins: No evidence of thrombus. Normal compressibility and flow on color Doppler imaging. Other Findings: Small cystic collection in the left popliteal fossa. Structure measures 2.2 x 0.9 x 2.8 cm. IMPRESSION: No evidence for an acute DVT in the lower extremities. Small peripheral calcifications and echogenic structures in the femoral veins bilaterally. 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 Bilateral Baker's cysts. Electronically Signed   By: Richarda Overlie M.D.   On: 04/26/2017 14:21     Medications:       Assessment/ Plan:  51 y.o. male with uncontrolled diabetes mellitus type II, hypertension, hyperlipidemia, hypothyroidism, BPH, coronary artery disease status post  stent, ESRD on HD, Anemia of CKD, SHPTH.   [facility] TTS  1. End stage renal disease TTHS: with Le edema and flash Pulm edema Patient completed his HD earlier today but appears to have flash Pulm edema - repeat HD today - UF 2-2.5 kg as tolerated  2. Anemia of CKD:   - Hold EPO due to high BP  3. Secondary hyperparathyroidism: Monitor Phos.  4. Hypertension:  - restart home regimen - re evaluate after fluid removal       LOS: 0 Arhum Peeples 8/21/20184:36 PM  [facility] [place], [place] [phone]

## 2017-04-26 NOTE — ED Notes (Signed)
Date and time results received: 04/26/17 1653 (use smartphrase ".now" to insert current time)  Test: Troponin Critical Value: 0.03  Name of Provider Notified: Dr. Darnelle Catalan  Orders Received? Or Actions Taken?:

## 2017-04-26 NOTE — Progress Notes (Signed)
HD TX ended  

## 2017-04-26 NOTE — Progress Notes (Signed)
HD started. 

## 2017-04-26 NOTE — Progress Notes (Signed)
PRE HD assessment 

## 2017-04-26 NOTE — ED Notes (Signed)
Upon entering room SpO2 79%. Patient leaned over to his left side, eye closed with even and shallow respirations noted. Patient placed on 4L [place].

## 2017-04-26 NOTE — Progress Notes (Signed)
PRE HD   

## 2017-04-26 NOTE — H&P (Signed)
[facility] - [facility]   PATIENT NAME: Marcus [name]    MR#:  [id number]  DATE OF BIRTH:  30-Aug-1966  DATE OF ADMISSION:  04/26/2017  PRIMARY CARE PHYSICIAN: [facility]   REQUESTING/REFERRING PHYSICIAN: Dr. Darnelle Catalan  CHIEF COMPLAINT:   Chief Complaint  Patient presents with  . Weakness    HISTORY OF PRESENT ILLNESS:  Marcus [name]  is a 51 y.o. 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 He received hemodialysis today for 3 hours. On arrival to emergency room patient's systolic blood pressures greater than 200. Chest x-ray shows pulmonary edema with acute hypoxic respiratory failure needing oxygen. He complains of orthopnea. Has not taken his blood pressure medications today morning. Patient will need stat hemodialysis and admission to the hospital  PAST MEDICAL HISTORY:   Past Medical History:  Diagnosis Date  . BPH (benign prostatic hyperplasia)   . CAD (coronary artery disease) of artery bypass graft    a. s/p multipe PCI to LCx and OMI; Last cath in 07/2014 w/ POBA to 70% stenosis in LCx   . Chronic diastolic CHF (congestive heart failure) (HCC)    a. EF 55-60% by echo in 08/2015; Grade 2 DD noted.  . CPAP (continuous positive airway pressure) dependence   . Depression   . Diabetes mellitus without complication (HCC)   . Dialysis patient [facility])    a. Tue/Th/Sat Schedule  . High cholesterol   . Hypertension   . Renal disorder   . Thyroid disease     PAST SURGICAL HISTORY:   Past Surgical History:  Procedure Laterality Date  . CARDIAC CATHETERIZATION    . DIALYSIS FISTULA CREATION      SOCIAL HISTORY:   Social History  Substance Use Topics  . Smoking status: Former Games developer  . Smokeless  tobacco: Never Used  . Alcohol use No    FAMILY HISTORY:  History reviewed. No pertinent family history.  DRUG ALLERGIES:   Allergies  Allergen Reactions  . Eicosapentaenoic Acid (Epa) Rash  . Fish-Derived Products Rash    REVIEW OF SYSTEMS:   Review of Systems  Constitutional: Positive for malaise/fatigue. Negative for chills, fever and weight loss.  HENT: Negative for hearing loss and nosebleeds.   Eyes: Negative for blurred vision, double vision and pain.  Respiratory: Positive for cough and shortness of breath. Negative for hemoptysis, sputum production and wheezing.   Cardiovascular: Negative for chest pain, palpitations, orthopnea and leg swelling.  Gastrointestinal: Negative for abdominal pain, constipation, diarrhea, nausea and vomiting.  Genitourinary: Negative for dysuria and hematuria.  Musculoskeletal: Positive for myalgias. Negative for back pain and falls.  Skin: Negative for rash.  Neurological: Positive for weakness. Negative for dizziness, tremors, sensory change, speech change, focal weakness, seizures and headaches.  Endo/Heme/Allergies: Does not bruise/bleed easily.  Psychiatric/Behavioral: Negative for depression and memory loss. The patient is not nervous/anxious.     MEDICATIONS AT HOME:   Prior to Admission medications   Medication Sig Start Date End Date Taking? Authorizing Provider  amLODipine (NORVASC) 10 MG tablet Take 10 mg by mouth daily.    [provider]  aspirin EC 81 MG tablet Take 81 mg by mouth daily.    [provider]  atorvastatin (LIPITOR) 80 MG tablet Take 80 mg by mouth at bedtime.  [provider]  calcitRIOL (ROCALTROL) 0.5 MCG capsule Take 1 mcg by mouth 3 (three) times a week. Take 1 mcg by mouth 3 (three) times a week with dialysis. 10/04/16 10/04/17  [provider]  calcium acetate (PHOSLO) 667 MG capsule Take 667 mg by mouth 3 (three) times daily with meals.     [provider]   carbamazepine (CARBATROL) 100 MG 12 hr capsule Take 300 mg by mouth 2 (two) times daily.  12/17/16 12/17/17  [provider]  carvedilol (COREG) 25 MG tablet Take 25 mg by mouth 2 (two) times daily.    [provider]  cloNIDine (CATAPRES) 0.2 MG tablet Take 0.2 mg by mouth 2 (two) times daily.     [provider]  clopidogrel (PLAVIX) 75 MG tablet Take 75 mg by mouth at bedtime.    [provider]  doxazosin (CARDURA) 1 MG tablet Take 1 mg by mouth at bedtime.    [provider]  FLUoxetine (PROZAC) 40 MG capsule Take 40 mg by mouth daily.    [provider]  gabapentin (NEURONTIN) 100 MG capsule Take 300 mg by mouth at bedtime.     [provider]  hydrALAZINE (APRESOLINE) 100 MG tablet Take 1 tablet (100 mg total) by mouth 3 (three) times daily. Patient not taking: Reported on 03/17/2017 04/17/16   Adrian Saran, MD  hydrALAZINE (APRESOLINE) 50 MG tablet Take 50 mg by mouth 3 (three) times daily.    [provider]  HYDROcodone-acetaminophen (NORCO/VICODIN) 5-325 MG tablet Take 1 tablet by mouth every 6 (six) hours as needed for moderate pain. Patient not taking: Reported on 03/17/2017 12/30/16   Altamese Dilling, MD  insulin aspart protamine- aspart (NOVOLOG MIX 70/30) (70-30) 100 UNIT/ML injection Inject 0.05 mLs (5 Units total) into the skin daily with breakfast. Patient not taking: Reported on 12/28/2016 10/24/15   Hower, Cletis Athens, MD  insulin aspart protamine- aspart (NOVOLOG MIX 70/30) (70-30) 100 UNIT/ML injection Inject 0.1 mLs (10 Units total) into the skin daily with supper. Patient not taking: Reported on 12/28/2016 10/24/15   Hower, Cletis Athens, MD  insulin NPH Human (HUMULIN N,NOVOLIN N) 100 UNIT/ML injection Inject 3 Units into the skin 2 (two) times daily. 5 units in the morning and 10 units at night. 09/21/16   [provider]  irbesartan (AVAPRO) 300 MG tablet Take 1 tablet (300 mg total) by mouth daily after  supper. Patient not taking: Reported on 12/28/2016 04/17/16   Adrian Saran, MD  isosorbide dinitrate (ISORDIL) 30 MG tablet Take 60 mg by mouth daily.    [provider]  latanoprost (XALATAN) 0.005 % ophthalmic solution Place 1 drop into both eyes daily. 03/17/17 03/17/18  Merrily Brittle, MD  levothyroxine (SYNTHROID, LEVOTHROID) 100 MCG tablet Take 100 mcg by mouth daily before breakfast.    [provider]  lidocaine (LIDODERM) 5 % Place 1 patch onto the skin daily. Remove & Discard patch within 12 hours or as directed by MD    [provider]  nitroGLYCERIN (NITROSTAT) 0.4 MG SL tablet Take 0.4 mg by mouth every 5 (five) minutes as needed. 10/24/12   [provider]  omeprazole (PRILOSEC) 40 MG capsule Take 40 mg by mouth daily.    [provider]  sevelamer carbonate (RENVELA) 800 MG tablet Take 800 mg by mouth every 8 (eight) hours.    [provider]  traZODone (DESYREL) 50 MG tablet Take 25 mg by mouth at bedtime.    [provider]     VITAL SIGNS:  Blood pressure (!) 228/91, pulse 85, temperature 98.3 F (36.8 C), temperature source Oral, resp. rate 16, height 5' (1.524 m), weight 72.6 kg (160 lb), SpO2 95 %.  PHYSICAL EXAMINATION:  Physical Exam  GENERAL:  51 y.o.-year-old patient lying in the bed with Conversational dyspnea EYES: Pupils equal, round, reactive to light and accommodation. No scleral icterus. Extraocular muscles intact.  HEENT: Head atraumatic, normocephalic. Oropharynx and nasopharynx clear. No oropharyngeal erythema, moist oral mucosa  NECK:  Supple, no jugular venous distention. No thyroid enlargement, no tenderness.  LUNGS: Bilateral crackles. Good air entry. CARDIOVASCULAR: S1, S2 normal. No murmurs, rubs, or gallops.  ABDOMEN: Soft, nontender, nondistended. Bowel sounds present. No organomegaly or mass.  EXTREMITIES: No pedal edema, cyanosis, or clubbing. + 2 pedal & radial pulses b/l.   NEUROLOGIC:  Cranial nerves II through XII are intact. No focal Motor or sensory deficits appreciated b/l PSYCHIATRIC: The patient is alert and oriented x 3. Good affect.  SKIN: No obvious rash, lesion, or ulcer.   LABORATORY PANEL:   CBC  Recent Labs Lab 04/26/17 1211  WBC 7.5  HGB 12.3*  HCT 35.2*  PLT 104*   ------------------------------------------------------------------------------------------------------------------  Chemistries   Recent Labs Lab 04/26/17 1211  NA 140  K 3.6  CL 96*  CO2 33*  GLUCOSE 192*  BUN 18  CREATININE 2.89*  CALCIUM 8.7*  MG 2.2  AST 23  ALT 13*  ALKPHOS 130*  BILITOT 0.7   ------------------------------------------------------------------------------------------------------------------  Cardiac Enzymes  Recent Labs Lab 04/26/17 1601  TROPONINI 0.03*   ------------------------------------------------------------------------------------------------------------------  RADIOLOGY:  Dg Chest 2 View  Result Date: 04/26/2017 CLINICAL DATA:  Pain after dialysis. EXAM: CHEST  2 VIEW COMPARISON:  March 17, 2017 FINDINGS: Stable cardiomegaly. Streaky opacity in left retrocardiac region on the frontal view only is likely atelectasis. Increased interstitial markings suggest edema. There are probably tiny effusions. No nodules or masses. No acute infiltrates. IMPRESSION: Probable tiny effusions and edema. Streaky opacity in left retrocardiac region is likely atelectasis. Electronically Signed   By: Gerome Sam III M.D   On: 04/26/2017 16:06   US Venous Img Lower Bilateral  Result Date: 04/26/2017 CLINICAL DATA:  Bilateral leg pain for 2 weeks. Pain occurs while having dialysis. EXAM: BILATERAL LOWER EXTREMITY VENOUS DOPPLER ULTRASOUND TECHNIQUE: Gray-scale sonography with graded compression, as well as color Doppler and duplex ultrasound were performed to evaluate the lower extremity deep venous systems from the level of the common femoral vein and  including the common femoral, femoral, profunda femoral, popliteal and calf veins including the posterior tibial, peroneal and gastrocnemius veins when visible. The superficial great saphenous vein was also interrogated. Spectral Doppler was utilized to evaluate flow at rest and with distal augmentation maneuvers in the common femoral, femoral and popliteal veins. COMPARISON:  None. FINDINGS: RIGHT LOWER EXTREMITY Common Femoral Vein: No evidence of thrombus. Normal compressibility, respiratory phasicity and response to augmentation. Saphenofemoral Junction: No evidence of thrombus. Normal compressibility and flow on color Doppler imaging. Profunda Femoral Vein: No evidence of thrombus. Normal compressibility and flow on color Doppler imaging. Femoral Vein: No evidence of acute thrombus. Normal compressibility, respiratory phasicity and response to augmentation. Small peripheral echogenic areas in the mid femoral vein are most compatible with calcifications and could be related to remote DVT. Popliteal Vein: No evidence of thrombus. Normal compressibility, respiratory phasicity and response to augmentation. Calf Veins: No evidence of thrombus. Normal compressibility and flow on color Doppler imaging. Other Findings:  Elongated fluid collection in the right popliteal fossa measures 4.1 x 1.0 x 2.8 cm. No significant vascular flow within this cystic structure. Findings are compatible with a Baker's cyst. LEFT LOWER EXTREMITY Common Femoral Vein: No evidence of thrombus. Normal compressibility, respiratory phasicity and response to augmentation. Saphenofemoral Junction: No evidence of thrombus. Normal compressibility and flow on color Doppler imaging. Profunda Femoral Vein: No evidence of thrombus. Normal compressibility and flow on color Doppler imaging. Femoral Vein: No evidence of thrombus. Normal compressibility, respiratory phasicity and response to augmentation. Peripheral echoes in the mid left femoral vein  probably represent calcifications. Popliteal Vein: No evidence of thrombus. Normal compressibility, respiratory phasicity and response to augmentation. Calf Veins: No evidence of thrombus. Normal compressibility and flow on color Doppler imaging. Other Findings: Small cystic collection in the left popliteal fossa. Structure measures 2.2 x 0.9 x 2.8 cm. IMPRESSION: No evidence for an acute DVT in the lower extremities. Small peripheral calcifications and echogenic structures in the femoral veins bilaterally. 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 Bilateral Baker's cysts. Electronically Signed   By: Richarda Overlie M.D.   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  Acute on chronic diastolic congestive heart failure decompensated likely due to uncontrolled blood pressure and inadequate hemodialysis. Discussed with nephrology Dr. Thedore Mins. Patient will need stat hemodialysis. Will reassess after dialysis. Acute hypoxic respiratory failure due to CHF  * Hypertensive emergency. Patient has not taken his morning medications. We will resume his home medications. Also hemodialysis should help.  * Insulin-dependent diabetes mellitus. Continue patient's home insulin at reduced dose. Sliding scale insulin. Diabetic renal diet.  * DVT prophylaxis with subcutaneous heparin.  All the records are reviewed and case discussed with ED provider. Management plans discussed with the patient, family and they are in agreement.  CODE STATUS: FULL CODE  TOTAL TIME TAKING CARE OF THIS PATIENT: 40 minutes.   Milagros Loll R M.D on 04/26/2017 at 5:08 PM  Between 7am to 6pm - Pager - [phone]  After 6pm go to [email or web address] - password [email or web address]  [facility]  Office  [phone]  CC: Primary care physician; [facility]  Note: This dictation was prepared with Dragon dictation along with smaller phrase  technology. Any transcriptional errors that result from this process are unintentional.

## 2017-04-27 LAB — MRSA PCR SCREENING: MRSA by PCR: NEGATIVE

## 2017-04-27 LAB — GLUCOSE, CAPILLARY
GLUCOSE-CAPILLARY: 77 mg/dL (ref 65–99)
Glucose-Capillary: 61 mg/dL — ABNORMAL LOW (ref 65–99)

## 2017-04-27 LAB — BASIC METABOLIC PANEL
Anion gap: 11 (ref 5–15)
BUN: 31 mg/dL — AB (ref 6–20)
CHLORIDE: 98 mmol/L — AB (ref 101–111)
CO2: 32 mmol/L (ref 22–32)
CREATININE: 4.41 mg/dL — AB (ref 0.61–1.24)
Calcium: 8.3 mg/dL — ABNORMAL LOW (ref 8.9–10.3)
GFR calc Af Amer: 16 mL/min — ABNORMAL LOW (ref >60)
GFR calc non Af Amer: 14 mL/min — ABNORMAL LOW (ref >60)
Glucose, Bld: 84 mg/dL (ref 65–99)
POTASSIUM: 4.7 mmol/L (ref 3.5–5.1)
Sodium: 141 mmol/L (ref 135–145)

## 2017-04-27 LAB — CBC
HEMATOCRIT: 32.7 % — AB (ref 40.0–52.0)
Hemoglobin: 11.2 g/dL — ABNORMAL LOW (ref 13.0–18.0)
MCH: 34.4 pg — AB (ref 26.0–34.0)
MCHC: 34.3 g/dL (ref 32.0–36.0)
MCV: 100.1 fL — AB (ref 80.0–100.0)
PLATELETS: 102 10*3/uL — AB (ref 150–440)
RBC: 3.26 MIL/uL — AB (ref 4.40–5.90)
RDW: 16.5 % — AB (ref 11.5–14.5)
WBC: 6.8 10*3/uL (ref 3.8–10.6)

## 2017-04-27 NOTE — Progress Notes (Signed)
Pt arrived from dialysis alert and oriented. Pt speaks some english, spanish interpreter contacted for admission questions and other pt concerns. Family contacted per pt request. Daughters Elease Hashimoto and Randa Evens made aware. Telemetry and skin verified. No SOB, no concerns offered.

## 2017-04-27 NOTE — Progress Notes (Signed)
[facility], [place] 04/27/17  Subjective:   3 L removed with HD yesterday This morning, standing weight is 62.5 kg No leg edema No cough or SOB States he uses Oxygen at home  Objective:  Vital signs in last 24 hours:  Temp:  [97.6 F (36.4 C)-98.4 F (36.9 C)] 98.1 F (36.7 C) (08/22 0929) Pulse Rate:  [60-85] 69 (08/22 0929) Resp:  [16-23] 18 (08/22 0929) BP: (158-230)/(59-94) 158/85 (08/22 0929) SpO2:  [79 %-100 %] 98 % (08/22 0929) Weight:  [72.5 kg (159 lb 13.3 oz)-72.6 kg (160 lb)] 72.5 kg (159 lb 13.3 oz) (08/21 1700)  Weight change:  Filed Weights   04/26/17 1200 04/26/17 1700  Weight: 72.6 kg (160 lb) 72.5 kg (159 lb 13.3 oz)    Intake/Output:    Intake/Output Summary (Last 24 hours) at 04/27/17 0934 Last data filed at 04/26/17 1915  Gross per 24 hour  Intake                0 ml  Output             3000 ml  Net            -3000 ml     Physical Exam: General: Doing well  HEENT Injected conjunctiva  Neck supple  Pulm/lungs B/l clear today  CVS/Heart Regular,   Abdomen:  Soft, non tender  Extremities: No b/l edema  Neurologic: Alert, able to follow commands  Skin: No rashes  Access Left forearm AVF       Basic Metabolic Panel:   Recent Labs Lab 04/26/17 1211 04/27/17 0628  NA 140 141  K 3.6 4.7  CL 96* 98*  CO2 33* 32  GLUCOSE 192* 84  BUN 18 31*  CREATININE 2.89* 4.41*  CALCIUM 8.7* 8.3*  MG 2.2  --      CBC:  Recent Labs Lab 04/26/17 1211 04/27/17 0628  WBC 7.5 6.8  NEUTROABS 5.0  --   HGB 12.3* 11.2*  HCT 35.2* 32.7*  MCV 98.1 100.1*  PLT 104* 102*      Lab Results  Component Value Date   HEPBSAG Negative 12/28/2016   HEPBSAB Reactive 04/02/2016      Microbiology:  No results found for this or any previous visit (from the past 240 hour(s)).  Coagulation Studies: No results for input(s): LABPROT, INR in the last 72 hours.  Urinalysis: No results for input(s): COLORURINE,  LABSPEC, PHURINE, GLUCOSEU, HGBUR, BILIRUBINUR, KETONESUR, PROTEINUR, UROBILINOGEN, NITRITE, LEUKOCYTESUR in the last 72 hours.  Invalid input(s): APPERANCEUR    Imaging: Dg Chest 2 View  Result Date: 04/26/2017 CLINICAL DATA:  Pain after dialysis. EXAM: CHEST  2 VIEW COMPARISON:  March 17, 2017 FINDINGS: Stable cardiomegaly. Streaky opacity in left retrocardiac region on the frontal view only is likely atelectasis. Increased interstitial markings suggest edema. There are probably tiny effusions. No nodules or masses. No acute infiltrates. IMPRESSION: Probable tiny effusions and edema. Streaky opacity in left retrocardiac region is likely atelectasis. Electronically Signed   By: Gerome Sam III M.D   On: 04/26/2017 16:06   US Venous Img Lower Bilateral  Result Date: 04/26/2017 CLINICAL DATA:  Bilateral leg pain for 2 weeks. Pain occurs while having dialysis. EXAM: BILATERAL LOWER EXTREMITY VENOUS DOPPLER ULTRASOUND TECHNIQUE: Gray-scale sonography with graded compression, as well as color Doppler and duplex ultrasound were performed to evaluate the lower extremity deep venous systems from the level of the common femoral vein and including the common femoral, femoral,  profunda femoral, popliteal and calf veins including the posterior tibial, peroneal and gastrocnemius veins when visible. The superficial great saphenous vein was also interrogated. Spectral Doppler was utilized to evaluate flow at rest and with distal augmentation maneuvers in the common femoral, femoral and popliteal veins. COMPARISON:  None. FINDINGS: RIGHT LOWER EXTREMITY Common Femoral Vein: No evidence of thrombus. Normal compressibility, respiratory phasicity and response to augmentation. Saphenofemoral Junction: No evidence of thrombus. Normal compressibility and flow on color Doppler imaging. Profunda Femoral Vein: No evidence of thrombus. Normal compressibility and flow on color Doppler imaging. Femoral Vein: No evidence of  acute thrombus. Normal compressibility, respiratory phasicity and response to augmentation. Small peripheral echogenic areas in the mid femoral vein are most compatible with calcifications and could be related to remote DVT. Popliteal Vein: No evidence of thrombus. Normal compressibility, respiratory phasicity and response to augmentation. Calf Veins: No evidence of thrombus. Normal compressibility and flow on color Doppler imaging. Other Findings: Elongated fluid collection in the right popliteal fossa measures 4.1 x 1.0 x 2.8 cm. No significant vascular flow within this cystic structure. Findings are compatible with a Baker's cyst. LEFT LOWER EXTREMITY Common Femoral Vein: No evidence of thrombus. Normal compressibility, respiratory phasicity and response to augmentation. Saphenofemoral Junction: No evidence of thrombus. Normal compressibility and flow on color Doppler imaging. Profunda Femoral Vein: No evidence of thrombus. Normal compressibility and flow on color Doppler imaging. Femoral Vein: No evidence of thrombus. Normal compressibility, respiratory phasicity and response to augmentation. Peripheral echoes in the mid left femoral vein probably represent calcifications. Popliteal Vein: No evidence of thrombus. Normal compressibility, respiratory phasicity and response to augmentation. Calf Veins: No evidence of thrombus. Normal compressibility and flow on color Doppler imaging. Other Findings: Small cystic collection in the left popliteal fossa. Structure measures 2.2 x 0.9 x 2.8 cm. IMPRESSION: No evidence for an acute DVT in the lower extremities. Small peripheral calcifications and echogenic structures in the femoral veins bilaterally. 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 Bilateral Baker's cysts. Electronically Signed   By: Richarda Overlie M.D.   On: 04/26/2017 14:21     Medications:       Assessment/ Plan:  51 y.o. male with uncontrolled diabetes mellitus type II, hypertension,  hyperlipidemia, hypothyroidism, BPH, coronary artery disease status post stent, ESRD on HD, Anemia of CKD, SHPTH.   [facility] Nephrology TTS  1. End stage renal disease TTHS: with Le edema and flash Pulm edema Patient completed his HD earlier today but appears to have flash Pulm edema -  3 L removed with HD - new EDW 62.5 kg; relayed to outpatient dialysis - Sequential mode used with HD last evening for volume removal  2. Anemia of CKD:   - resume outpatient management  3. Secondary hyperparathyroidism: Monitor Phos.  4. Hypertension:  - restart home regimen - re-evaluate as outpateint      LOS: 1 [facility] 8/22/20189:34 AM  Marcus [name] [place], [place] [phone]

## 2017-04-27 NOTE — Care Management (Signed)
CM consult for medication needs. spoke with patient through an interpretor. Patient is followed by [facility] and denies any issues obtaining medications.    Patient is chronic hemodialysis [facility]  Updated Amanda Morris with [facility]. Patient discharging today.  No other needs identified

## 2017-04-27 NOTE — Discharge Summary (Signed)
[employer] - [place] at [facility]   PATIENT NAME: Marcus [name]    MR#:  [id number]  DATE OF BIRTH:  [birth year]  DATE OF ADMISSION:  04/26/2017 ADMITTING PHYSICIAN: Milagros Loll, MD  DATE OF DISCHARGE: 04/27/2017  1:06 PM  PRIMARY CARE PHYSICIAN: [place], [facility]    ADMISSION DIAGNOSIS:  Pain [R52] Acute on chronic systolic congestive heart failure (HCC) [I50.23] Pulmonary edema [J81.1]  DISCHARGE DIAGNOSIS:  Active Problems:   Pulmonary edema   SECONDARY DIAGNOSIS:   Past Medical History:  Diagnosis Date  . BPH (benign prostatic hyperplasia)   . CAD (coronary artery disease) of artery bypass graft    a. s/p multipe PCI to LCx and OMI; Last cath in 07/2014 w/ POBA to 70% stenosis in LCx   . Chronic diastolic CHF (congestive heart failure) (HCC)    a. EF 55-60% by echo in 08/2015; Grade 2 DD noted.  . CPAP (continuous positive airway pressure) dependence   . Depression   . Diabetes mellitus without complication (HCC)   . Dialysis patient [facility])    a. Tue/Th/Sat Schedule  . High cholesterol   . Hypertension   . Renal disorder   . Thyroid disease     HOSPITAL COURSE:   1. Acute diastolic congestive heart failure in a dialysis patient. The patient was dialyzed and now feels better. The patient does not urinate so dialysis is the only way to manage fluid. The patient is on Coreg, hydralazine, imdur and Avapro. 2. Hypertensive urgency. The patient was admitted with elevated blood pressure because he did not take his morning medications. 3. Insulin-dependent diabetes mellitus. Patient is on 70/30 insulin. 4. Hypothyroidism unspecified on levothyroxin 5. Hyperlipidemia unspecified on atorvastatin  DISCHARGE CONDITIONS:   Satisfactory  CONSULTS OBTAINED:   nephrology  DRUG ALLERGIES:   Allergies  Allergen Reactions  . Eicosapentaenoic Acid (Epa) Rash  . Fish-Derived Products Rash    DISCHARGE MEDICATIONS:   Discharge  Medication List as of 04/27/2017 11:59 AM    CONTINUE these medications which have NOT CHANGED   Details  amLODipine (NORVASC) 10 MG tablet Take 10 mg by mouth daily., Historical Med    aspirin EC 81 MG tablet Take 81 mg by mouth daily., Historical Med    atorvastatin (LIPITOR) 80 MG tablet Take 80 mg by mouth at bedtime., Historical Med    calcitRIOL (ROCALTROL) 0.5 MCG capsule Take 1 mcg by mouth 3 (three) times a week. Take 1 mcg by mouth 3 (three) times a week with dialysis., Starting Mon 10/04/2016, Until Tue 10/04/2017, Historical Med    calcium acetate (PHOSLO) 667 MG capsule Take 667 mg by mouth 3 (three) times daily with meals. , Historical Med    carbamazepine (CARBATROL) 100 MG 12 hr capsule Take 300 mg by mouth 2 (two) times daily. , Starting Fri 12/17/2016, Until Sat 12/17/2017, Historical Med    carvedilol (COREG) 25 MG tablet Take 25 mg by mouth 2 (two) times daily., Historical Med    cloNIDine (CATAPRES) 0.2 MG tablet Take 0.2 mg by mouth 2 (two) times daily. , Historical Med    clopidogrel (PLAVIX) 75 MG tablet Take 75 mg by mouth at bedtime., Historical Med    doxazosin (CARDURA) 1 MG tablet Take 1 mg by mouth at bedtime., Historical Med    FLUoxetine (PROZAC) 40 MG capsule Take 40 mg by mouth daily., Historical Med    gabapentin (NEURONTIN) 100 MG capsule Take 300 mg by mouth at bedtime. , Historical Med  hydrALAZINE (APRESOLINE) 50 MG tablet Take 50 mg by mouth 3 (three) times daily., Historical Med    HYDROcodone-acetaminophen (NORCO/VICODIN) 5-325 MG tablet Take 1 tablet by mouth every 6 (six) hours as needed for moderate pain., Starting Thu 12/30/2016, Print    !! insulin aspart protamine- aspart (NOVOLOG MIX 70/30) (70-30) 100 UNIT/ML injection Inject 0.05 mLs (5 Units total) into the skin daily with breakfast., Starting Fri 10/24/2015, Print    !! insulin aspart protamine- aspart (NOVOLOG MIX 70/30) (70-30) 100 UNIT/ML injection Inject 0.1 mLs (10 Units total)  into the skin daily with supper., Starting Fri 10/24/2015, Print    insulin NPH Human (HUMULIN N,NOVOLIN N) 100 UNIT/ML injection Inject 3 Units into the skin 2 (two) times daily. 5 units in the morning and 10 units at night., Starting Tue 09/21/2016, Historical Med    irbesartan (AVAPRO) 300 MG tablet Take 1 tablet (300 mg total) by mouth daily after supper., Starting Sat 04/17/2016, Normal    isosorbide dinitrate (ISORDIL) 30 MG tablet Take 60 mg by mouth daily., Historical Med    latanoprost (XALATAN) 0.005 % ophthalmic solution Place 1 drop into both eyes daily., Starting Thu 03/17/2017, Until Fri 03/17/2018, Print    levothyroxine (SYNTHROID, LEVOTHROID) 100 MCG tablet Take 100 mcg by mouth daily before breakfast., Historical Med    lidocaine (LIDODERM) 5 % Place 1 patch onto the skin daily. Remove & Discard patch within 12 hours or as directed by MD, Historical Med    nitroGLYCERIN (NITROSTAT) 0.4 MG SL tablet Take 0.4 mg by mouth every 5 (five) minutes as needed., Starting Tue 10/24/2012, Historical Med    omeprazole (PRILOSEC) 40 MG capsule Take 40 mg by mouth daily., Historical Med    sevelamer carbonate (RENVELA) 800 MG tablet Take 800 mg by mouth every 8 (eight) hours., Historical Med    traZODone (DESYREL) 50 MG tablet Take 25 mg by mouth at bedtime., Historical Med     !! - Potential duplicate medications found. Please discuss with provider.       DISCHARGE INSTRUCTIONS:   Follow-up PMD one week Follow-up dialysis as scheduled  If you experience worsening of your admission symptoms, develop shortness of breath, life threatening emergency, suicidal or homicidal thoughts you must seek medical attention immediately by calling 911 or calling your MD immediately  if symptoms less severe.  You Must read complete instructions/literature along with all the possible adverse reactions/side effects for all the Medicines you take and that have been prescribed to you. Take any new  Medicines after you have completely understood and accept all the possible adverse reactions/side effects.   Please note  You were cared for by a hospitalist during your hospital stay. If you have any questions about your discharge medications or the care you received while you were in the hospital after you are discharged, you can call the unit and asked to speak with the hospitalist on call if the hospitalist that took care of you is not available. Once you are discharged, your primary care physician will handle any further medical issues. Please note that NO REFILLS for any discharge medications will be authorized once you are discharged, as it is imperative that you return to your primary care physician (or establish a relationship with a primary care physician if you do not have one) for your aftercare needs so that they can reassess your need for medications and monitor your lab values.    Today   CHIEF COMPLAINT:   Chief Complaint  Patient  presents with  . Weakness    HISTORY OF PRESENT ILLNESS:  Marcus [name]  is a 51 y.o. male with a known history of End-stage renal disease presented with weakness and found to be in fluid overload   VITAL SIGNS:  Blood pressure (!) 140/52, pulse 65, temperature (!) 97.4 F (36.3 C), resp. rate 16, height 5' (1.524 m), weight 72.5 kg (159 lb 13.3 oz), SpO2 (!) 89 %.  Patient's pulse ox when I saw him was 98% on room air  PHYSICAL EXAMINATION:  GENERAL:  51 y.o.-year-old patient lying in the bed with no acute distress.  EYES: Pupils equal, round, reactive to light and accommodation. No scleral icterus. Extraocular muscles intact.  HEENT: Head atraumatic, normocephalic. Oropharynx and nasopharynx clear.  NECK:  Supple, no jugular venous distention. No thyroid enlargement, no tenderness.  LUNGS: Normal breath sounds bilaterally, no wheezing, rales,rhonchi or crepitation. No use of accessory muscles of respiration.  CARDIOVASCULAR: S1, S2  normal. No murmurs, rubs, or gallops.  ABDOMEN: Soft, non-tender, non-distended. Bowel sounds present. No organomegaly or mass.  EXTREMITIES: No pedal edema, cyanosis, or clubbing.  NEUROLOGIC: Cranial nerves II through XII are intact. Muscle strength 5/5 in all extremities. Sensation intact. Gait not checked.  PSYCHIATRIC: The patient is alert and oriented x 3.  SKIN: No obvious rash, lesion, or ulcer.   DATA REVIEW:   CBC  Recent Labs Lab 04/27/17 0628  WBC 6.8  HGB 11.2*  HCT 32.7*  PLT 102*    Chemistries   Recent Labs Lab 04/26/17 1211 04/27/17 0628  NA 140 141  K 3.6 4.7  CL 96* 98*  CO2 33* 32  GLUCOSE 192* 84  BUN 18 31*  CREATININE 2.89* 4.41*  CALCIUM 8.7* 8.3*  MG 2.2  --   AST 23  --   ALT 13*  --   ALKPHOS 130*  --   BILITOT 0.7  --     Cardiac Enzymes  Recent Labs Lab 04/26/17 1601  TROPONINI 0.03*    Microbiology Results  Results for orders placed or performed during the hospital encounter of 04/26/17  MRSA PCR Screening     Status: None   Collection Time: 04/27/17  9:00 AM  Result Value Ref Range Status   MRSA by PCR NEGATIVE NEGATIVE Final    Comment:        The GeneXpert MRSA Assay (FDA approved for NASAL specimens only), is one component of a comprehensive MRSA colonization surveillance program. It is not intended to diagnose MRSA infection nor to guide or monitor treatment for MRSA infections.     RADIOLOGY:  Dg Chest 2 View  Result Date: 04/26/2017 CLINICAL DATA:  Pain after dialysis. EXAM: CHEST  2 VIEW COMPARISON:  March 17, 2017 FINDINGS: Stable cardiomegaly. Streaky opacity in left retrocardiac region on the frontal view only is likely atelectasis. Increased interstitial markings suggest edema. There are probably tiny effusions. No nodules or masses. No acute infiltrates. IMPRESSION: Probable tiny effusions and edema. Streaky opacity in left retrocardiac region is likely atelectasis. Electronically Signed   By: Gerome Sam III M.D   On: 04/26/2017 16:06   US Venous Img Lower Bilateral  Result Date: 04/26/2017 CLINICAL DATA:  Bilateral leg pain for 2 weeks. Pain occurs while having dialysis. EXAM: BILATERAL LOWER EXTREMITY VENOUS DOPPLER ULTRASOUND TECHNIQUE: Gray-scale sonography with graded compression, as well as color Doppler and duplex ultrasound were performed to evaluate the lower extremity deep venous systems from the level of the common femoral vein  and including the common femoral, femoral, profunda femoral, popliteal and calf veins including the posterior tibial, peroneal and gastrocnemius veins when visible. The superficial great saphenous vein was also interrogated. Spectral Doppler was utilized to evaluate flow at rest and with distal augmentation maneuvers in the common femoral, femoral and popliteal veins. COMPARISON:  None. FINDINGS: RIGHT LOWER EXTREMITY Common Femoral Vein: No evidence of thrombus. Normal compressibility, respiratory phasicity and response to augmentation. Saphenofemoral Junction: No evidence of thrombus. Normal compressibility and flow on color Doppler imaging. Profunda Femoral Vein: No evidence of thrombus. Normal compressibility and flow on color Doppler imaging. Femoral Vein: No evidence of acute thrombus. Normal compressibility, respiratory phasicity and response to augmentation. Small peripheral echogenic areas in the mid femoral vein are most compatible with calcifications and could be related to remote DVT. Popliteal Vein: No evidence of thrombus. Normal compressibility, respiratory phasicity and response to augmentation. Calf Veins: No evidence of thrombus. Normal compressibility and flow on color Doppler imaging. Other Findings: Elongated fluid collection in the right popliteal fossa measures 4.1 x 1.0 x 2.8 cm. No significant vascular flow within this cystic structure. Findings are compatible with a Baker's cyst. LEFT LOWER EXTREMITY Common Femoral Vein: No evidence of  thrombus. Normal compressibility, respiratory phasicity and response to augmentation. Saphenofemoral Junction: No evidence of thrombus. Normal compressibility and flow on color Doppler imaging. Profunda Femoral Vein: No evidence of thrombus. Normal compressibility and flow on color Doppler imaging. Femoral Vein: No evidence of thrombus. Normal compressibility, respiratory phasicity and response to augmentation. Peripheral echoes in the mid left femoral vein probably represent calcifications. Popliteal Vein: No evidence of thrombus. Normal compressibility, respiratory phasicity and response to augmentation. Calf Veins: No evidence of thrombus. Normal compressibility and flow on color Doppler imaging. Other Findings: Small cystic collection in the left popliteal fossa. Structure measures 2.2 x 0.9 x 2.8 cm. IMPRESSION: No evidence for an acute DVT in the lower extremities. Small peripheral calcifications and echogenic structures in the femoral veins bilaterally. 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 Bilateral Baker's cysts. Electronically Signed   By: Richarda Overlie M.D.   On: 04/26/2017 14:21      Management plans discussed with the patient, and he is in agreement.  CODE STATUS:  Code Status History    Date Active Date Inactive Code Status Order ID Comments User Context   04/26/2017  5:04 PM 04/27/2017  4:11 PM Full Code [id number]  Milagros Loll, MD ED   12/28/2016  3:54 PM 12/31/2016 12:07 AM DNR [id number]  Adrian Saran, MD Inpatient   04/13/2016 11:19 AM 04/13/2016  3:05 PM Full Code [id number]  Caryn Bee, RN Inpatient   04/12/2016  4:53 PM 04/13/2016 11:19 AM Full Code [id number]  Katha Hamming, MD ED   04/02/2016  8:57 PM 04/03/2016 10:40 PM Full Code [id number]  Ramonita Lab, MD Inpatient   10/21/2015 10:27 AM 10/23/2015  8:21 PM Full Code [id number]  Adrian Saran, MD Inpatient    Advance Directive Documentation     Most Recent Value  Type of Advance Directive  Out of facility DNR (pink  MOST or yellow form)  Pre-existing out of facility DNR order (yellow form or pink MOST form)  -  "MOST" Form in Place?  -      TOTAL TIME TAKING CARE OF THIS PATIENT: 35 minutes. Translator provided by the hospital was used and communication with this patient.   Alford Highland M.D on 04/27/2017 at 4:26 PM  Between 7am to 6pm - Pager -  [phone]  After 6pm go to [email or web address] - Social research officer, government  [employer] Office  [phone]  CC: Primary care physician; [facility]

## 2017-04-27 NOTE — Progress Notes (Signed)
Discharge instructions reviewed with patient and family member with use of interpretor. IV and telemetry removed. Pt escorted down via wheelchair by volunteer.

## 2017-04-27 NOTE — Discharge Instructions (Signed)
Insuficiencia cardaca (Heart Failure) La insuficiencia cardaca es una afeccin en la que el corazn tiene dificultad para bombear la sangre porque se ha debilitado o entumecido. Esto implica que el corazn no bombea sangre de [place] eficiente para que el organismo pueda funcionar bien. En algunas personas con insuficiencia cardaca, el lquido puede regresar a los pulmones y se puede producir hinchazn (edema) en la parte inferior de las piernas. La insuficiencia cardaca por lo general es una enfermedad de larga duracin (crnica). Es importante que se cuide mucho y que siga el plan de tratamiento que le indique su mdico. CAUSAS Las causas de esta afeccin son algunas enfermedades que [place], [place] otras:  Presin arterial elevada (hipertensin arterial). La hipertensin hace que el msculo cardaco trabaje ms de lo normal. La hipertensin arterial hace que con el tiempo el corazn se vuelva rgido y dbil.  Enfermedad arterial coronaria (EAC). La enfermedad arterial coronaria es la acumulacin de colesterol y grasa (placas) en las arterias del corazn.  Ataque cardaco (infarto de miocardio). El tejido daado, consecuencia del infarto, no se contrae bien y Scientist, research (life sciences) la capacidad del corazn para Insurance account manager.  Vlvulas cardacas anormales. Cuando las vlvulas cardacas anormales no se abren y Chemical engineer, el msculo cardaco debe bombear mucho ms para mantener el flujo sanguneo.  Enfermedad del msculo cardaco (miocardiopata o miocarditis). La enfermedad del msculo cardaco es el dao que se produce en este msculo debido a diversas causas, por ejemplo, abuso de drogas o alcohol, infecciones o causas desconocidas. Todas estas causas aumentan el riesgo de insuficiencia cardaca.  Enfermedad pulmonar. Cuando los pulmones no funcionan adecuadamente, el corazn debe trabajar con ms esfuerzo. FACTORES DE RIESGO El riesgo de insuficiencia cardaca aumenta con la edad. Tambin es  ms probable que Dietitian en las personas que:  Tienen sobrepeso.  Son hombres.  Fuman o mastican tabaco.  Consumen alcohol o drogas ilegales en forma excesiva.  Han tomado medicamentos que pueden daar el corazn, como los medicamentos de quimioterapia.  Tienen diabetes. ? El nivel elevado de azcar en la sangre (glucosa) contribuye a aumentar los [place] de grasas (lpidos) en la [place]. ? La diabetes tambin puede daar los pequeos vasos sanguneos que transportan nutrientes importantes al msculo cardaco.  Tienen ritmo cardaco anormal.  Tienen problemas de tiroides.  Tienen bajo recuento de glbulos rojos (anemia). SNTOMAS Los sntomas de esta afeccin incluyen lo siguiente:  Falta de aire al realizar actividades, como subir escaleras.  Tos persistente.  Hinchazn de los pies, tobillos, piernas o abdomen.  Aumento de peso sin motivo.  Dificultad para respirar al estar acostado (ortopnea).  Despertarse con la necesidad de sentarse y tomar aire.  Latidos cardacos rpidos.  Fatiga y prdida de Engineer, drilling.  Mareos, o sensacin de desmayo o desvanecimiento.  Prdida del apetito.  Nuseas.  Orina con ms frecuencia durante la noche (nocturia).  Confusin. DIAGNSTICO Esta afeccin se diagnostica en funcin de lo siguiente:  Antecedentes mdicos, sntomas y un examen fsico.  Las pruebas de diagnstico pueden incluir: ? Science writer. ? Electrocardiograma (ECG). ? Radiografa de trax. ? Anlisis de [place]. ? Prueba de esfuerzo. ? Gammagrafa. ? Cateterismo cardaco y angiografa. TRATAMIENTO El tratamiento de esta afeccin est destinado al control de los sntomas de la insuficiencia cardaca. Podr ser necesario que tome medicamentos, realice cambios en su estilo de vida o siga otros tratamientos para tratar la insuficiencia cardaca. Medicamentos Estos pueden incluir lo siguiente:  Inhibidores de la enzima convertidora de  angiotensina (IECA). Este tipo de  medicamento bloquea los efectos de una protena llamada enzima convertidora de angiotensina. Los inhibidores de la ECA Banker (dilatan) los vasos sanguneos y ayudan a Technical sales engineer la presin arterial.  Bloqueadores de los receptores de angiotensina [place]). Este tipo de medicamento bloquea las acciones de una protena de la sangre llamada angiotensina. Los bloqueadores de los receptores de angiotensina dilatan los vasos sanguneos y [place] a reducir la presin arterial.  Medicamentos para eliminar los lquidos (diurticos). Los diurticos [employer] los riones eliminen la sal y el agua de la [place]. Esta eliminacin del exceso de lquido a travs de la orina, disminuye el volumen de sangre que el corazn bombea.  Betabloqueantes. Los betabloqueantes evitan que el corazn palpite demasiado rpido y mejoran la fuerza del msculo cardaco.  Digoxina. Aumentan la fuerza de los latidos cardacos. Cambios hacia un estilo de vida saludable Estos pueden incluir lo siguiente:  Barista y Pharmacologist un peso saludable.  Dejar de fumar o mascar tabaco.  Consumir alimentos cardiosaludables.  Limitar o evitar el alcohol.  Dejar de consumir drogas ilegales.  Realizar actividad fsica. Otros tratamientos Estos pueden incluir lo siguiente:  Neomia Dear ciruga para abrir las arterias obstruidas o reparar las vlvulas cardacas daadas.  Colocacin de un marcapasos biventricular para mejorar la funcin del msculo cardaco (tratamiento de resincronizacin cardaca). Este dispositivo marca el paso tanto para el ventrculo derecho como para el izquierdo.  Implantacin de un dispositivo para tratar problemas graves del ritmo cardaco (desfibrilador cardioversor implantable o DCI).  Colocacin de un dispositivo para mejorar la capacidad de bombeo del corazn (dispositivo de asistencia ventricular izquierda o DAVI).  Trasplante cardaco. Este puede curar la insuficiencia cardaca y se  lo considera para ciertos pacientes que no mejoran con otros tratamientos. INSTRUCCIONES PARA EL CUIDADO EN EL HOGAR Medicamentos  [employer] de venta libre y los recetados solamente como se lo haya indicado el mdico. [place] medicamentos son importantes para reducir la carga de trabajo del corazn, disminuir la progresin de la insuficiencia cardaca y [employer] sntomas. ? No deje de tomar los medicamentos, excepto si se lo indica su mdico. ? No se saltee ninguna dosis de los medicamentos. ? Pida una nueva receta antes de quedarse sin medicamentos. Es necesario que tome sus medicamentos todos [place]. Comida y bebida  Consuma alimentos cardiosaludables. Hable con un nutricionista para crear un plan de alimentacin que sea adecuado para usted. ? Opte por una dieta que no contenga grasas trans y que sea baja en grasas saturadas y colesterol. Las opciones saludables incluyen frutas y verduras frescas o congeladas, pescado, carnes [place], legumbres, productos lcteos descremados o bajos en grasa y cereales integrales o alimentos con alto contenido de [place]. ? Limite el consumo de sal (sodio) si se lo indica su mdico. La restriccin de sodio puede reducir los sntomas de insuficiencia cardaca. Hable con un nutricionista para aprender ms sobre los condimentos cardiosaludables. ? Use mtodos de coccin saludables en lugar de frer. Los mtodos de coccin saludables incluyen asar, Software engineer, hervir, Development worker, community, cocer al vapor o Actor.  Limite el consumo de lquidos si el mdico se lo indica. La restriccin de lquidos puede reducir los sntomas de insuficiencia cardaca. Estilo de vida  Deje de fumar o Theatre manager tabaco. El tabaco y la nicotina pueden daar el corazn y los vasos sanguneos. No utilice chicles ni parches de nicotina antes de hablar con su mdico.  Limite el consumo de alcohol a no ms de 1 medida por da si es mujer y no est [place],  y 2 medidas por da si es hombre. Una  medida equivale a 12onzas de cerveza, 5onzas de vino o 1onzas de bebidas alcohlicas de alta graduacin. ? Beber ms puede ser daino para el corazn. Informe a su mdico si bebe alcohol varias veces a la semana. ? Hable con su mdico acerca de si el alcohol es seguro para usted. ? Si el corazn ya ha sufrido un dao por el alcohol o sufre una insuficiencia cardaca grave, debe dejar de consumirlo completamente.  Deje de consumir drogas ilegales.  Pierda peso si se lo indica su mdico. La prdida de peso puede reducir los sntomas de insuficiencia cardaca.  Haga actividad fsica moderada si se lo indica su mdico. Los ancianos y las personas con insuficiencia cardaca grave deben consultarle a su mdico qu actividades fsicas son recomendables para ellos. Control de informacin importante  Controle su peso a diario. Controlar su peso a diario le ayuda a Engineer, manufacturing un exceso de lquido ms rpidamente. ? Hgalo todas las maanas despus de orinar y antes de Engineer, maintenance. ? Use la misma ropa cada vez que se pese. ? Anote su [employer]. Lleve a su mdico el registro de [address].  Debe controlar y Passenger transport manager su presin arterial segn las indicaciones del mdico.  Controle su pulso como se lo haya indicado el mdico. Cmo sobrellevar las temperaturas extremas  Si el clima es extremadamente caluroso: ? Evite la actividad fsica intensa. ? Use el aire acondicionado o los ventiladores o busque un lugar ms fresco. ? Evite la cafena y el alcohol. ? Use ropa holgada, ligera y de colores claros.  Si el clima es extremadamente fro: ? Evite la actividad fsica intensa. ? Vstase con ropa en capas. ? Use mitones o guantes, un sombrero y [place] bufanda cuando salga. ? Evite el alcohol. Instrucciones generales  Controle otros problemas de salud, como hipertensin, diabetes, enfermedad de la tiroides o ritmos cardacos anormales segn las indicaciones de su mdico.  Aprenda a Sports administrator. Si necesita ayuda para lograrlo, consulte al mdico.  Planifique perodos de descanso cuando se sienta fatigado.  Reciba asesoramiento y apoyo si lo necesita.  Busque programas de rehabilitacin y participe en ellos para mantener o mejorar su independencia y su calidad de vida.  Mantngase al da con las vacunas. Esto es especialmente importante para prevenir las infecciones respiratorias con vacunas actualizadas contra el neumococo y la gripe.  Concurra a todas las visitas de control como se lo haya indicado el mdico. Esto es importante. SOLICITE ATENCIN MDICA SI:  Aumenta de peso rpidamente.  Nota que le falta el aire y esto no es habitual en usted.  No puede participar en sus actividades fsicas habituales.  Se cansa con facilidad.  Tose ms de lo normal, especialmente al realizar actividad fsica.  Observa que sus manos, pies, tobillos o abdomen se le hinchan o estn ms hinchados que lo habitual.  Le cuesta dormir debido a que Engineer, manufacturing systems.  Siente que el corazn late rpido (palpitaciones).  Siente mareos o sensacin de desvanecimiento al ponerse de pie.  SOLICITE ATENCIN MDICA DE INMEDIATO SI:  Tiene dificultad para respirar.  Nota, o su familia nota, cambios en su estado de alerta, por ejemplo, tener problemas para permanecer despierto o tener dificultades para concentrarse.  Siente dolor o International aid/development worker.  Se desmaya una vez (sncope).  Esta informacin no tiene Theme park manager el consejo del mdico. Asegrese de hacerle al mdico cualquier pregunta que  tenga. Document Released: 08/23/2005 Document Revised: 12/15/2015 Document Reviewed: 03/17/2016 Elsevier Interactive Patient Education  2017 [employer].

## 2017-05-04 ENCOUNTER — Telehealth: Payer: Self-pay | Admitting: Family

## 2017-05-04 ENCOUNTER — Ambulatory Visit: Payer: Self-pay | Admitting: Family

## 2017-05-04 NOTE — Telephone Encounter (Signed)
Patient missed his initial appointment [facility] on 05[facility]04[facility]. Will attempt to reschedule.

## 2017-07-07 DEATH — deceased

## 2018-08-01 IMAGING — CT CT HEAD W/O CM
3 series · 16 of 46 positions shown, 19 images · non-contrast
Comparison: 04/12/2016

CLINICAL DATA: Syncope.

Initial encounter.
EXAM:
CT HEAD WITHOUT CONTRAST
TECHNIQUE: Contiguous axial images were obtained from the base of the skull
through the vertex without intravenous contrast.

[Series 2: head wo · axial · 0.42mm/px · z∈[-48,+72]mm · 10 of 29 slices shown, 13 images]
[im 3/29  brain]
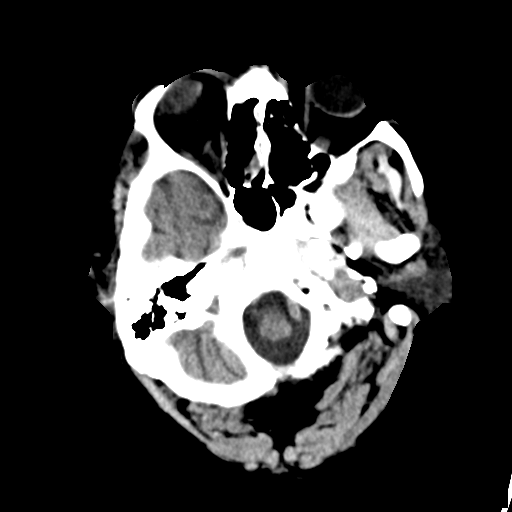
[im 3/29  bone]
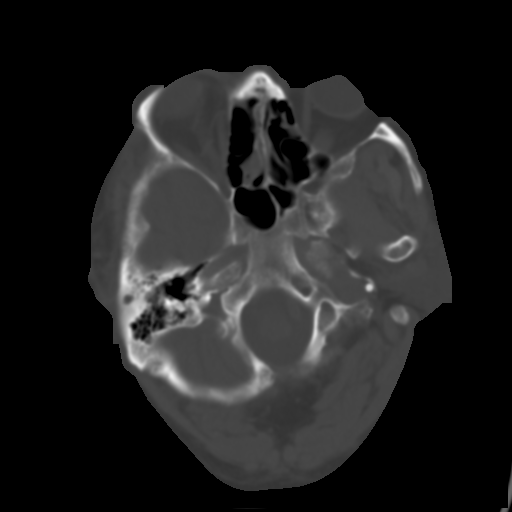
[im 6/29  brain]
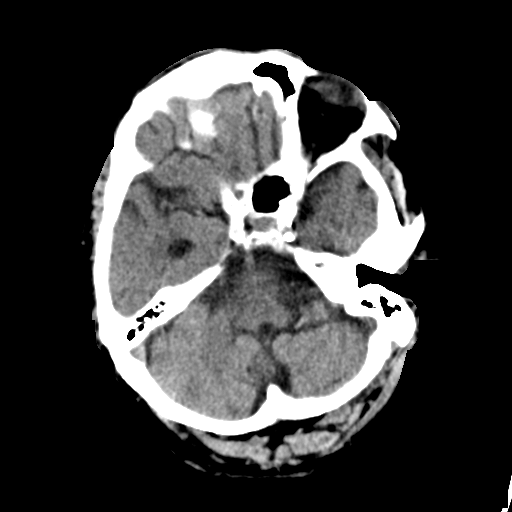
[im 8/29  brain]
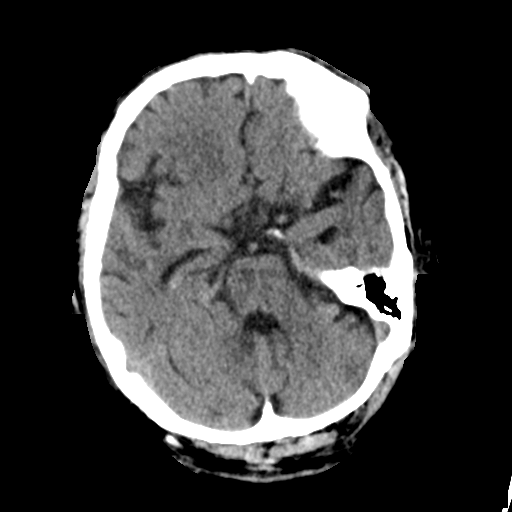
[im 11/29  brain]
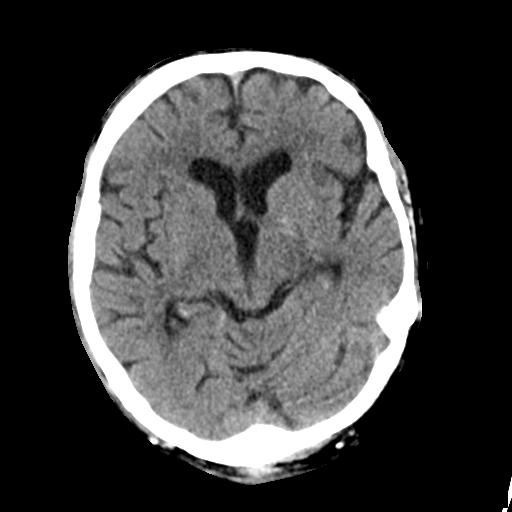
[im 14/29  brain]
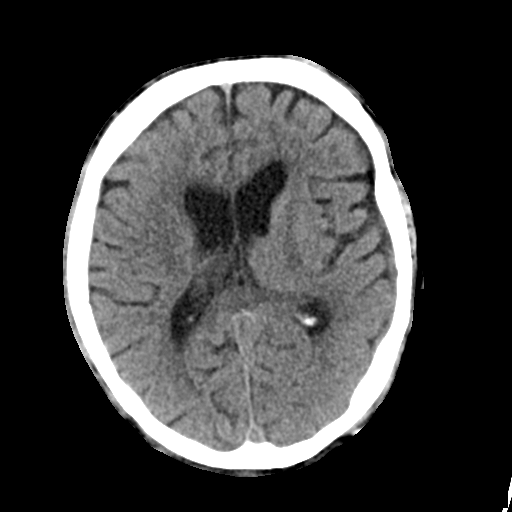
[im 14/29  bone]
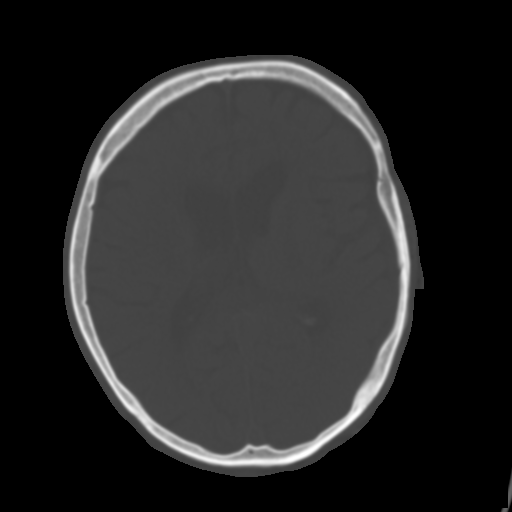
[im 16/29  brain]
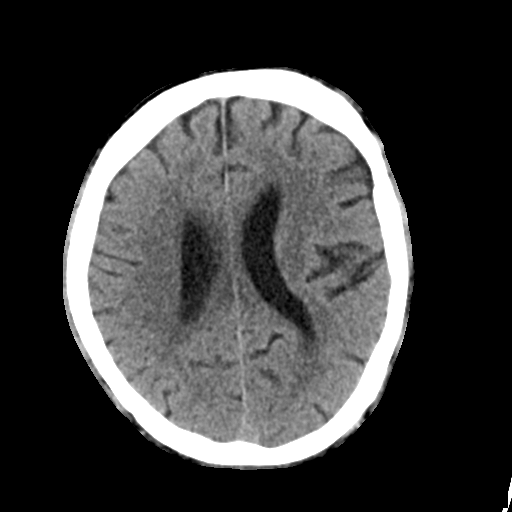
[im 19/29  brain]
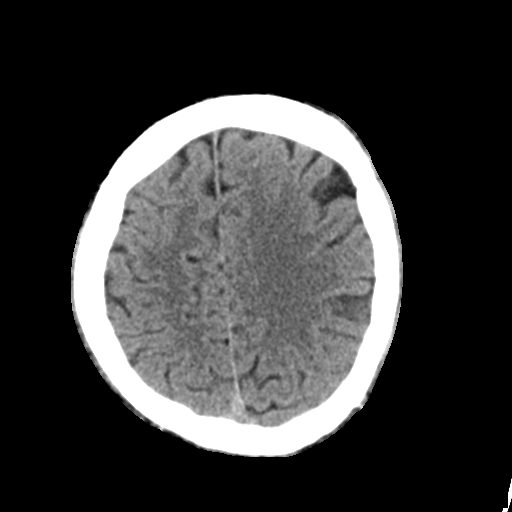
[im 22/29  brain]
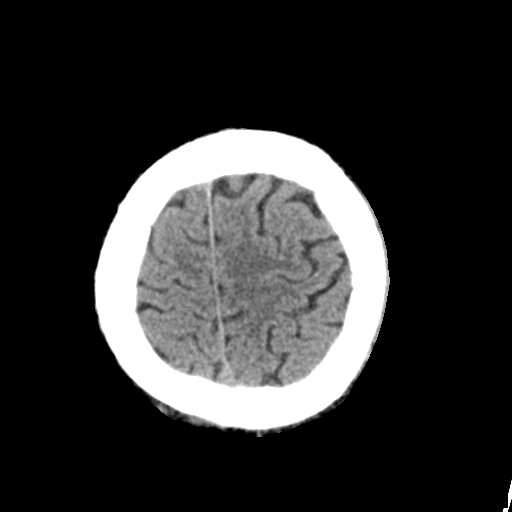
[im 24/29  brain]
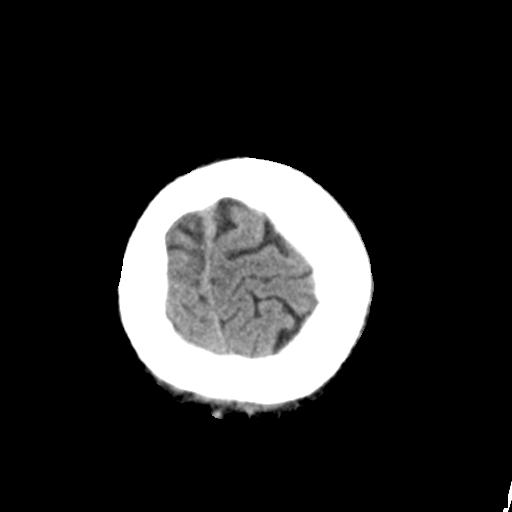
[im 24/29  bone]
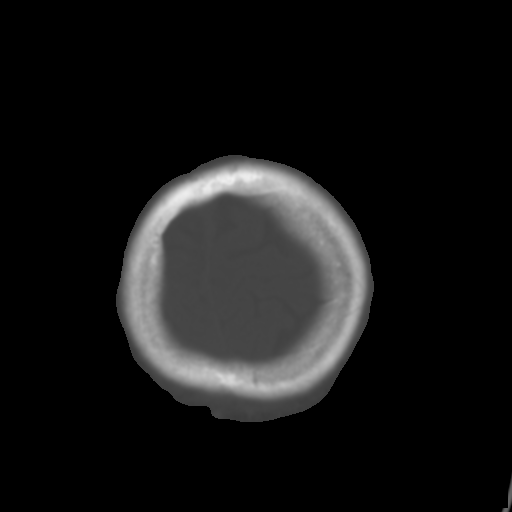
[im 27/29  brain]
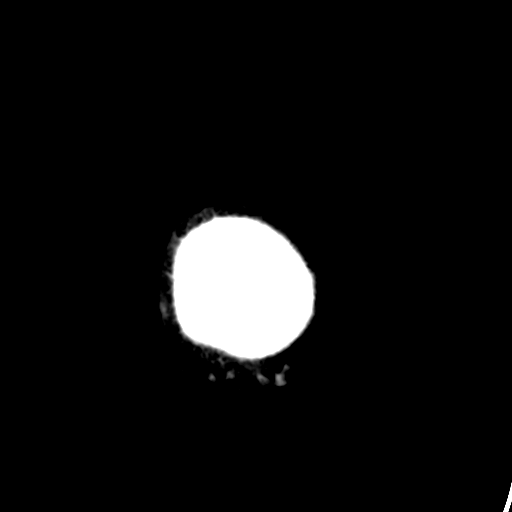

[Series 4: coronal soft tissue · coronal · 0.33mm/px · 3 of 62 slices shown]
[im 21/62  brain]
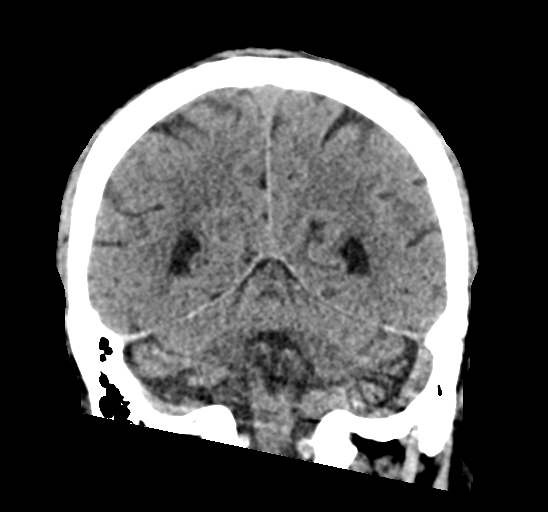
[im 28/62  brain]
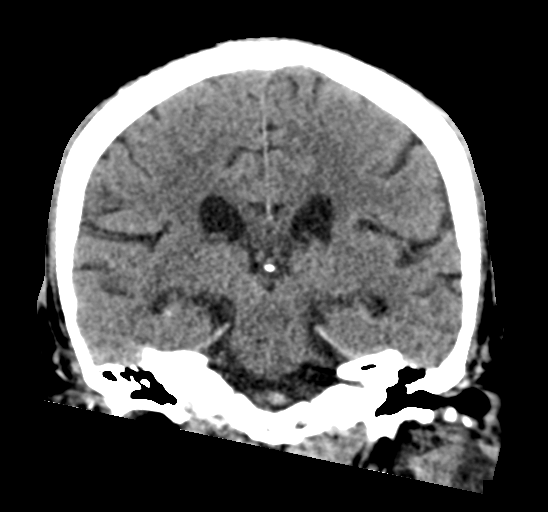
[im 34/62  brain]
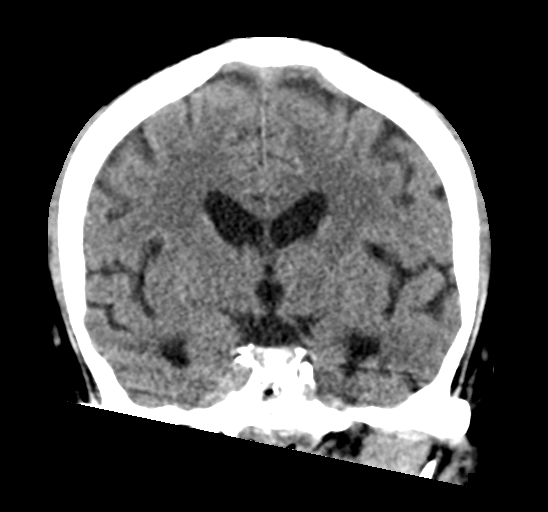

[Series 5: sagittal soft tissue · sagittal · 0.33mm/px · 3 of 57 slices shown]
[im 24/57  brain]
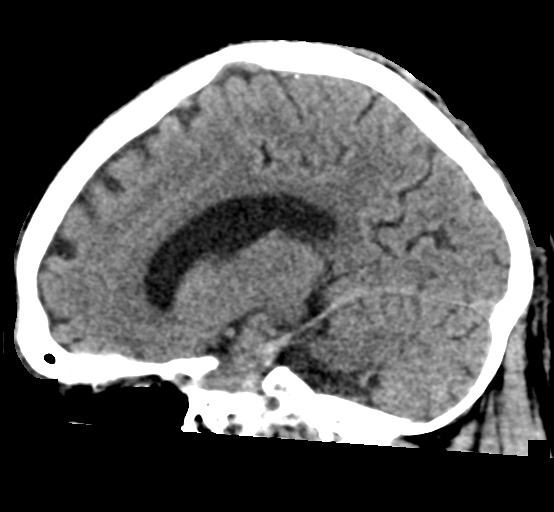
[im 29/57  brain]
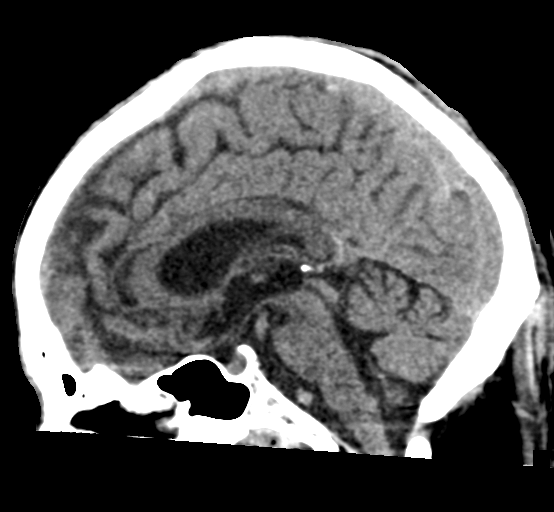
[im 34/57  brain]
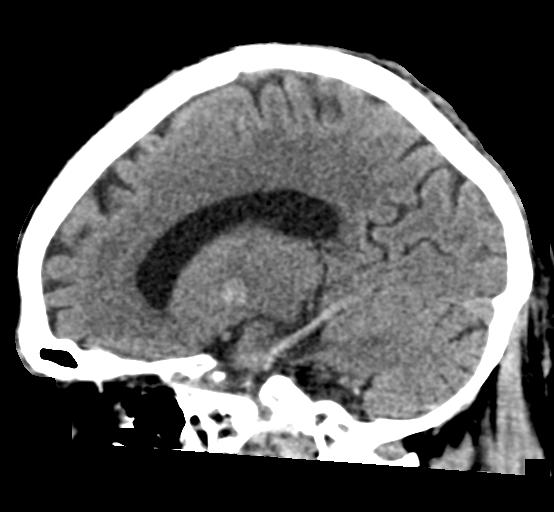

[16 of 46 positions shown; findings below may reference images not displayed]

FINDINGS: Brain: No evidence of acute infarction, hemorrhage, hydrocephalus,
extra-axial collection or mass lesion/mass effect. Mild cerebral
volume loss, stable.

Vascular: Atherosclerotic calcification.  No acute finding.

Skull: Chronic scattered scalp thickening. No definitive contusion.
Negative for skull fracture.

Sinuses/Orbits: No evidence of injury.
IMPRESSION: No acute finding or change from prior.

## 2018-11-28 IMAGING — CR DG CHEST 2V
1 series · 2 of 2 positions shown · non-contrast
Comparison: March 17, 2017

CLINICAL DATA: Pain after dialysis.

EXAM:
CHEST  2 VIEW

[Series 1: dg chest 2 view · 0.14mm/px · 2 of 2 slices shown]
[im 1/2]
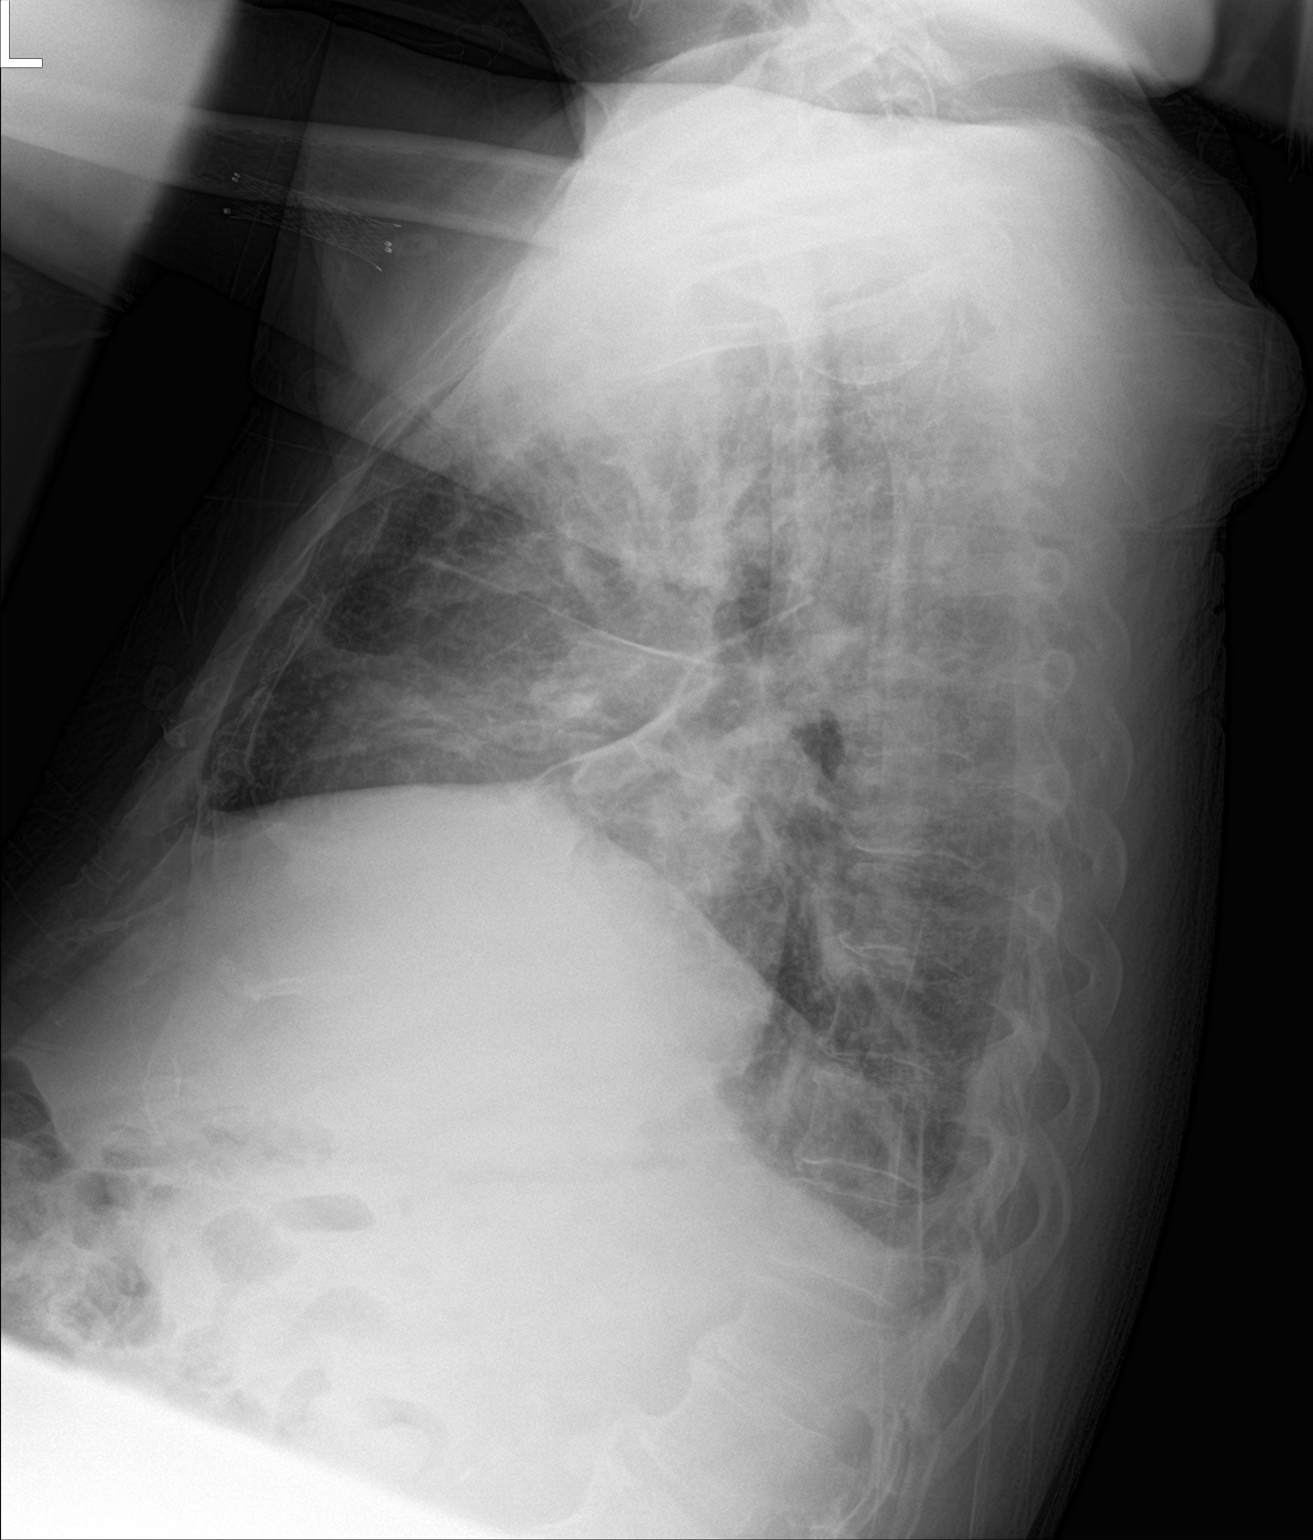
[im 2/2]
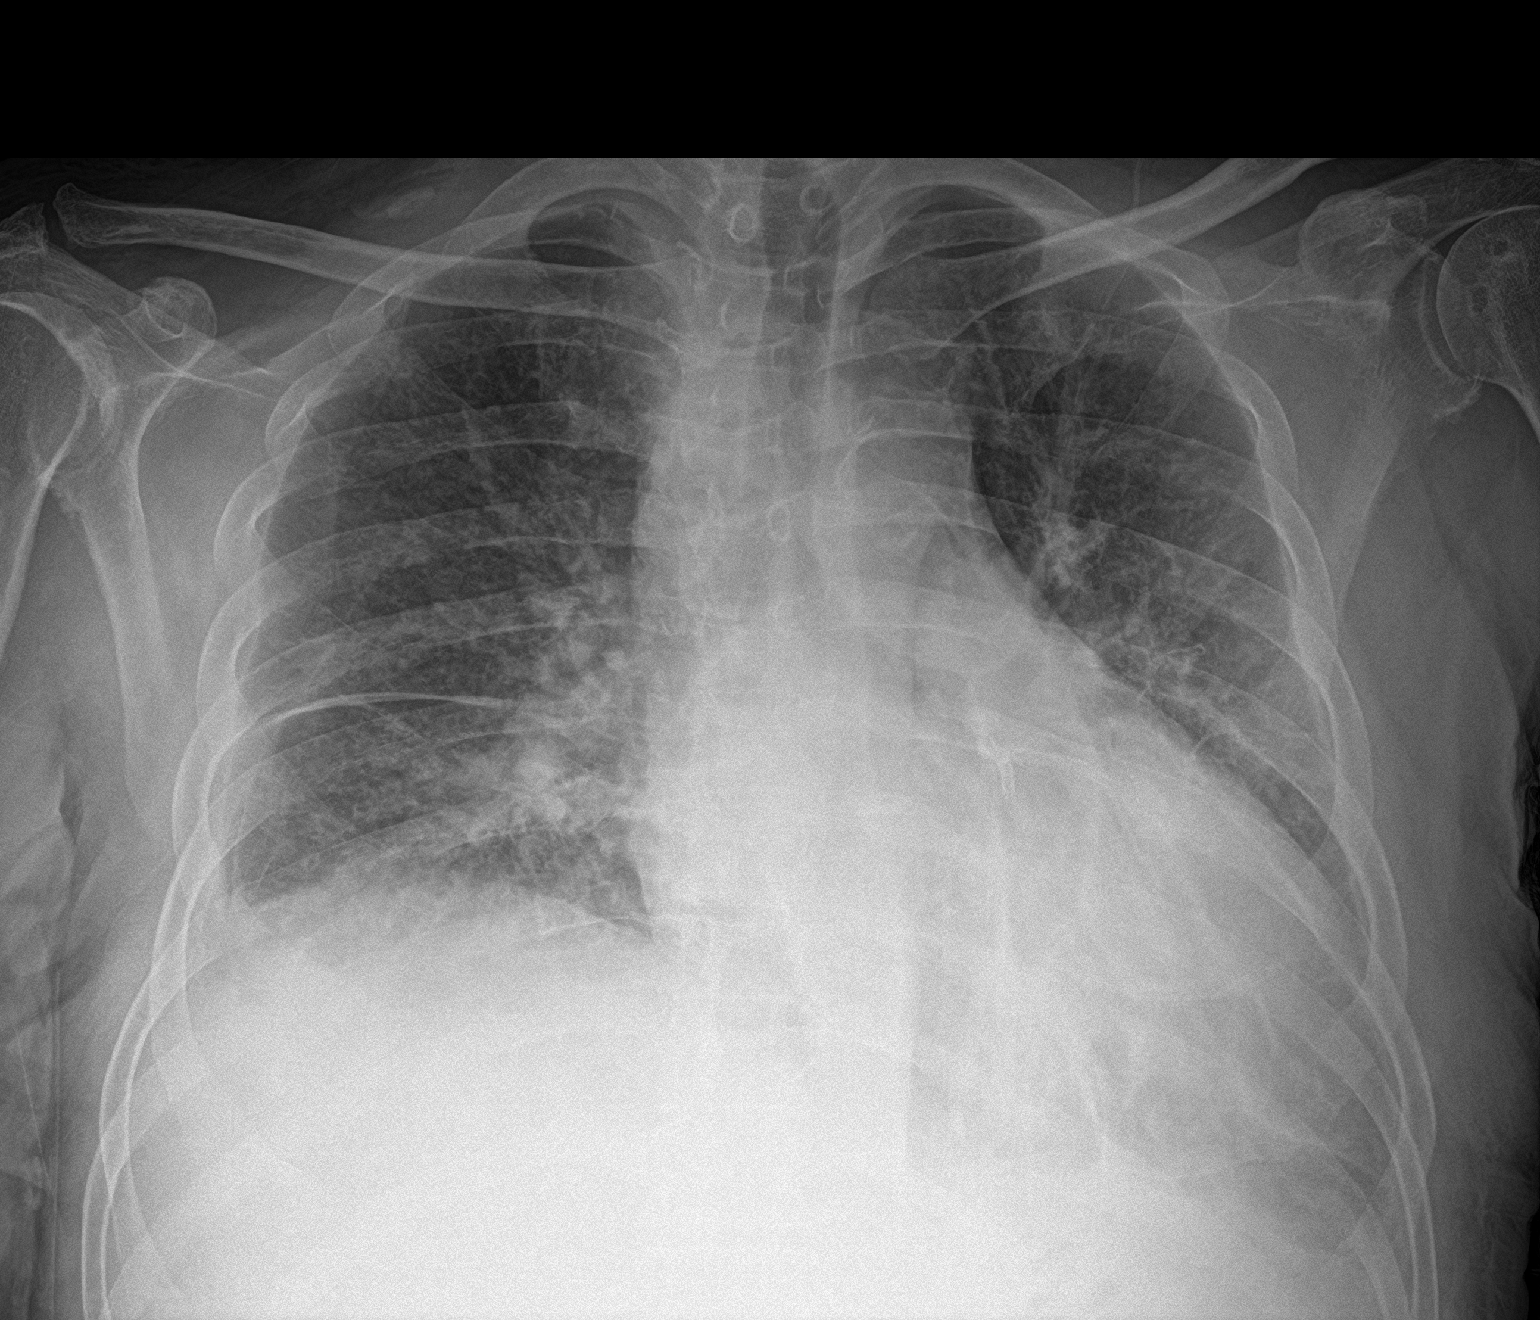

[2 of 2 positions shown; findings below may reference images not displayed]

FINDINGS: Stable cardiomegaly. Streaky opacity in left retrocardiac region on
the frontal view only is likely atelectasis. Increased interstitial
markings suggest edema. There are probably tiny effusions. No
nodules or masses. No acute infiltrates.
IMPRESSION: Probable tiny effusions and edema. Streaky opacity in left
retrocardiac region is likely atelectasis.

## 2019-02-09 IMAGING — US US EXTREM LOW VENOUS BILAT
1 series · 12 of 24 positions shown · non-contrast
Comparison: None.

CLINICAL DATA: Bilateral leg pain for 2 weeks. Pain occurs while
having dialysis.



[Series 1: us extrem low venous bilat · 12 of 78 slices shown]
[im 4/78]
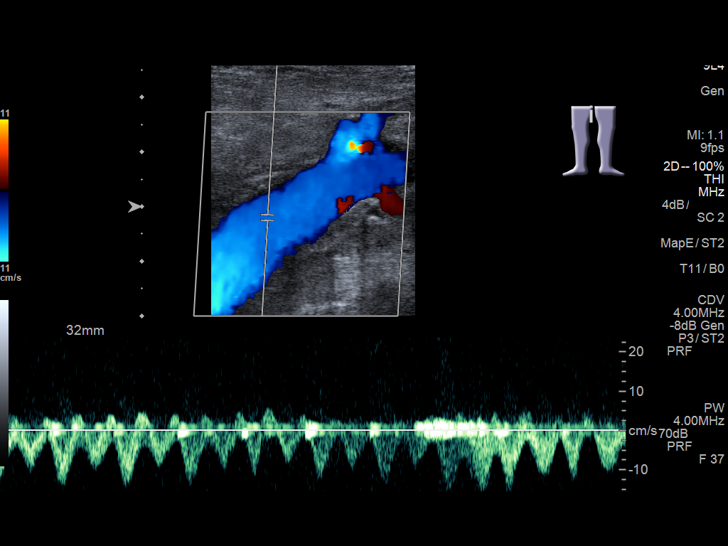
[im 11/78]
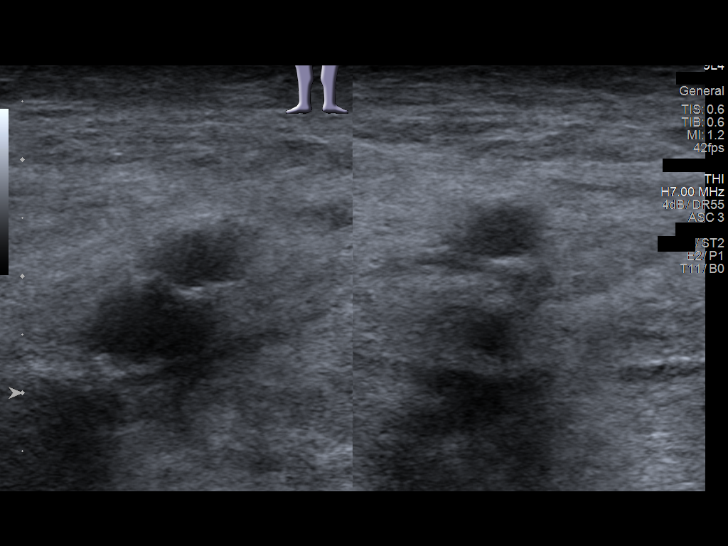
[im 17/78]
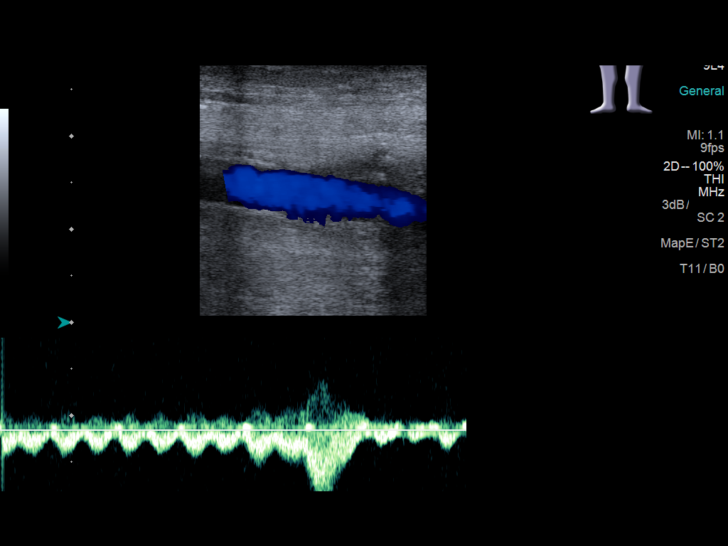
[im 24/78]
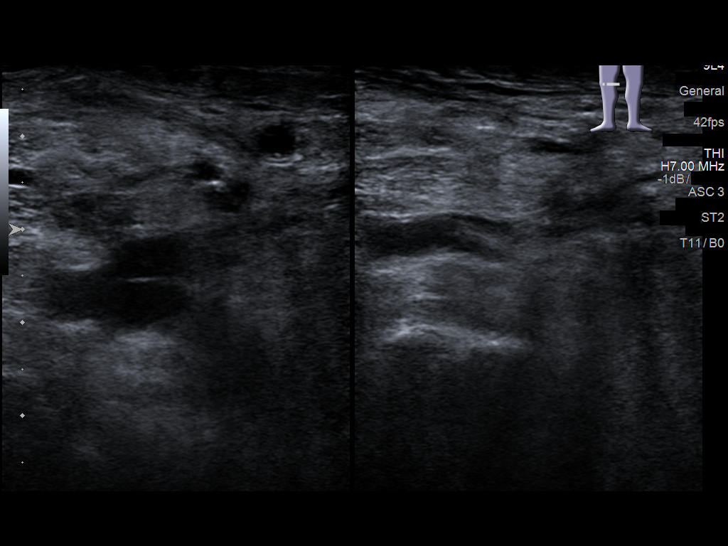
[im 31/78]
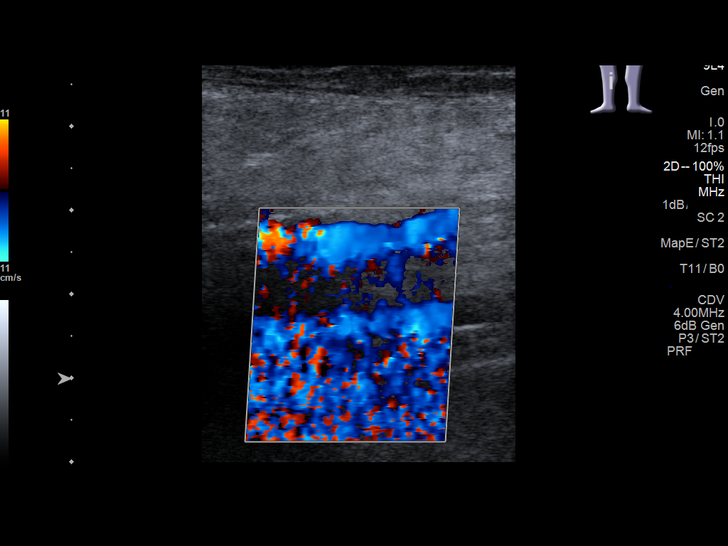
[im 37/78]
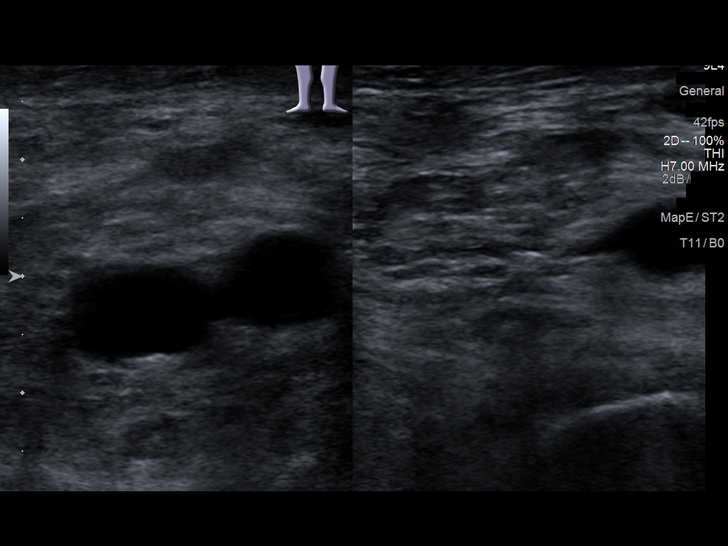
[im 44/78]
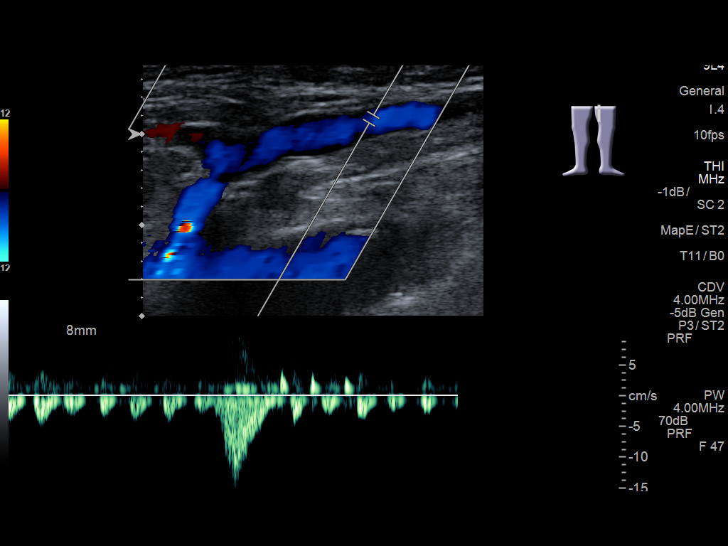
[im 51/78]
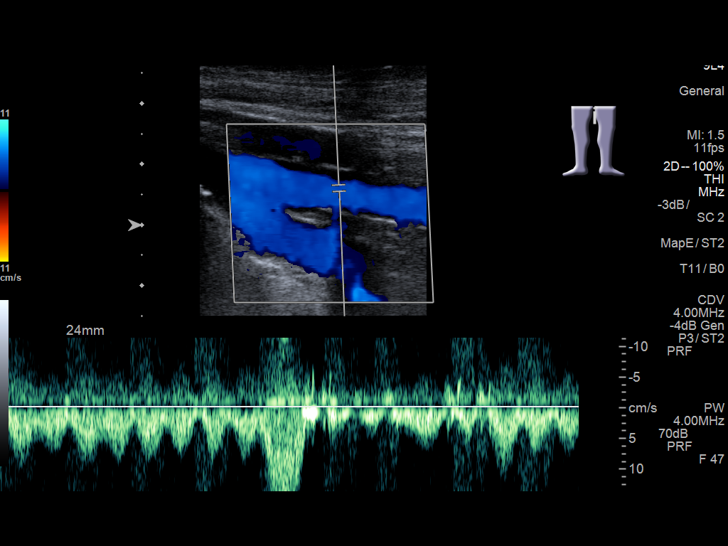
[im 57/78]
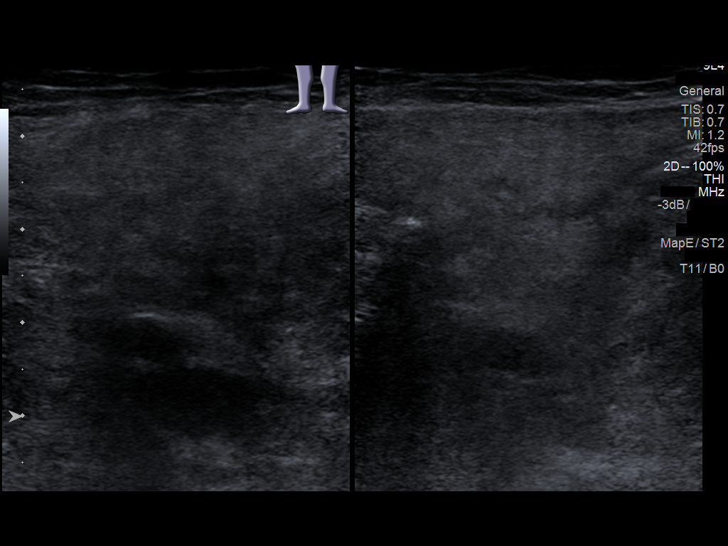
[im 64/78]
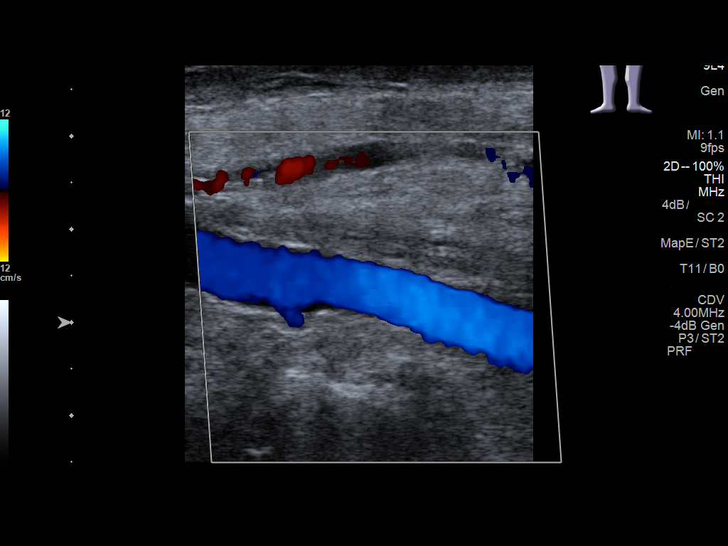
[im 71/78]
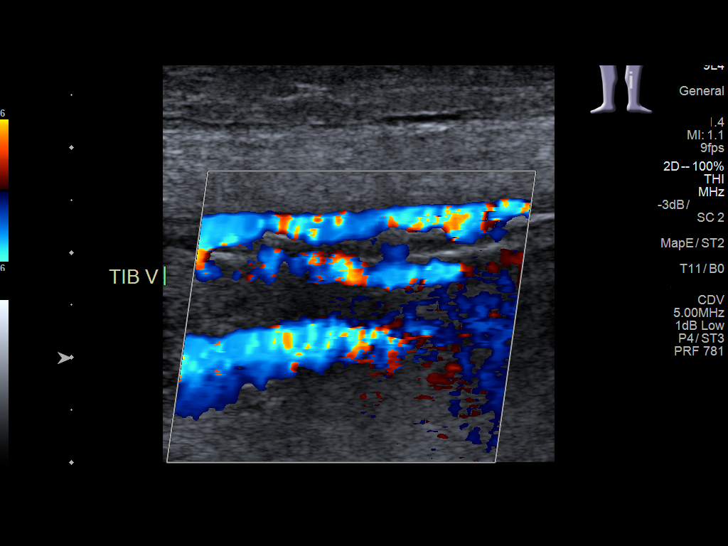
[im 78/78]
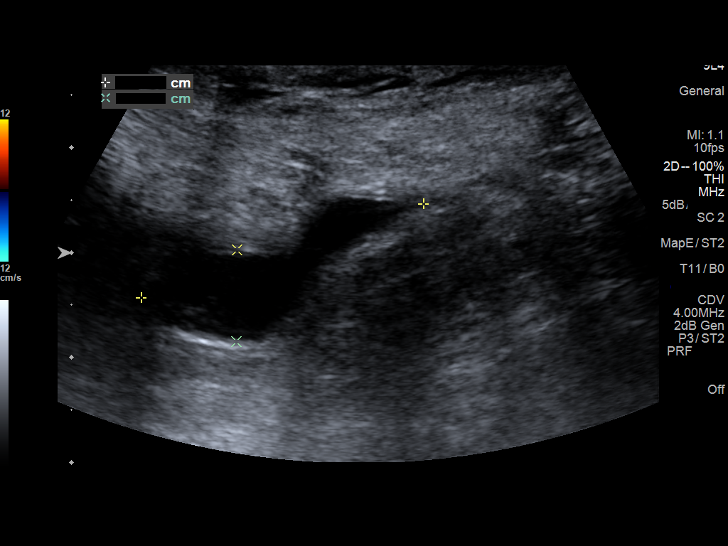

[12 of 24 positions shown; findings below may reference images not displayed]

FINDINGS: RIGHT LOWER EXTREMITY

Common Femoral Vein: No evidence of thrombus. Normal
compressibility, respiratory phasicity and response to augmentation.

Saphenofemoral Junction: No evidence of thrombus. Normal
compressibility and flow on color Doppler imaging.

Profunda Femoral Vein: No evidence of thrombus. Normal
compressibility and flow on color Doppler imaging.

Femoral Vein: No evidence of acute thrombus. Normal compressibility,
respiratory phasicity and response to augmentation. Small peripheral
echogenic areas in the mid femoral vein are most compatible with
calcifications and could be related to remote DVT.

Popliteal Vein: No evidence of thrombus. Normal compressibility,
respiratory phasicity and response to augmentation.

Calf Veins: No evidence of thrombus. Normal compressibility and flow
on color Doppler imaging.

Other Findings: Elongated fluid collection in the right popliteal
fossa measures 4.1 x 1.0 x 2.8 cm. No significant vascular flow
within this cystic structure. Findings are compatible with a Baker's
cyst.

LEFT LOWER EXTREMITY

Common Femoral Vein: No evidence of thrombus. Normal
compressibility, respiratory phasicity and response to augmentation.

Saphenofemoral Junction: No evidence of thrombus. Normal
compressibility and flow on color Doppler imaging.

Profunda Femoral Vein: No evidence of thrombus. Normal
compressibility and flow on color Doppler imaging.

Femoral Vein: No evidence of thrombus. Normal compressibility,
respiratory phasicity and response to augmentation. Peripheral
echoes in the mid left femoral vein probably represent
calcifications.

Popliteal Vein: No evidence of thrombus. Normal compressibility,
respiratory phasicity and response to augmentation.

Calf Veins: No evidence of thrombus. Normal compressibility and flow
on color Doppler imaging.

Other Findings: Small cystic collection in the left popliteal fossa.
Structure measures 2.2 x 0.9 x 2.8 cm.
IMPRESSION: No evidence for an acute DVT in the lower extremities.

Small peripheral calcifications and echogenic structures in the
femoral veins bilaterally. Findings most likely related to prior or
chronic DVTs.

Bilateral Baker's cysts.
